# Patient Record
Sex: Male | Born: 1945 | Race: White | Hispanic: No | Marital: Married | Smoking: Never smoker
Health system: Southern US, Community
[De-identification: ages and names within clinical notes are randomized; demographics above are authoritative.]

## PROBLEM LIST (undated history)

## (undated) DIAGNOSIS — Z87442 Personal history of urinary calculi: Secondary | ICD-10-CM

## (undated) DIAGNOSIS — I2699 Other pulmonary embolism without acute cor pulmonale: Secondary | ICD-10-CM

## (undated) DIAGNOSIS — I729 Aneurysm of unspecified site: Secondary | ICD-10-CM

## (undated) DIAGNOSIS — I719 Aortic aneurysm of unspecified site, without rupture: Secondary | ICD-10-CM

## (undated) DIAGNOSIS — N289 Disorder of kidney and ureter, unspecified: Secondary | ICD-10-CM

## (undated) DIAGNOSIS — K219 Gastro-esophageal reflux disease without esophagitis: Secondary | ICD-10-CM

## (undated) DIAGNOSIS — G8929 Other chronic pain: Secondary | ICD-10-CM

## (undated) DIAGNOSIS — N400 Enlarged prostate without lower urinary tract symptoms: Secondary | ICD-10-CM

## (undated) DIAGNOSIS — F32A Depression, unspecified: Secondary | ICD-10-CM

## (undated) DIAGNOSIS — R0789 Other chest pain: Secondary | ICD-10-CM

## (undated) DIAGNOSIS — G4730 Sleep apnea, unspecified: Secondary | ICD-10-CM

## (undated) DIAGNOSIS — G2581 Restless legs syndrome: Secondary | ICD-10-CM

## (undated) DIAGNOSIS — M1990 Unspecified osteoarthritis, unspecified site: Secondary | ICD-10-CM

## (undated) DIAGNOSIS — R600 Localized edema: Secondary | ICD-10-CM

## (undated) DIAGNOSIS — F419 Anxiety disorder, unspecified: Secondary | ICD-10-CM

## (undated) DIAGNOSIS — E291 Testicular hypofunction: Secondary | ICD-10-CM

## (undated) DIAGNOSIS — I82409 Acute embolism and thrombosis of unspecified deep veins of unspecified lower extremity: Secondary | ICD-10-CM

## (undated) DIAGNOSIS — G4700 Insomnia, unspecified: Secondary | ICD-10-CM

## (undated) DIAGNOSIS — E785 Hyperlipidemia, unspecified: Secondary | ICD-10-CM

## (undated) DIAGNOSIS — R0602 Shortness of breath: Secondary | ICD-10-CM

## (undated) DIAGNOSIS — I10 Essential (primary) hypertension: Secondary | ICD-10-CM

## (undated) DIAGNOSIS — F329 Major depressive disorder, single episode, unspecified: Secondary | ICD-10-CM

## (undated) HISTORY — DX: Shortness of breath: R06.02

## (undated) HISTORY — DX: Hyperlipidemia, unspecified: E78.5

## (undated) HISTORY — DX: Disorder of kidney and ureter, unspecified: N28.9

## (undated) HISTORY — PX: INGUINAL HERNIA REPAIR: SUR1180

## (undated) HISTORY — DX: Benign prostatic hyperplasia without lower urinary tract symptoms: N40.0

## (undated) HISTORY — PX: KIDNEY STONE SURGERY: SHX686

## (undated) HISTORY — PX: ROTATOR CUFF REPAIR: SHX139

## (undated) HISTORY — DX: Gastro-esophageal reflux disease without esophagitis: K21.9

## (undated) HISTORY — DX: Essential (primary) hypertension: I10

## (undated) HISTORY — DX: Localized edema: R60.0

## (undated) HISTORY — DX: Testicular hypofunction: E29.1

## (undated) HISTORY — DX: Other chest pain: R07.89

## (undated) HISTORY — DX: Insomnia, unspecified: G47.00

---
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
## 2008-12-01 ENCOUNTER — Ambulatory Visit (HOSPITAL_COMMUNITY): Admission: RE | Admit: 2008-12-01 | Discharge: 2008-12-01 | Payer: Self-pay | Admitting: Urology

## 2011-08-23 ENCOUNTER — Other Ambulatory Visit (HOSPITAL_COMMUNITY): Payer: Self-pay | Admitting: Urology

## 2011-08-23 ENCOUNTER — Ambulatory Visit (HOSPITAL_COMMUNITY)
Admission: RE | Admit: 2011-08-23 | Discharge: 2011-08-23 | Disposition: A | Discharge status: Home / Self Care | Payer: BC Managed Care – PPO | Source: Ambulatory Visit | Attending: Urology | Admitting: Urology

## 2011-08-23 DIAGNOSIS — R1031 Right lower quadrant pain: Secondary | ICD-10-CM | POA: Insufficient documentation

## 2011-08-23 DIAGNOSIS — R109 Unspecified abdominal pain: Secondary | ICD-10-CM

## 2016-03-15 DIAGNOSIS — M25473 Effusion, unspecified ankle: Secondary | ICD-10-CM

## 2016-03-15 DIAGNOSIS — R0602 Shortness of breath: Secondary | ICD-10-CM | POA: Insufficient documentation

## 2016-03-15 DIAGNOSIS — I10 Essential (primary) hypertension: Secondary | ICD-10-CM | POA: Insufficient documentation

## 2016-03-15 DIAGNOSIS — R0789 Other chest pain: Secondary | ICD-10-CM | POA: Insufficient documentation

## 2016-03-20 ENCOUNTER — Ambulatory Visit (INDEPENDENT_AMBULATORY_CARE_PROVIDER_SITE_OTHER): Payer: Medicare Other | Admitting: Cardiovascular Disease

## 2016-03-20 ENCOUNTER — Encounter: Payer: Self-pay | Admitting: Cardiovascular Disease

## 2016-03-20 VITALS — BP 130/78 | HR 74 | Ht 68.0 in | Wt 189.0 lb

## 2016-03-20 DIAGNOSIS — M79605 Pain in left leg: Secondary | ICD-10-CM | POA: Diagnosis not present

## 2016-03-20 DIAGNOSIS — R0789 Other chest pain: Secondary | ICD-10-CM | POA: Diagnosis not present

## 2016-03-20 DIAGNOSIS — R0600 Dyspnea, unspecified: Secondary | ICD-10-CM

## 2016-03-20 DIAGNOSIS — R600 Localized edema: Secondary | ICD-10-CM | POA: Diagnosis not present

## 2016-03-20 NOTE — Progress Notes (Addendum)
Cardiology Office Note    Date:  03/20/2016   ID:  TYTAN GARVEN, DOB 1946-04-12, MRN [id number]  PCP:  Ephriam Jenkins E  Cardiologist:   Jenkins Rouge, MD   No chief complaint on file.   History of Present Illness:  Adrian Franco is a 70 y.o. male referred for chest pain.  Seen by primary 03/13/16 complained of fatigue , dyspnea and atypical chest pain.  For 3 months less energy. Getting testosterone shots levels not checked recently but shots every 2 weeks. Dyspnea going up steps ? 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 Pain in center of chest Not always exertional Lasts minutes Occurs 3-4 x/week.  Both parents have CAD with CABG.  Also sees Dr Jerry Caras for back problems.  Has never has stress test or cardiac w/u   Pain and swelling in LLE with band like cessation behind knee No antecedent claudication. Some cramps in legs     Past Medical History:  Diagnosis Date  . Atypical chest pain   . BPH (benign prostatic hypertrophy)   . GERD (gastroesophageal reflux disease)   . HTN (hypertension)   . Hyperlipemia   . Hypogonadism in male   . Insomnia   . Localized edema   . Renal insufficiency   . SOB (shortness of breath)   . Testicular hypofunction     Past Surgical History:  Procedure Laterality Date  . INGUINAL HERNIA REPAIR      Current Medications: Outpatient Medications Prior to Visit  Medication Sig Dispense Refill  . fenofibrate 160 MG tablet Take 160 mg by mouth daily.    Marland Kitchen losartan (COZAAR) 100 MG tablet Take 100 mg by mouth daily.    Marland Kitchen omeprazole (PRILOSEC) 40 MG capsule Take 40 mg by mouth daily.    . ropinirole (REQUIP) 5 MG tablet Take 5 mg by mouth at bedtime.    . simvastatin (ZOCOR) 20 MG tablet Take 20 mg by mouth daily.    . tamsulosin (FLOMAX) 0.4 MG CAPS capsule Take 0.4 mg by mouth.    . testosterone cypionate (DEPO-TESTOSTERONE) 200 MG/ML injection Inject into the muscle every 14 (fourteen) days.    . traMADol (ULTRAM) 50 MG tablet  Take 50 mg by mouth every 8 (eight) hours as needed.    . zolpidem (AMBIEN) 10 MG tablet Take 10 mg by mouth at bedtime as needed for sleep.    . meloxicam (MOBIC) 7.5 MG tablet Take 7.5 mg by mouth daily.    . sucralfate (CARAFATE) 1 g tablet Take 1 g by mouth 4 (four) times daily -  with meals and at bedtime.     No facility-administered medications prior to visit.      Allergies:   Amoxapine and related and Lotensin [benazepril hcl]   Social History   Social History  . Marital status: Married    Spouse name: N/A  . Number of children: N/A  . Years of education: N/A   Social History Main Topics  . Smoking status: Never Smoker  . Smokeless tobacco: Never Used  . Alcohol use No  . Drug use: No  . Sexual activity: Yes     Comment: married   Other Topics Concern  . None   Social History Narrative  . None     Family History:  The patient's family history includes Healthy in his brother, sister, son, and son; Hyperlipidemia in his father and mother; Other in his brother.   ROS:   Please see  the history of present illness.    ROS All other systems reviewed and are negative.   PHYSICAL EXAM:   VS:  BP 130/78   Pulse 74   Ht 5\' 8"  (1.727 m)   Wt 189 lb (85.7 kg)   SpO2 96%   BMI 28.74 kg/m    GEN: Well nourished, well developed, in no acute distress  HEENT: normal  Neck: no JVD, carotid bruits, or masses Cardiac: RRR; no murmurs, rubs, or gallops,no edema  Respiratory:  clear to auscultation bilaterally, normal work of breathing GI: soft, nontender, nondistended, + BS MS: no deformity or atrophy  Skin: warm and dry, no rash Neuro:  Alert and Oriented x 3, Strength and sensation are intact Psych: euthymic mood, full affect Plus one LLE edema with varicosities and fullness popliteal space   Wt Readings from Last 3 Encounters:  03/20/16 189 lb (85.7 kg)      Studies/Labs Reviewed:   EKG:   SR Insignificatn Q 3,F otherwise normal   Recent Labs: No results  found for requested labs within last 8760 hours.   Lipid Panel No results found for: CHOL, TRIG, HDL, CHOLHDL, VLDL, LDLCALC, LDLDIRECT  Additional studies/ records that were reviewed today include:  Notes from primary in [place] lasbs and CXR    ASSESSMENT:    1. Other chest pain   2. Acute pain of left lower extremity   3. Dyspnea   4. Edema of left lower extremity      PLAN:  In order of problems listed above:  1. Chest Pain  Atypical ECG ok f/u exercise myovue 2. Dyspnea  Normal exam f/u echo for RV/LV function estimate PA pressures 3. Low T f/u lab work primary continue supplements 4. HTN:  nlbp 5. CRF has stopped meloxicam wife will get [place] recent labs 6. Edema:  LLE check venous duplex r/o DVT venous insufficiency and bakers cyst    Medication Adjustments/Labs and Tests Ordered: Current medicines are reviewed at length with the patient today.  Concerns regarding medicines are outlined above.  Medication changes, Labs and Tests ordered today are listed in the Patient Instructions below. Patient Instructions  Medication Instructions:  Your physician recommends that you continue on your current medications as directed. Please refer to the Current Medication list given to you today.  Labwork: NONE  Testing/Procedures: Your physician has requested that you have en exercise stress myoview. For further information please visit [email or web address][email or web address]. Please follow instruction sheet, as given.  Your physician has requested that you have an echocardiogram. Echocardiography is a painless test that uses sound waves to create images of your heart. It provides your doctor with information about the size and shape of your heart and how well your heart's chambers and valves are working. This procedure takes approximately one hour. There are no restrictions for this procedure.  Your physician has requested that you have a lower extremity duplex to rule out DVT. Allow one hour for  this exam. There are no restrictions or special instructions.   Follow-Up: Your physician wants you to follow-up next available after test complete with [name].    If you need a refill on your cardiac medications before your next appointment, please call your pharmacy.     Reviewed labs from [place]:  Done 07/07/15 Cr was 1.48 with BUN 32  K 5.2 LDL 70 TC 162  Testosterone 210 (LLN 348)   CXR NAD   F/U labs 03/13/16 Cr 1.69 with BUN 24  K 4.8  LDL 102 TC 178 normal LFTls  Urine protein negative   Signed, Jenkins Rouge, MD  03/20/2016 5:05 PM    [facility] [address], [place], [place]  [address] Phone: [phone]; Fax: [phone]

## 2016-03-20 NOTE — Patient Instructions (Signed)
Medication Instructions:  Your physician recommends that you continue on your current medications as directed. Please refer to the Current Medication list given to you today.  Labwork: NONE  Testing/Procedures: Your physician has requested that you have en exercise stress myoview. For further information please visit [email or web address]. Please follow instruction sheet, as given.  Your physician has requested that you have an echocardiogram. Echocardiography is a painless test that uses sound waves to create images of your heart. It provides your doctor with information about the size and shape of your heart and how well your heart's chambers and valves are working. This procedure takes approximately one hour. There are no restrictions for this procedure.  Your physician has requested that you have a lower extremity duplex to rule out DVT. Allow one hour for this exam. There are no restrictions or special instructions.   Follow-Up: Your physician wants you to follow-up next available after test complete with [name].    If you need a refill on your cardiac medications before your next appointment, please call your pharmacy.

## 2016-03-21 ENCOUNTER — Encounter: Payer: Self-pay | Admitting: Cardiovascular Disease

## 2016-03-21 ENCOUNTER — Telehealth: Payer: Self-pay | Admitting: Cardiovascular Disease

## 2016-03-21 DIAGNOSIS — I351 Nonrheumatic aortic (valve) insufficiency: Secondary | ICD-10-CM

## 2016-03-27 ENCOUNTER — Inpatient Hospital Stay (HOSPITAL_COMMUNITY): Admission: RE | Admit: 2016-03-27 | Payer: 59 | Source: Ambulatory Visit

## 2016-03-30 ENCOUNTER — Inpatient Hospital Stay (HOSPITAL_COMMUNITY)
Admission: EM | Admit: 2016-03-30 | Discharge: 2016-03-31 | DRG: 176 | Disposition: A | Discharge status: Home / Self Care | Payer: Medicare Other | Attending: Student in an Organized Health Care Education/Training Program | Admitting: Student in an Organized Health Care Education/Training Program

## 2016-03-30 ENCOUNTER — Emergency Department (HOSPITAL_COMMUNITY): Payer: Medicare Other

## 2016-03-30 ENCOUNTER — Ambulatory Visit (HOSPITAL_COMMUNITY)
Admission: RE | Admit: 2016-03-30 | Discharge: 2016-03-30 | Disposition: A | Discharge status: Home / Self Care | Payer: Medicare Other | Source: Ambulatory Visit | Attending: Cardiovascular Disease | Admitting: Cardiovascular Disease

## 2016-03-30 ENCOUNTER — Encounter (HOSPITAL_COMMUNITY): Payer: Self-pay | Admitting: Emergency Medicine

## 2016-03-30 ENCOUNTER — Telehealth: Payer: Self-pay

## 2016-03-30 ENCOUNTER — Other Ambulatory Visit: Payer: Self-pay

## 2016-03-30 DIAGNOSIS — I10 Essential (primary) hypertension: Secondary | ICD-10-CM | POA: Diagnosis present

## 2016-03-30 DIAGNOSIS — Z7982 Long term (current) use of aspirin: Secondary | ICD-10-CM

## 2016-03-30 DIAGNOSIS — I82409 Acute embolism and thrombosis of unspecified deep veins of unspecified lower extremity: Secondary | ICD-10-CM

## 2016-03-30 DIAGNOSIS — M79605 Pain in left leg: Secondary | ICD-10-CM

## 2016-03-30 DIAGNOSIS — R0602 Shortness of breath: Secondary | ICD-10-CM | POA: Diagnosis present

## 2016-03-30 DIAGNOSIS — K219 Gastro-esophageal reflux disease without esophagitis: Secondary | ICD-10-CM | POA: Diagnosis present

## 2016-03-30 DIAGNOSIS — I82402 Acute embolism and thrombosis of unspecified deep veins of left lower extremity: Secondary | ICD-10-CM | POA: Diagnosis present

## 2016-03-30 DIAGNOSIS — Z79899 Other long term (current) drug therapy: Secondary | ICD-10-CM | POA: Diagnosis not present

## 2016-03-30 DIAGNOSIS — N400 Enlarged prostate without lower urinary tract symptoms: Secondary | ICD-10-CM | POA: Diagnosis present

## 2016-03-30 DIAGNOSIS — R600 Localized edema: Secondary | ICD-10-CM

## 2016-03-30 DIAGNOSIS — E785 Hyperlipidemia, unspecified: Secondary | ICD-10-CM | POA: Diagnosis present

## 2016-03-30 DIAGNOSIS — Z8249 Family history of ischemic heart disease and other diseases of the circulatory system: Secondary | ICD-10-CM

## 2016-03-30 DIAGNOSIS — Z888 Allergy status to other drugs, medicaments and biological substances status: Secondary | ICD-10-CM | POA: Diagnosis not present

## 2016-03-30 DIAGNOSIS — G4700 Insomnia, unspecified: Secondary | ICD-10-CM | POA: Diagnosis present

## 2016-03-30 DIAGNOSIS — Z6828 Body mass index (BMI) 28.0-28.9, adult: Secondary | ICD-10-CM | POA: Diagnosis not present

## 2016-03-30 DIAGNOSIS — I2699 Other pulmonary embolism without acute cor pulmonale: Secondary | ICD-10-CM | POA: Diagnosis present

## 2016-03-30 DIAGNOSIS — R634 Abnormal weight loss: Secondary | ICD-10-CM | POA: Diagnosis present

## 2016-03-30 LAB — BASIC METABOLIC PANEL
Anion gap: 9 (ref 5–15)
BUN: 21 mg/dL — ABNORMAL HIGH (ref 6–20)
CO2: 28 mmol/L (ref 22–32)
Calcium: 10.3 mg/dL (ref 8.9–10.3)
Chloride: 100 mmol/L — ABNORMAL LOW (ref 101–111)
Creatinine, Ser: 1.39 mg/dL — ABNORMAL HIGH (ref 0.61–1.24)
GFR calc Af Amer: 58 mL/min — ABNORMAL LOW (ref >60)
GFR calc non Af Amer: 50 mL/min — ABNORMAL LOW (ref >60)
Glucose, Bld: 92 mg/dL (ref 65–99)
Potassium: 4.5 mmol/L (ref 3.5–5.1)
Sodium: 137 mmol/L (ref 135–145)

## 2016-03-30 LAB — CBC
HCT: 46.6 % (ref 39.0–52.0)
Hemoglobin: 15 g/dL (ref 13.0–17.0)
MCH: 29.5 pg (ref 26.0–34.0)
MCHC: 32.2 g/dL (ref 30.0–36.0)
MCV: 91.6 fL (ref 78.0–100.0)
Platelets: 339 10*3/uL (ref 150–400)
RBC: 5.09 MIL/uL (ref 4.22–5.81)
RDW: 12.9 % (ref 11.5–15.5)
WBC: 8.5 10*3/uL (ref 4.0–10.5)

## 2016-03-30 LAB — I-STAT TROPONIN, ED: Troponin i, poc: 0 ng/mL (ref 0.00–0.08)

## 2016-03-30 LAB — PSA: PSA: 4.59 ng/mL — ABNORMAL HIGH (ref 0.00–4.00)

## 2016-03-30 MED ORDER — IOPAMIDOL (ISOVUE-370) INJECTION 76%
INTRAVENOUS | Status: AC
  Administered 2016-03-30: 70 mL
  Filled 2016-03-30: qty 100

## 2016-03-30 MED ORDER — TAMSULOSIN HCL 0.4 MG PO CAPS
0.4000 mg | ORAL_CAPSULE | Freq: Two times a day (BID) | ORAL | Status: DC
  Administered 2016-03-30: 0.4 mg via ORAL
  Filled 2016-03-30: qty 1

## 2016-03-30 MED ORDER — ALUM & MAG HYDROXIDE-SIMETH 200-200-20 MG/5ML PO SUSP: 15.0000 mL | Freq: Every evening | ORAL | Status: DC | PRN

## 2016-03-30 MED ORDER — SIMVASTATIN 20 MG PO TABS
20.0000 mg | ORAL_TABLET | Freq: Every day | ORAL | Status: DC
  Administered 2016-03-30: 20 mg via ORAL
  Filled 2016-03-30 (×2): qty 1

## 2016-03-30 MED ORDER — ZOLPIDEM TARTRATE 5 MG PO TABS
5.0000 mg | ORAL_TABLET | Freq: Every evening | ORAL | Status: DC | PRN
  Administered 2016-03-31: 5 mg via ORAL
  Filled 2016-03-30: qty 1

## 2016-03-30 MED ORDER — ONDANSETRON HCL 4 MG/2ML IJ SOLN: 4.0000 mg | Freq: Three times a day (TID) | INTRAMUSCULAR | Status: AC | PRN

## 2016-03-30 MED ORDER — ZOLPIDEM TARTRATE 5 MG PO TABS: 10.0000 mg | ORAL_TABLET | Freq: Every evening | ORAL | Status: DC | PRN

## 2016-03-30 MED ORDER — ENOXAPARIN SODIUM 100 MG/ML ~~LOC~~ SOLN
1.0000 mg/kg | Freq: Once | SUBCUTANEOUS | Status: AC
  Administered 2016-03-30: 85 mg via SUBCUTANEOUS
  Filled 2016-03-30: qty 1

## 2016-03-30 MED ORDER — ENOXAPARIN SODIUM 100 MG/ML ~~LOC~~ SOLN
85.0000 mg | Freq: Two times a day (BID) | SUBCUTANEOUS | Status: DC
  Administered 2016-03-31: 85 mg via SUBCUTANEOUS
  Filled 2016-03-30: qty 1

## 2016-03-30 MED ORDER — ASPIRIN EC 81 MG PO TBEC
81.0000 mg | DELAYED_RELEASE_TABLET | Freq: Every day | ORAL | Status: DC
  Filled 2016-03-30: qty 1

## 2016-03-30 NOTE — ED Notes (Signed)
Per [facility] heart care.   Patient to have CT to determine PE.  Patient positive for multiple dvt's in LLE.

## 2016-03-30 NOTE — ED Provider Notes (Signed)
[facility] DEPT Provider Note   CSN: [id number] Arrival date & time: 03/30/16  1232  First Provider Contact:  First MD Initiated Contact with Patient 03/30/16 1455        History   Chief Complaint Chief Complaint  Patient presents with  . DVT  . Shortness of Breath    HPI Adrian Franco is a 70 y.o. male.  The patient is a 70 year old male, he has no prior history of cardiac disease though he does have hypertension and hyperlipidemia. He is known to have some renal insufficiency as well. He has had approximately one month of left-sided chest pain and shortness of breath, he gets very weak on exertion and has had some worsening swelling of his left lower extremity. He was referred by his family doctor in [place] to the cardiologist here in [place] who ordered a DVT study of his lower extremity today. It came back positive and it was referred that he come to the emergency department for evaluation of potential pulmonary embolism. At this time the patient does not have any chest pain or shortness of breath while he is resting. He states that it is usually located on the left side of the chest. His swelling in his leg tends to go up and down throughout the day worse as the day goes on. He was told that his DVT study was positive      Past Medical History:  Diagnosis Date  . Atypical chest pain   . BPH (benign prostatic hypertrophy)   . GERD (gastroesophageal reflux disease)   . HTN (hypertension)   . Hyperlipemia   . Hypogonadism in male   . Insomnia   . Localized edema   . Renal insufficiency   . SOB (shortness of breath)   . Testicular hypofunction     Patient Active Problem List   Diagnosis Date Noted  . Swelling of ankle 03/15/2016  . HTN (hypertension)   . Atypical chest pain   . SOB (shortness of breath)     Past Surgical History:  Procedure Laterality Date  . INGUINAL HERNIA REPAIR         Home Medications    Prior to Admission  medications   Medication Sig Start Date End Date Taking? Authorizing Provider  fenofibrate 160 MG tablet Take 160 mg by mouth daily.    Historical Provider, MD  losartan (COZAAR) 100 MG tablet Take 100 mg by mouth daily.    Historical Provider, MD  omeprazole (PRILOSEC) 40 MG capsule Take 40 mg by mouth daily.    Historical Provider, MD  ropinirole (REQUIP) 5 MG tablet Take 5 mg by mouth at bedtime.    Historical Provider, MD  simvastatin (ZOCOR) 20 MG tablet Take 20 mg by mouth daily.    Historical Provider, MD  tamsulosin (FLOMAX) 0.4 MG CAPS capsule Take 0.4 mg by mouth.    Historical Provider, MD  testosterone cypionate (DEPO-TESTOSTERONE) 200 MG/ML injection Inject into the muscle every 14 (fourteen) days.    Historical Provider, MD  traMADol (ULTRAM) 50 MG tablet Take 50 mg by mouth every 8 (eight) hours as needed.    Historical Provider, MD  zolpidem (AMBIEN) 10 MG tablet Take 10 mg by mouth at bedtime as needed for sleep.    Historical Provider, MD    Family History Family History  Problem Relation Age of Onset  . Hyperlipidemia Mother   . Hyperlipidemia Father   . Healthy Son   . Healthy Brother   .  Other Brother   . Healthy Sister   . Healthy Son     Social History Social History  Substance Use Topics  . Smoking status: Never Smoker  . Smokeless tobacco: Never Used  . Alcohol use No     Allergies   Amoxapine and related and Lotensin [benazepril hcl]   Review of Systems Review of Systems  All other systems reviewed and are negative.    Physical Exam Updated Vital Signs BP 145/76   Pulse (!) 56   Temp 98.3 F (36.8 C) (Oral)   Resp 15   Ht 5\' 8"  (1.727 m)   Wt 189 lb 3.2 oz (85.8 kg)   SpO2 97%   BMI 28.77 kg/m   Physical Exam  Constitutional: He appears well-developed and well-nourished. No distress.  HENT:  Head: Normocephalic and atraumatic.  Mouth/Throat: Oropharynx is clear and moist. No oropharyngeal exudate.  Eyes: Conjunctivae and EOM are  normal. Pupils are equal, round, and reactive to light. Right eye exhibits no discharge. Left eye exhibits no discharge. No scleral icterus.  Neck: Normal range of motion. Neck supple. No JVD present. No thyromegaly present.  Cardiovascular: Normal rate, regular rhythm, normal heart sounds and intact distal pulses.  Exam reveals no gallop and no friction rub.   No murmur heard. Pulmonary/Chest: Effort normal and breath sounds normal. No respiratory distress. He has no wheezes. He has no rales.  Abdominal: Soft. Bowel sounds are normal. He exhibits no distension and no mass. There is no tenderness.  Musculoskeletal: Normal range of motion. He exhibits edema and tenderness.  The patient has pitting edema to the left lower extremity which is asymmetrical and slightly larger than the right lower extremity, normal range of motion of the joints, normal pulses  Lymphadenopathy:    He has no cervical adenopathy.  Neurological: He is alert. Coordination normal.  Skin: Skin is warm and dry. No rash noted. No erythema.  Psychiatric: He has a normal mood and affect. His behavior is normal.  Nursing note and vitals reviewed.    ED Treatments / Results  Labs (all labs ordered are listed, but only abnormal results are displayed) Labs Reviewed  BASIC METABOLIC PANEL - Abnormal; Notable for the following:       Result Value   Chloride 100 (*)    BUN 21 (*)    Creatinine, Ser 1.39 (*)    GFR calc non Af Amer 50 (*)    GFR calc Af Amer 58 (*)    All other components within normal limits  CBC  I-STAT TROPOININ, ED    EKG  EKG Interpretation  Date/Time:  Friday March 30 2016 12:42:11 EDT Ventricular Rate:  60 PR Interval:  196 QRS Duration: 90 QT Interval:  392 QTC Calculation: 392 R Axis:   2 Text Interpretation:  Normal sinus rhythm Minimal voltage criteria for LVH, may be normal variant Abnormal ECG No old tracing to compare Confirmed by MILLER  MD, BRIAN ([id number]) on 03/30/2016 2:56:18 PM        Radiology Dg Chest 2 View  Result Date: 03/30/2016 CLINICAL DATA:  Shortness of breath. EXAM: CHEST  2 VIEW COMPARISON:  Two-view chest x-ray 04/12/2009 FINDINGS: The heart size is normal. Mild interstitial coarsening is chronic. No focal airspace disease present. Emphysema is noted. There is no edema or effusion to suggest failure. The visualized soft tissues and bony thorax are unremarkable. IMPRESSION: 1. No acute cardiopulmonary disease. 2. Emphysema. Electronically Signed   By: Wynetta Fines.D.  On: 03/30/2016 13:08   Ct Angio Chest Pe W Or Wo Contrast  Result Date: 03/30/2016 CLINICAL DATA:  Chest pain and shortness of breath.  Known DVT. EXAM: CT ANGIOGRAPHY CHEST WITH CONTRAST TECHNIQUE: Multidetector CT imaging of the chest was performed using the standard protocol during bolus administration of intravenous contrast. Multiplanar CT image reconstructions and MIPs were obtained to evaluate the vascular anatomy. CONTRAST:  70 cc Isovue 370 COMPARISON:  Chest radiographs obtained earlier today. Abdomen pelvis CT dated 09/05/2015 and 11/03/2008. Lumbar spine MR dated 08/04/2015. FINDINGS: Mediastinum/Lymph Nodes: Several small left lower lobe pulmonary arterial filling defects. Several small linear filling defects in the right lower lobe pulmonary arteries. No enlarged lymph nodes. The right ventricular to left ventricular ratio is 1.08. There is also bowing of the interventricular septum to the left. Lungs/Pleura: No pulmonary mass, infiltrate, or effusion. Upper abdomen: Atheromatous aortic calcification. Oval, circumscribed mass arising from the superior aspect of the upper pole of the left kidney. This measures 2.7 x 2.4 x 2.2 cm in maximum dimensions and 21 Hounsfield units in density. This measured 13 Hounsfield units in density on 09/05/2015 with little change in size. The included portions appear cystic on the previous MR. Musculoskeletal: Thoracic and lower cervical spine  degenerative changes. Review of the MIP images confirms the above findings. IMPRESSION: 1. Several small acute left lower lobe pulmonary emboli. 2. Several small resolving right lower lobe pulmonary emboli. 3. CT evidence of right heart strain (RV/LV Ratio = 1.08) consistent with at least submassive (intermediate risk) PE. The presence of right heart strain has been associated with an increased risk of morbidity and mortality. Please activate Code PE by paging [phone]. 4. No significant change in a mildly complicated upper pole left renal cyst. 5. Aortic atherosclerosis. Critical Value/emergent results were called by telephone at the time of interpretation on 03/30/2016 at 4:57 pm to Dr. Noemi Chapel , who verbally acknowledged these results. Electronically Signed   By: Claudie Revering M.D.   On: 03/30/2016 17:00    Procedures Procedures (including critical care time)  Medications Ordered in ED Medications  enoxaparin (LOVENOX) injection 85 mg (85 mg Subcutaneous Given 03/30/16 1558)  iopamidol (ISOVUE-370) 76 % injection (70 mLs  Contrast Given 03/30/16 1622)     Initial Impression / Assessment and Plan / ED Course  I have reviewed the triage vital signs and the nursing notes.  Pertinent labs & imaging results that were available during my care of the patient were reviewed by me and considered in my medical decision making (see chart for details).  Clinical Course    Labs reviewed, the patient has evidence of DVT on clinical exam, chest x-rays unremarkable except for some emphysema, labs are unremarkable except for renal insufficiency, CT angiogram ordered. The patient will need anticoagulants.  Discussed the case with the radiologist, there is findings of bilateral pulmonary embolism, they also state that there is findings of right heart strain. The patient has been given a shot of subcutaneous Lovenox. We'll discuss with the admitting team. I have discussed the care with the cardiologist, they  do not feel that this case needs to go to cardiology service. I agreed this can be handled on the internal medicine service  D/w internal medicine resident - will admit  lovenox given.  Final Clinical Impressions(s) / ED Diagnoses   Final diagnoses:  None    New Prescriptions New Prescriptions   No medications on file     Noemi Chapel, MD 03/30/16 1723

## 2016-03-30 NOTE — ED Notes (Signed)
Pt transported to CT ?

## 2016-03-30 NOTE — ED Triage Notes (Signed)
Pt sent here for left leg pain and swelling x 2 weeks; pt diagnosed with DVT; pt sent here for eval for PE due to SOB

## 2016-03-30 NOTE — ED Notes (Signed)
EKG done in triage

## 2016-03-30 NOTE — Progress Notes (Signed)
Patient arrived to unit and placed on telemetry.  First and second verification called.  Patient oriented to unit and will call if needs. Aware of shift change and importance of calling RN/NT directly. Pt resting with call bell within reach.  Will continue to monitor. Payton Emerald, RN

## 2016-03-30 NOTE — H&P (Signed)
Date: 03/30/2016               Patient Name:  GABRIELL LACHNEY MRN: [id number]  DOB: 1945-10-16 Age / Sex: 70 y.o., male   PCP: [place] Service: Internal Medicine Teaching Service         Attending Physician: Dr. Axel Filler, MD    First Contact: Dr. Holley Raring Pager: [phone]  Second Contact: Dr. Charlott Rakes Pager: [phone]       After Hours (After 5p/  First Contact Pager: [phone]  weekends / holidays): Second Contact Pager: [phone]   Chief Complaint: PE  History of Present Illness: Mr. STEFFEN DOHN is a 70 y.o. male with a h/o of HTN, HLD, BPH who presents with left leg DVTs and sub-massive PEs found on LE [place] and CTA.  Pt first began to notice fatigue and SOB on around the beginning of July. Around the same time he reports experiencing some left leg cramps. The cramps continued to worsen and he noticed unilateral left leg edema. He presented to his PCP who referred him to his Cardiologist who scheduled him for stress test and LE [place]. He was found to have DVTs on LE [place] and sent to [facility] for CTA where he was diagnosed w/ submassive multifocal PEs, some resolving and some acute.  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 He denies recent trauma/immobilization. Denies smoking. Denies any h/o malignancy. Pt does endorse a recent 6lbs unintentional wt loss over the last several months. Denies fevers, but endorses cold extremities x 1 month.  Pt denies CP, changes to BM, abd pain, urinary symptoms, tremors, anxiety, palpitations.  Meds: Medications Prior to Admission  Medication Sig Dispense Refill  . acetaminophen (TYLENOL) 500 MG tablet Take 500-1,000 mg by mouth every 6 (six) hours as needed for mild pain or moderate pain.    Marland Kitchen aluminum-magnesium hydroxide-simethicone (MAALOX) [id number] MG/5ML SUSP Take 15-30 mLs by mouth at bedtime as needed (for  indigestion/heartburn).     Marland Kitchen aspirin EC 81 MG tablet Take 81 mg by mouth daily.    Marland Kitchen CALCIUM PO Take 1 tablet by mouth daily.    Marland Kitchen docusate sodium (COLACE) 100 MG capsule Take 100 mg by mouth daily as needed for mild constipation.    . fenofibrate 160 MG tablet Take 160 mg by mouth daily.    Marland Kitchen losartan (COZAAR) 100 MG tablet Take 100 mg by mouth daily.    . magnesium oxide (MAG-OX) 400 MG tablet Take 400 mg by mouth every morning.    . Multiple Vitamins-Minerals (ONE-A-DAY MENS 50+ ADVANTAGE) TABS Take 1 tablet by mouth daily with breakfast.    . nitroGLYCERIN (NITROSTAT) 0.4 MG SL tablet Place 0.4 mg under the tongue every 5 (five) minutes as needed. For chest pain    . omeprazole (PRILOSEC) 40 MG capsule Take 40 mg by mouth daily.    . ropinirole (REQUIP) 5 MG tablet Take 5 mg by mouth at bedtime.    . simvastatin (ZOCOR) 20 MG tablet Take 20 mg by mouth daily.    . tamsulosin (FLOMAX) 0.4 MG CAPS capsule Take 0.4 mg by mouth 2 (two) times daily.     Marland Kitchen testosterone cypionate (DEPO-TESTOSTERONE) 200 MG/ML injection Inject into the muscle every 14 (fourteen) days.    . traMADol (ULTRAM) 50 MG tablet Take 50  mg by mouth every 8 (eight) hours as needed.    . zolpidem (AMBIEN) 10 MG tablet Take 10 mg by mouth at bedtime as needed for sleep.     Allergies: Allergies as of 03/30/2016 - Review Complete 03/30/2016  Allergen Reaction Noted  . Amoxapine and related Hives and Rash 03/15/2016  . Lotensin [benazepril hcl] Hives and Rash 03/15/2016   Past Medical History:  Diagnosis Date  . Atypical chest pain   . BPH (benign prostatic hypertrophy)   . GERD (gastroesophageal reflux disease)   . HTN (hypertension)   . Hyperlipemia   . Hypogonadism in male   . Insomnia   . Localized edema   . Renal insufficiency   . SOB (shortness of breath)   . Testicular hypofunction     Family History: Denies h/o malignancy in his family.  Social History: Pt is retired and lives w/ his wife. He is a  non-smoker.  Review of Systems: A complete ROS was negative except as per HPI. Review of Systems  Constitutional: Positive for malaise/fatigue. Negative for chills, diaphoresis, fever and weight loss.  Eyes: Negative for blurred vision.  Respiratory: Positive for shortness of breath. Negative for cough, hemoptysis and wheezing.   Cardiovascular: Negative for chest pain and leg swelling.  Gastrointestinal: Negative for abdominal pain, blood in stool, constipation, diarrhea, heartburn, melena, nausea and vomiting.  Genitourinary: Negative for dysuria, frequency and urgency.  Musculoskeletal: Positive for myalgias (left leg).  Skin: Negative for rash.  Neurological: Positive for weakness. Negative for dizziness, tremors and headaches.  Endo/Heme/Allergies: Negative for polydipsia.  Psychiatric/Behavioral: The patient is not nervous/anxious.     Physical Exam: Vitals:   03/30/16 1715 03/30/16 1730 03/30/16 1800 03/30/16 1852  BP: 124/84 120/72 150/71 (!) 142/83  Pulse: (!) 53 (!) 53 63 64  Resp: 14 18 19    Temp:      TempSrc:      SpO2: 97% 98% 98% 96%  Weight:      Height:       Physical Exam  Constitutional: He is oriented to person, place, and time. He appears well-developed and well-nourished. He is cooperative. No distress.  HENT:  Head: Normocephalic and atraumatic.  Right Ear: Hearing normal.  Left Ear: Hearing normal.  Nose: Nose normal.  Mouth/Throat: Mucous membranes are normal.  Eyes: Pupils are equal, round, and reactive to light.  Neck: Neck supple. No thyromegaly present.  Cardiovascular: Normal rate, regular rhythm, S1 normal, S2 normal and intact distal pulses.  Exam reveals no gallop.   No murmur heard. Pulses:      Dorsalis pedis pulses are 2+ on the right side, and 1+ on the left side.       Posterior tibial pulses are 2+ on the right side, and 0 on the left side.  Pulmonary/Chest: Effort normal and breath sounds normal. No respiratory distress. He has no  decreased breath sounds. He has no wheezes. He has no rhonchi. He has no rales. He exhibits no tenderness.  Abdominal: Soft. Normal appearance and bowel sounds are normal. He exhibits no ascites. There is no hepatosplenomegaly. There is no tenderness.  Musculoskeletal:       Right upper leg: He exhibits no tenderness and no edema.       Left upper leg: He exhibits tenderness and edema.       Right lower leg: He exhibits no tenderness and no edema.       Left lower leg: He exhibits tenderness and edema.  Lymphadenopathy:  He has no cervical adenopathy.  Neurological: He is alert and oriented to person, place, and time. He has normal strength.  Skin: Skin is warm, dry and intact. He is not diaphoretic.  Psychiatric: He has a normal mood and affect. His speech is normal and behavior is normal.   Labs: CBC:  Recent Labs Lab 03/30/16 1245  WBC 8.5  HGB 15.0  HCT 46.6  MCV 91.6  PLT 99991111   Basic Metabolic Panel:  Recent Labs Lab 03/30/16 1245  NA 137  K 4.5  CL 100*  CO2 28  GLUCOSE 92  BUN 21*  CREATININE 1.39*  CALCIUM 10.3   Cardiac Enzymes:  Recent Labs Lab 03/30/16 1309  TROPIPOC 0.00   EKG: EKG: normal sinus rhythm, left axis deviation.  Imaging: Dg Chest 2 View  Result Date: 03/30/2016 CLINICAL DATA:  Shortness of breath. EXAM: CHEST  2 VIEW COMPARISON:  Two-view chest x-ray 04/12/2009 FINDINGS: The heart size is normal. Mild interstitial coarsening is chronic. No focal airspace disease present. Emphysema is noted. There is no edema or effusion to suggest failure. The visualized soft tissues and bony thorax are unremarkable. IMPRESSION: 1. No acute cardiopulmonary disease. 2. Emphysema. Electronically Signed   By: San Morelle M.D.   On: 03/30/2016 13:08   Ct Angio Chest Pe W Or Wo Contrast  Result Date: 03/30/2016 CLINICAL DATA:  Chest pain and shortness of breath.  Known DVT. EXAM: CT ANGIOGRAPHY CHEST WITH CONTRAST TECHNIQUE: Multidetector CT imaging  of the chest was performed using the standard protocol during bolus administration of intravenous contrast. Multiplanar CT image reconstructions and MIPs were obtained to evaluate the vascular anatomy. CONTRAST:  70 cc Isovue 370 COMPARISON:  Chest radiographs obtained earlier today. Abdomen pelvis CT dated 09/05/2015 and 11/03/2008. Lumbar spine MR dated 08/04/2015. FINDINGS: Mediastinum/Lymph Nodes: Several small left lower lobe pulmonary arterial filling defects. Several small linear filling defects in the right lower lobe pulmonary arteries. No enlarged lymph nodes. The right ventricular to left ventricular ratio is 1.08. There is also bowing of the interventricular septum to the left. Lungs/Pleura: No pulmonary mass, infiltrate, or effusion. Upper abdomen: Atheromatous aortic calcification. Oval, circumscribed mass arising from the superior aspect of the upper pole of the left kidney. This measures 2.7 x 2.4 x 2.2 cm in maximum dimensions and 21 Hounsfield units in density. This measured 13 Hounsfield units in density on 09/05/2015 with little change in size. The included portions appear cystic on the previous MR. Musculoskeletal: Thoracic and lower cervical spine degenerative changes. Review of the MIP images confirms the above findings. IMPRESSION: 1. Several small acute left lower lobe pulmonary emboli. 2. Several small resolving right lower lobe pulmonary emboli. 3. CT evidence of right heart strain (RV/LV Ratio = 1.08) consistent with at least submassive (intermediate risk) PE. The presence of right heart strain has been associated with an increased risk of morbidity and mortality. Please activate Code PE by paging [phone]. 4. No significant change in a mildly complicated upper pole left renal cyst. 5. Aortic atherosclerosis. Critical Value/emergent results were called by telephone at the time of interpretation on 03/30/2016 at 4:57 pm to Dr. Noemi Chapel , who verbally acknowledged these results.  Electronically Signed   By: Claudie Revering M.D.   On: 03/30/2016 17:00    Assessment & Plan by Problem: Principal Problem:   Pulmonary emboli (HCC) Active Problems:   HTN (hypertension)   SOB (shortness of breath)   DVT (deep venous thrombosis) ([place])  Mr. Nery Dastrup  Applewhite is a 70 y.o. male with DVTs and submassive multifocal PEs admitted for anticoagulation.  1) VTE HDS, BP wnl, O2 sats wnl w/o O2 requirement. Appears unprovoked, maybe related to prolonged immobility in tractor, but w/u for malignancy indicated. CXR/CTA w/o evidence of Lung CA. Unknown if c-scope. CBC w/o evidence of leukemia/lymphoma. - Admit to tele - Lovenox therapeutic dose - TTE for eval of RH strain - PSA screen for prostate CA - Trend CBC/BMP  2) BPH: Continue home tamsulosin.  3) HLD: Continue home simvastatin.  4) Insomnia: Continue home ambien.  DVT PPx - low molecular weight heparin therapeutic dose.  Code Status - Full  Consults Placed - none  Dispo: Admit patient to Inpatient with expected length of stay greater than 2 midnights.  Signed: Holley Raring, MD 03/30/2016, 7:32 PM  Pager: [phone]

## 2016-03-30 NOTE — Progress Notes (Signed)
ANTICOAGULATION CONSULT NOTE - Initial Consult  Pharmacy Consult for lovenox Indication: pulmonary embolus and DVT  Allergies  Allergen Reactions  . Amoxapine And Related Hives and Rash  . Lotensin [Benazepril Hcl] Hives and Rash    Patient Measurements: Height: 5\' 8"  (172.7 cm) Weight: 189 lb 3.2 oz (85.8 kg) IBW/kg (Calculated) : 68.4  Vital Signs: Temp: 98.3 F (36.8 C) (08/04 1239) Temp Source: Oral (08/04 1239) BP: 150/71 (08/04 1800) Pulse Rate: 63 (08/04 1800)  Labs:  Recent Labs  03/30/16 1245  HGB 15.0  HCT 46.6  PLT 339  CREATININE 1.39*    Estimated Creatinine Clearance: 53.5 mL/min (by C-G formula based on SCr of 1.39 mg/dL).   Medical History: Past Medical History:  Diagnosis Date  . Atypical chest pain   . BPH (benign prostatic hypertrophy)   . GERD (gastroesophageal reflux disease)   . HTN (hypertension)   . Hyperlipemia   . Hypogonadism in male   . Insomnia   . Localized edema   . Renal insufficiency   . SOB (shortness of breath)   . Testicular hypofunction    Assessment: 56 yom presented to the ED with DVT and SOB. Also found to have a PE. He received a 1x dose of therapeutic lovenox in the ED already. Baseline CBC is WNL. He is not on anticoagulation PTA.   Goal of Therapy:  Anti-Xa level 0.6-1 units/ml 4hrs after LMWH dose given Monitor platelets by anticoagulation protocol: Yes   Plan:  - Lovenox 85mg  SQ Q12H - F/u CBC Q72H - F/u plans for oral AC  Rumbarger, Rande Lawman 03/30/2016,6:39 PM

## 2016-03-30 NOTE — ED Notes (Signed)
This RN consulted with admitting MD and asked if heparin would be administered to patient and she stated no, only lovenox.

## 2016-03-30 NOTE — Telephone Encounter (Signed)
Patient positive for DVT. Sent to ED for CTA of chest to rule out PE. Called ED triage nurse and cardiology PA to informed them patient is on his way.

## 2016-03-31 DIAGNOSIS — I2699 Other pulmonary embolism without acute cor pulmonale: Principal | ICD-10-CM

## 2016-03-31 DIAGNOSIS — I82402 Acute embolism and thrombosis of unspecified deep veins of left lower extremity: Secondary | ICD-10-CM

## 2016-03-31 LAB — BASIC METABOLIC PANEL
Anion gap: 8 (ref 5–15)
BUN: 21 mg/dL — ABNORMAL HIGH (ref 6–20)
CO2: 29 mmol/L (ref 22–32)
Calcium: 10.2 mg/dL (ref 8.9–10.3)
Chloride: 100 mmol/L — ABNORMAL LOW (ref 101–111)
Creatinine, Ser: 1.4 mg/dL — ABNORMAL HIGH (ref 0.61–1.24)
GFR calc Af Amer: 58 mL/min — ABNORMAL LOW (ref >60)
GFR calc non Af Amer: 50 mL/min — ABNORMAL LOW (ref >60)
Glucose, Bld: 108 mg/dL — ABNORMAL HIGH (ref 65–99)
Potassium: 4.6 mmol/L (ref 3.5–5.1)
Sodium: 137 mmol/L (ref 135–145)

## 2016-03-31 LAB — CBC
HCT: 44.7 % (ref 39.0–52.0)
Hemoglobin: 14.3 g/dL (ref 13.0–17.0)
MCH: 29.4 pg (ref 26.0–34.0)
MCHC: 32 g/dL (ref 30.0–36.0)
MCV: 91.8 fL (ref 78.0–100.0)
Platelets: 323 10*3/uL (ref 150–400)
RBC: 4.87 MIL/uL (ref 4.22–5.81)
RDW: 13 % (ref 11.5–15.5)
WBC: 8.4 10*3/uL (ref 4.0–10.5)

## 2016-03-31 MED ORDER — RIVAROXABAN 20 MG PO TABS: 20.0000 mg | ORAL_TABLET | Freq: Every day | ORAL | Status: DC

## 2016-03-31 MED ORDER — ACETAMINOPHEN 325 MG PO TABS
650.0000 mg | ORAL_TABLET | Freq: Four times a day (QID) | ORAL | Status: DC | PRN
  Administered 2016-03-31: 650 mg via ORAL
  Filled 2016-03-31: qty 2

## 2016-03-31 MED ORDER — RIVAROXABAN 15 MG PO TABS: 15.0000 mg | ORAL_TABLET | Freq: Two times a day (BID) | ORAL | Status: DC

## 2016-03-31 MED ORDER — RIVAROXABAN 15 MG PO TABS: 15.0000 mg | ORAL_TABLET | Freq: Two times a day (BID) | ORAL | 0 refills | Status: DC

## 2016-03-31 MED ORDER — RIVAROXABAN 20 MG PO TABS: 20.0000 mg | ORAL_TABLET | Freq: Every day | ORAL | 2 refills | Status: DC

## 2016-03-31 NOTE — Care Management Note (Signed)
Case Management Note  Patient Details  Name: Adrian Franco MRN: [id number] Date of Birth: [birth year]  Subjective/Objective:                  SOB Action/Plan: Discharge planning Expected Discharge Date:                  Expected Discharge Plan:  Home/Self Care  In-House Referral:     Discharge planning Services  CM Consult, Medication Assistance  Post Acute Care Choice:    Choice offered to:  Patient  DME Arranged:  N/A DME Agency:  NA  HH Arranged:  NA HH Agency:  NA  Status of Service:  Completed, signed off  If discussed at [place] of Stay Meetings, dates discussed:    Additional Comments: CM met with pt and gave pt free 30 day trial card for Xarelto.  Pt verbalized understanding this card will pay for today's discharge prescription and give insurance time to authorize medication for refills.  NO other CM needs were communicated. Dellie Catholic, RN 03/31/2016, 9:12 AM

## 2016-03-31 NOTE — Progress Notes (Addendum)
ANTICOAGULATION CONSULT NOTE - Initial  Pharmacy Consult for Xarelto Indication: DVT  Allergies  Allergen Reactions  . Amoxapine And Related Hives and Rash  . Lotensin [Benazepril Hcl] Hives and Rash    Patient Measurements: Height: 5\' 8"  (172.7 cm) Weight: 189 lb 3.2 oz (85.8 kg) IBW/kg (Calculated) : 68.4  Vital Signs: Temp: 98.2 F (36.8 C) (08/05 0254) Temp Source: Oral (08/05 0254) BP: 119/68 (08/05 0254) Pulse Rate: 77 (08/05 0254)  Labs:  Recent Labs  03/30/16 1245 03/31/16 0239  HGB 15.0 14.3  HCT 46.6 44.7  PLT 339 323  CREATININE 1.39* 1.40*    Estimated Creatinine Clearance: 53.1 mL/min (by C-G formula based on SCr of 1.4 mg/dL).   Medical History: Past Medical History:  Diagnosis Date  . Atypical chest pain   . BPH (benign prostatic hypertrophy)   . GERD (gastroesophageal reflux disease)   . HTN (hypertension)   . Hyperlipemia   . Hypogonadism in male   . Insomnia   . Localized edema   . Renal insufficiency   . SOB (shortness of breath)   . Testicular hypofunction     Assessment: 70 yo male admitted with SOB and DVT, PMH include BPH, GERD, HTN, HLD, CKD. Currently anticoagulated with enoxaparin, pharmacy consulted for dosing of Xarelto. Last dose enoxaparin 8/5 at 0600. CrCl using TBW ~60 CBC WNL, stable No s/sx bleeding noted   Goal of Therapy:  Monitor platelets by anticoagulation protocol: Yes   Plan:  D/c lovenox  Start xarelto 15 mg BID tonight (8/5 at 1700) x 21 days, then transition to 20 mg po daily Monitor CBC, s/sx bleeding, clinical picture daily Awaiting case manager consult to determine copay for [place], Pharm.D. PGY1 Pharmacy Resident 8/5/20178:20 AM Pager [phone]

## 2016-03-31 NOTE — Discharge Summary (Signed)
Name: Adrian Franco MRN: [id number] DOB: [birth year] 70 y.o. PCP: Thea Alken  Date of Admission: 03/30/2016  2:27 PM Date of Discharge: 03/31/2016 Attending Physician: No att. providers found  Discharge Diagnosis: Principal Problem:   Pulmonary emboli ([place]) Active Problems:   HTN (hypertension)   SOB (shortness of breath)   DVT (deep venous thrombosis) ([place])   Discharge Medications:   Medication List    STOP taking these medications   aspirin EC 81 MG tablet   DEPO-TESTOSTERONE 200 MG/ML injection Generic drug:  testosterone cypionate     TAKE these medications   acetaminophen 500 MG tablet Commonly known as:  TYLENOL Take 500-1,000 mg by mouth every 6 (six) hours as needed for mild pain or moderate pain.   aluminum-magnesium hydroxide-simethicone [id number] MG/5ML Susp Commonly known as:  MAALOX Take 15-30 mLs by mouth at bedtime as needed (for indigestion/heartburn).   AMBIEN 10 MG tablet Generic drug:  zolpidem Take 10 mg by mouth at bedtime as needed for sleep.   CALCIUM PO Take 1 tablet by mouth daily.   COZAAR 100 MG tablet Generic drug:  losartan Take 100 mg by mouth daily.   docusate sodium 100 MG capsule Commonly known as:  COLACE Take 100 mg by mouth daily as needed for mild constipation.   fenofibrate 160 MG tablet Take 160 mg by mouth daily.   magnesium oxide 400 MG tablet Commonly known as:  MAG-OX Take 400 mg by mouth every morning.   nitroGLYCERIN 0.4 MG SL tablet Commonly known as:  NITROSTAT Place 0.4 mg under the tongue every 5 (five) minutes as needed. For chest pain   omeprazole 40 MG capsule Commonly known as:  PRILOSEC Take 40 mg by mouth daily.   ONE-A-DAY MENS 50+ ADVANTAGE Tabs Take 1 tablet by mouth daily with breakfast.   Rivaroxaban 15 MG Tabs tablet Commonly known as:  XARELTO Take 1 tablet (15 mg total) by mouth 2 (two) times daily with a meal.   rivaroxaban 20 MG Tabs tablet Commonly known as:   XARELTO Take 1 tablet (20 mg total) by mouth daily with supper. Start taking on:  04/21/2016   ropinirole 5 MG tablet Commonly known as:  REQUIP Take 5 mg by mouth at bedtime.   simvastatin 20 MG tablet Commonly known as:  ZOCOR Take 20 mg by mouth daily.   tamsulosin 0.4 MG Caps capsule Commonly known as:  FLOMAX Take 0.4 mg by mouth 2 (two) times daily.   traMADol 50 MG tablet Commonly known as:  ULTRAM Take 50 mg by mouth every 8 (eight) hours as needed.       Disposition and follow-up:   Adrian Franco was discharged from [facility] in Good condition.  At the hospital follow up visit please address:  1.  PE/DVT: f/u SOB and leg cramping symptoms. Access Xarelto compliance and signs of bleeding, discuss ultimate length of therapy for probable "provoked" PE 3 months vs. 6 months  2.  Labs / imaging needed at time of follow-up: none  3.  Pending labs/ test needing follow-up: non3  Follow-up Appointments: PCP  Hospital Course by problem list: Principal Problem:   Pulmonary emboli ([place]) Active Problems:   HTN (hypertension)   SOB (shortness of breath)   DVT (deep venous thrombosis) ([place])   1. Pt had symptoms of left leg cramping and SOB/fatigue for 1 month. He was referred to LE [place] by his cardiologist and found to have several DVTs. He was  sent for CTA and found to have submassive burden of PE. He received therapeutic lovenox and then started on Xarelto for discharge. His DVT is likely provoked given his current testosterone therapy. We have recommended that he discontinue this and have held his ASA during anticoagulation therapy. He should f/u w/ his PCP for determination of the end point of anticoagulation therapy. Patient was HDS and showed no clinical signs of heart strain so an echocardiogram was deferred at this time.  Discharge Vitals:   BP 119/68 (BP Location: Left Arm)   Pulse 77   Temp 98.2 F (36.8 C) (Oral)   Resp 18   Ht 5\' 8"   (1.727 m)   Wt 189 lb 3.2 oz (85.8 kg)   SpO2 97%   BMI 28.77 kg/m   Procedures Performed:  Dg Chest 2 View  Result Date: 03/30/2016 CLINICAL DATA:  Shortness of breath. EXAM: CHEST  2 VIEW COMPARISON:  Two-view chest x-ray 04/12/2009 FINDINGS: The heart size is normal. Mild interstitial coarsening is chronic. No focal airspace disease present. Emphysema is noted. There is no edema or effusion to suggest failure. The visualized soft tissues and bony thorax are unremarkable. IMPRESSION: 1. No acute cardiopulmonary disease. 2. Emphysema. Electronically Signed   By: San Morelle M.D.   On: 03/30/2016 13:08   Ct Angio Chest Pe W Or Wo Contrast  Result Date: 03/30/2016 CLINICAL DATA:  Chest pain and shortness of breath.  Known DVT. EXAM: CT ANGIOGRAPHY CHEST WITH CONTRAST TECHNIQUE: Multidetector CT imaging of the chest was performed using the standard protocol during bolus administration of intravenous contrast. Multiplanar CT image reconstructions and MIPs were obtained to evaluate the vascular anatomy. CONTRAST:  70 cc Isovue 370 COMPARISON:  Chest radiographs obtained earlier today. Abdomen pelvis CT dated 09/05/2015 and 11/03/2008. Lumbar spine MR dated 08/04/2015. FINDINGS: Mediastinum/Lymph Nodes: Several small left lower lobe pulmonary arterial filling defects. Several small linear filling defects in the right lower lobe pulmonary arteries. No enlarged lymph nodes. The right ventricular to left ventricular ratio is 1.08. There is also bowing of the interventricular septum to the left. Lungs/Pleura: No pulmonary mass, infiltrate, or effusion. Upper abdomen: Atheromatous aortic calcification. Oval, circumscribed mass arising from the superior aspect of the upper pole of the left kidney. This measures 2.7 x 2.4 x 2.2 cm in maximum dimensions and 21 Hounsfield units in density. This measured 13 Hounsfield units in density on 09/05/2015 with little change in size. The included portions appear cystic  on the previous MR. Musculoskeletal: Thoracic and lower cervical spine degenerative changes. Review of the MIP images confirms the above findings. IMPRESSION: 1. Several small acute left lower lobe pulmonary emboli. 2. Several small resolving right lower lobe pulmonary emboli. 3. CT evidence of right heart strain (RV/LV Ratio = 1.08) consistent with at least submassive (intermediate risk) PE. The presence of right heart strain has been associated with an increased risk of morbidity and mortality. Please activate Code PE by paging [phone]. 4. No significant change in a mildly complicated upper pole left renal cyst. 5. Aortic atherosclerosis. Critical Value/emergent results were called by telephone at the time of interpretation on 03/30/2016 at 4:57 pm to Dr. Noemi Chapel , who verbally acknowledged these results. Electronically Signed   By: Claudie Revering M.D.   On: 03/30/2016 17:00   Discharge Instructions: Discharge Instructions    Call MD for:  extreme fatigue    Complete by:  As directed   Call MD for:  persistant dizziness or light-headedness  Complete by:  As directed   Call MD for:  temperature >100.4    Complete by:  As directed   Diet - low sodium heart healthy    Complete by:  As directed   Discharge instructions    Complete by:  As directed   Please take your blood thinner medication, Xarelto, as prescribed. You will follow up with your primary doctor in order to discuss the total length of your treatment. Typically, this will be at least 3 months and may be as long as 6 months.  Stop taking your aspirin as this will increase your risk of bleeding while taking Xarelto. We would also like you to stop taking your testosterone as this can increase the risk of blood clots.  You can return to your normal activity as tolerated. You will likely still feel tired and fatigue, but this should improve with time.   Increase activity slowly    Complete by:  As directed      Signed: Holley Raring,  MD 03/31/2016, 4:04 PM   Pager: [phone]

## 2016-03-31 NOTE — Progress Notes (Signed)
   Subjective: Currently, the patient is comfortable w/o complaint. Notes some occasional pain in left leg ON. Eating and drinking well.  Objective: Vital signs in last 24 hours: Vitals:   03/30/16 1800 03/30/16 1852 03/30/16 2139 03/31/16 0254  BP: 150/71 (!) 142/83 (!) 101/54 119/68  Pulse: 63 64 67 77  Resp: 19  18 18   Temp:   98.7 F (37.1 C) 98.2 F (36.8 C)  TempSrc:   Oral Oral  SpO2: 98% 96% 97% 97%  Weight:      Height:       Physical Exam: Physical Exam  Constitutional: No distress.  Cardiovascular: Normal rate, regular rhythm and normal heart sounds.   Pulmonary/Chest: Effort normal and breath sounds normal.  Abdominal: Soft. Bowel sounds are normal. There is no tenderness.  Musculoskeletal: He exhibits edema (LLE) and tenderness (LLE).  Skin: Skin is warm and dry. He is not diaphoretic.   Labs: CBC:  Recent Labs Lab 03/30/16 1245 03/31/16 0239  WBC 8.5 8.4  HGB 15.0 14.3  HCT 46.6 44.7  MCV 91.6 91.8  PLT [id number]   Metabolic Panel:  Recent Labs Lab 03/30/16 1245 03/31/16 0239  NA 137 137  K 4.5 4.6  CL 100* 100*  CO2 28 29  GLUCOSE 92 108*  BUN 21* 21*  CREATININE 1.39* 1.40*  CALCIUM 10.3 10.2   Cardiac Labs:  Recent Labs Lab 03/30/16 1309  TROPIPOC 0.00    Assessment/Plan: Pt is a 70 y.o. yo male with a PMHx of HTN, HLD, BPH who was admitted on 03/30/2016 with symptoms of DVT and PE.  LLE DVTs and submassive PEs: HDS. No hypoxia. Minor SOB w/ exertion. Possibly provoked w/ h/o testosterone therapy and immobilization on his tractor - transition from therapeutic dose lovenox to Xarelto per pharmacy - plan 3-6 months therapy per outpt PCP  Length of Stay: 1 day(s) Dispo: Anticipated discharge today.  Holley Raring, MD Pager: [phone] (7AM-5PM) 03/31/2016, 3:16 PM

## 2016-03-31 NOTE — Progress Notes (Signed)
Ambulated Pt in hallway 200 feet on RA.  Pt beginning sat standing 97% on RA.  Pt lowest sat while ambulating 93%.  Pt tol well.  No dyspnea noted.

## 2016-03-31 NOTE — Progress Notes (Signed)
Order received to discharge.  Telemetry removed and CCMD notified.  IV removed with catheter intact.  Discharge education given with handout on Xarelto.  Pt indicates understanding.  Pt denies chest pain or sob at this time.  Pt stable to discharge.

## 2016-04-03 ENCOUNTER — Telehealth (HOSPITAL_COMMUNITY): Payer: Self-pay | Admitting: *Deleted

## 2016-04-03 NOTE — Telephone Encounter (Signed)
Patient given detailed instructions per Myocardial Perfusion Study Information Sheet for the test on 04/05/16 at 1000. Patient notified to arrive 15 minutes early and that it is imperative to arrive on time for appointment to keep from having the test rescheduled.  If you need to cancel or reschedule your appointment, please call the office within 24 hours of your appointment. Failure to do so may result in a cancellation of your appointment, and a $50 no show fee. Patient verbalized understanding.Hasspacher, Ranae Palms

## 2016-04-05 ENCOUNTER — Other Ambulatory Visit: Payer: Self-pay

## 2016-04-05 ENCOUNTER — Encounter (HOSPITAL_COMMUNITY): Payer: 59

## 2016-04-05 ENCOUNTER — Ambulatory Visit (HOSPITAL_BASED_OUTPATIENT_CLINIC_OR_DEPARTMENT_OTHER): Payer: Medicare Other

## 2016-04-05 ENCOUNTER — Ambulatory Visit (HOSPITAL_COMMUNITY): Payer: Medicare Other | Attending: Cardiovascular Disease

## 2016-04-05 DIAGNOSIS — R0602 Shortness of breath: Secondary | ICD-10-CM | POA: Diagnosis not present

## 2016-04-05 DIAGNOSIS — R0600 Dyspnea, unspecified: Secondary | ICD-10-CM

## 2016-04-05 DIAGNOSIS — I119 Hypertensive heart disease without heart failure: Secondary | ICD-10-CM | POA: Insufficient documentation

## 2016-04-05 DIAGNOSIS — I351 Nonrheumatic aortic (valve) insufficiency: Secondary | ICD-10-CM | POA: Diagnosis not present

## 2016-04-05 DIAGNOSIS — R0789 Other chest pain: Secondary | ICD-10-CM

## 2016-04-05 DIAGNOSIS — R079 Chest pain, unspecified: Secondary | ICD-10-CM | POA: Diagnosis present

## 2016-04-05 DIAGNOSIS — E785 Hyperlipidemia, unspecified: Secondary | ICD-10-CM | POA: Diagnosis not present

## 2016-04-05 DIAGNOSIS — Z8249 Family history of ischemic heart disease and other diseases of the circulatory system: Secondary | ICD-10-CM | POA: Insufficient documentation

## 2016-04-05 LAB — MYOCARDIAL PERFUSION IMAGING
Estimated workload: 12.5 METS
Exercise duration (min): 10 min
Exercise duration (sec): 30 s
LV dias vol: 142 mL (ref 62–150)
LV sys vol: 63 mL
MPHR: 157 {beats}/min
Peak HR: 137 {beats}/min
Percent HR: 91 %
RATE: 0.3
RPE: 91
Rest HR: 56 {beats}/min
SDS: 2
SRS: 3
SSS: 5
TID: 0.95

## 2016-04-05 MED ORDER — TECHNETIUM TC 99M TETROFOSMIN IV KIT
10.6000 | PACK | Freq: Once | INTRAVENOUS | Status: AC | PRN
  Administered 2016-04-05: 11 via INTRAVENOUS
  Filled 2016-04-05: qty 11

## 2016-04-05 MED ORDER — TECHNETIUM TC 99M TETROFOSMIN IV KIT
31.6000 | PACK | Freq: Once | INTRAVENOUS | Status: AC | PRN
  Administered 2016-04-05: 32 via INTRAVENOUS
  Filled 2016-04-05: qty 32

## 2016-04-11 NOTE — Telephone Encounter (Signed)
New Message  Pt is returning Pam's call.   Please follow up with pt. Thanks!

## 2016-04-11 NOTE — Telephone Encounter (Signed)
Called patient back with stress test and echo results.  Per [name], EF normal mild to moderate AR f/u echo in a year.  Put in order for echo and recall letter for a year.

## 2016-04-12 ENCOUNTER — Encounter (HOSPITAL_COMMUNITY): Payer: Self-pay

## 2016-04-12 ENCOUNTER — Emergency Department (HOSPITAL_COMMUNITY): Payer: Medicare Other

## 2016-04-12 ENCOUNTER — Telehealth: Payer: Self-pay | Admitting: Cardiovascular Disease

## 2016-04-12 ENCOUNTER — Emergency Department (HOSPITAL_COMMUNITY)
Admission: EM | Admit: 2016-04-12 | Discharge: 2016-04-12 | Disposition: A | Discharge status: Home / Self Care | Payer: Medicare Other | Attending: Emergency Medicine | Admitting: Emergency Medicine

## 2016-04-12 DIAGNOSIS — Z79899 Other long term (current) drug therapy: Secondary | ICD-10-CM | POA: Diagnosis not present

## 2016-04-12 DIAGNOSIS — R52 Pain, unspecified: Secondary | ICD-10-CM

## 2016-04-12 DIAGNOSIS — I10 Essential (primary) hypertension: Secondary | ICD-10-CM | POA: Diagnosis not present

## 2016-04-12 DIAGNOSIS — I82402 Acute embolism and thrombosis of unspecified deep veins of left lower extremity: Secondary | ICD-10-CM | POA: Diagnosis not present

## 2016-04-12 DIAGNOSIS — M79605 Pain in left leg: Secondary | ICD-10-CM | POA: Diagnosis present

## 2016-04-12 DIAGNOSIS — I824Y2 Acute embolism and thrombosis of unspecified deep veins of left proximal lower extremity: Secondary | ICD-10-CM

## 2016-04-12 HISTORY — DX: Acute embolism and thrombosis of unspecified deep veins of unspecified lower extremity: I82.409

## 2016-04-12 HISTORY — DX: Other pulmonary embolism without acute cor pulmonale: I26.99

## 2016-04-12 LAB — CBC WITH DIFFERENTIAL/PLATELET
Basophils Absolute: 0.1 10*3/uL (ref 0.0–0.1)
Basophils Relative: 1 %
Eosinophils Absolute: 0.1 10*3/uL (ref 0.0–0.7)
Eosinophils Relative: 2 %
HCT: 47.4 % (ref 39.0–52.0)
Hemoglobin: 15.3 g/dL (ref 13.0–17.0)
Lymphocytes Relative: 16 %
Lymphs Abs: 1.4 10*3/uL (ref 0.7–4.0)
MCH: 29.8 pg (ref 26.0–34.0)
MCHC: 32.3 g/dL (ref 30.0–36.0)
MCV: 92.2 fL (ref 78.0–100.0)
Monocytes Absolute: 0.6 10*3/uL (ref 0.1–1.0)
Monocytes Relative: 7 %
Neutro Abs: 6.2 10*3/uL (ref 1.7–7.7)
Neutrophils Relative %: 74 %
Platelets: 269 10*3/uL (ref 150–400)
RBC: 5.14 MIL/uL (ref 4.22–5.81)
RDW: 13.2 % (ref 11.5–15.5)
WBC: 8.4 10*3/uL (ref 4.0–10.5)

## 2016-04-12 LAB — BASIC METABOLIC PANEL
Anion gap: 4 — ABNORMAL LOW (ref 5–15)
BUN: 20 mg/dL (ref 6–20)
CO2: 32 mmol/L (ref 22–32)
Calcium: 10 mg/dL (ref 8.9–10.3)
Chloride: 101 mmol/L (ref 101–111)
Creatinine, Ser: 1.47 mg/dL — ABNORMAL HIGH (ref 0.61–1.24)
GFR calc Af Amer: 54 mL/min — ABNORMAL LOW (ref >60)
GFR calc non Af Amer: 47 mL/min — ABNORMAL LOW (ref >60)
Glucose, Bld: 93 mg/dL (ref 65–99)
Potassium: 4.5 mmol/L (ref 3.5–5.1)
Sodium: 137 mmol/L (ref 135–145)

## 2016-04-12 NOTE — Telephone Encounter (Signed)
Called patient's wife [name] [facility]) back. Patient having similar symptoms to when he had his recent DVT's,  with cramping, pain, and swelling in his LLE. Advisement patient to go to ED.

## 2016-04-12 NOTE — ED Provider Notes (Signed)
[facility] DEPT Provider Note   CSN: [id number] Arrival date & time: 04/12/16  1235     History   Chief Complaint Chief Complaint  Patient presents with  . Leg Pain    HPI Adrian Franco is a 70 y.o. male.  He is here for cramping pain in his left leg, which started today. The pain is felt in his calf, it is intermittent and severe, then resolves, spontaneously. At the time of evaluation in the emergency department. He is not having any pain at all. He denies shortness of breath, fever, chills, nausea or vomiting. He is taking his usual medications including Xarelto, twice a day at this time. He was discharged from the hospital 03/31/2016, after diagnosis of left leg DVT and bilateral pulmonary emboli. He continues to have intermittent left leg swelling, and showed me a picture of the left leg, which was swollen yesterday. The swelling of the left leg does improve at rest. There are no other known modifying factors.        Marland KitchenHPI  Past Medical History:  Diagnosis Date  . Atypical chest pain   . BPH (benign prostatic hypertrophy)   . DVT (deep venous thrombosis) ([place])   . GERD (gastroesophageal reflux disease)   . HTN (hypertension)   . Hyperlipemia   . Hypogonadism in male   . Insomnia   . Localized edema   . PE (pulmonary embolism)   . Renal insufficiency   . SOB (shortness of breath)   . Testicular hypofunction     Patient Active Problem List   Diagnosis Date Noted  . Pulmonary emboli ([place]) 03/30/2016  . DVT (deep venous thrombosis) ([place]) 03/30/2016  . Swelling of ankle 03/15/2016  . HTN (hypertension)   . Atypical chest pain   . SOB (shortness of breath)     Past Surgical History:  Procedure Laterality Date  . INGUINAL HERNIA REPAIR         Home Medications    Prior to Admission medications   Medication Sig Start Date End Date Taking? Authorizing Provider  acetaminophen (TYLENOL) 500 MG tablet Take 500-1,000 mg by mouth every 6 (six) hours as  needed for mild pain or moderate pain.   Yes Historical Provider, MD  aluminum-magnesium hydroxide-simethicone (MAALOX) [id number] MG/5ML SUSP Take 15-30 mLs by mouth at bedtime as needed (for indigestion/heartburn).    Yes Historical Provider, MD  CALCIUM PO Take 1 tablet by mouth daily.   Yes Historical Provider, MD  docusate sodium (COLACE) 100 MG capsule Take 100 mg by mouth daily as needed for mild constipation.   Yes Historical Provider, MD  fenofibrate 160 MG tablet Take 160 mg by mouth daily.   Yes Historical Provider, MD  losartan (COZAAR) 100 MG tablet Take 100 mg by mouth daily.   Yes Historical Provider, MD  magnesium oxide (MAG-OX) 400 MG tablet Take 400 mg by mouth every morning.   Yes Historical Provider, MD  Multiple Vitamins-Minerals (ONE-A-DAY MENS 50+ ADVANTAGE) TABS Take 1 tablet by mouth daily with breakfast.   Yes Historical Provider, MD  nitroGLYCERIN (NITROSTAT) 0.4 MG SL tablet Place 0.4 mg under the tongue every 5 (five) minutes as needed. For chest pain 03/13/16  Yes Historical Provider, MD  omeprazole (PRILOSEC) 40 MG capsule Take 40 mg by mouth daily.   Yes Historical Provider, MD  Rivaroxaban (XARELTO) 15 MG TABS tablet Take 1 tablet (15 mg total) by mouth 2 (two) times daily with a meal. 03/31/16  Yes Holley Raring, MD  ropinirole (REQUIP) 5 MG tablet Take 5 mg by mouth at bedtime.   Yes Historical Provider, MD  simvastatin (ZOCOR) 20 MG tablet Take 20 mg by mouth daily.   Yes Historical Provider, MD  tamsulosin (FLOMAX) 0.4 MG CAPS capsule Take 0.4 mg by mouth 2 (two) times daily.    Yes Historical Provider, MD  traMADol (ULTRAM) 50 MG tablet Take 50 mg by mouth every 8 (eight) hours as needed.   Yes Historical Provider, MD  zolpidem (AMBIEN) 10 MG tablet Take 10 mg by mouth at bedtime as needed for sleep.   Yes Historical Provider, MD  rivaroxaban (XARELTO) 20 MG TABS tablet Take 1 tablet (20 mg total) by mouth daily with supper. 04/21/16   Holley Raring, MD    Family  History Family History  Problem Relation Age of Onset  . Hyperlipidemia Mother   . Hyperlipidemia Father   . Healthy Son   . Healthy Brother   . Other Brother   . Healthy Sister   . Healthy Son     Social History Social History  Substance Use Topics  . Smoking status: Never Smoker  . Smokeless tobacco: Never Used  . Alcohol use No     Allergies   Sulfa antibiotics; Amoxapine and related; and Lotensin [benazepril hcl]   Review of Systems Review of Systems  All other systems reviewed and are negative.    Physical Exam Updated Vital Signs BP 116/84   Pulse 67   Temp 98.1 F (36.7 C) (Oral)   Resp 19   Ht 5\' 8"  (1.727 m)   Wt 185 lb (83.9 kg)   SpO2 99%   BMI 28.13 kg/m   Physical Exam  Constitutional: He is oriented to person, place, and time. He appears well-developed and well-nourished.  HENT:  Head: Normocephalic and atraumatic.  Right Ear: External ear normal.  Left Ear: External ear normal.  Eyes: Conjunctivae and EOM are normal. Pupils are equal, round, and reactive to light.  Neck: Normal range of motion and phonation normal. Neck supple.  Cardiovascular: Normal rate, regular rhythm and normal heart sounds.   Pulmonary/Chest: Effort normal and breath sounds normal. He exhibits no bony tenderness.  Abdominal: Soft. There is no tenderness.  Musculoskeletal: Normal range of motion.  Left leg is mildly swollen calf and anterior shin. Negative left Homans. No popliteal mass. No popliteal tenderness. No erythema, or skin defect of the left leg.  Neurological: He is alert and oriented to person, place, and time. No cranial nerve deficit or sensory deficit. He exhibits normal muscle tone. Coordination normal.  Skin: Skin is warm, dry and intact.  Psychiatric: He has a normal mood and affect. His behavior is normal. Judgment and thought content normal.  Nursing note and vitals reviewed.    ED Treatments / Results  Labs (all labs ordered are listed, but  only abnormal results are displayed) Labs Reviewed  BASIC METABOLIC PANEL - Abnormal; Notable for the following:       Result Value   Creatinine, Ser 1.47 (*)    GFR calc non Af Amer 47 (*)    GFR calc Af Amer 54 (*)    Anion gap 4 (*)    All other components within normal limits  CBC WITH DIFFERENTIAL/PLATELET    EKG  EKG Interpretation None       Radiology US Venous Img Lower Unilateral Left  Result Date: 04/12/2016 CLINICAL DATA:  Left lower extremity pain and edema EXAM: LEFT LOWER EXTREMITY VENOUS DUPLEX  ULTRASOUND TECHNIQUE: Gray-scale sonography with graded compression, as well as color Doppler and duplex ultrasound were performed to evaluate the left lower extremity deep venous system from the level of the common femoral vein and including the common femoral, femoral, profunda femoral, popliteal and calf veins including the posterior tibial, peroneal and gastrocnemius veins when visible. The superficial great saphenous vein was also interrogated. Spectral Doppler was utilized to evaluate flow at rest and with distal augmentation maneuvers in the common femoral, femoral and popliteal veins. COMPARISON:  None. FINDINGS: Contralateral Common Femoral Vein: Respiratory phasicity is normal and symmetric with the symptomatic side. No evidence of thrombus. Normal compressibility. Common Femoral Vein: There is acute appearing thrombus in the left common femoral vein with somewhat diminished Doppler signal in this area. There is diminished compression and augmentation in this vessel. Saphenofemoral Junction: No evidence of thrombus. Normal compressibility and flow on color Doppler imaging. Profunda Femoral Vein: There is acute appearing thrombus with absence of Doppler signal in this vessel. No compression or augmentation. Femoral Vein: There is acute appearing thrombus throughout this vessel with loss of Doppler signal in all but the most proximal aspect of this vessel. There is loss of  compression augmentation in this vessel. Popliteal Vein: There is acute appearing thrombus with loss of compression and augmentation. There is loss of venous Doppler signal. Calf Veins: There is acute appearing thrombus with loss of compression and augmentation. There is loss of the this Doppler signal. Superficial Great Saphenous Vein: No evidence of thrombus. Normal compressibility and flow on color Doppler imaging. Venous Reflux:  None. Other Findings:  None. IMPRESSION: Extensive left lower extremity deep venous thrombosis. Right common femoral vein patent. These results were called by telephone at the time of interpretation on 04/12/2016 at 2:28 pm to Dr. Daleen Bo , who verbally acknowledged these results. Electronically Signed   By: Lowella Grip III M.D.   On: 04/12/2016 14:29    Procedures Procedures (including critical care time)  Medications Ordered in ED Medications - No data to display   Initial Impression / Assessment and Plan / ED Course  I have reviewed the triage vital signs and the nursing notes.  Pertinent labs & imaging results that were available during my care of the patient were reviewed by me and considered in my medical decision making (see chart for details).  Clinical Course  Comment By Time  I discussed the case at length with Dr. Donzetta Matters, vascular surgeon. He states that the patient can be considered to be a treatment failure, if his symptoms are significantly worsened. There is no way to objectively compare the prior imaging, by vascular surgery, and imaging, today in radiology, that were diagnostic for DVT. Daleen Bo, MD 08/17 1541    Medications - No data to display  Patient Vitals for the past 24 hrs:  BP Temp Temp src Pulse Resp SpO2 Height Weight  04/12/16 1530 116/84 - - 67 19 99 % - -  04/12/16 1500 116/85 - - 72 19 97 % - -  04/12/16 1430 131/81 - - 69 19 95 % - -  04/12/16 1400 125/85 - - 61 18 97 % - -  04/12/16 1330 121/79 - - (!) 59 16 99  % - -  04/12/16 1300 131/69 - - 65 16 98 % - -  04/12/16 1241 133/68 98.1 F (36.7 C) Oral 66 18 97 % 5\' 8"  (1.727 m) 185 lb (83.9 kg)   14:28- case discussed with radiologist, Dr. Jasmine December, who  is unable see prior images done by vascular, when he was diagnosed with a DVT, earlier this month. He states he is unable to comment on whether or not the DVT, is worse today.  3:23 PM Reevaluation with update and discussion. After initial assessment and treatment, an updated evaluation reveals Patient remains comfortable and states he is not currently having pain in his left leg. He recalls having pain in the left thigh, prior to onset of his pulmonary symptoms 2 weeks ago. I had an extensive discussion with the patient and his wife regarding the consideration for treatment failure with xarelto. We discussed possibly starting Lovenox, and, after informed discussion, the patient declined changing from xarelto, to Lovenox. We are able to schedule a follow-up appointment with the vascular surgeon, tomorrow for initiation of care and management. There will be no changes in his treatment course at this time.Daleen Bo L    Final Clinical Impressions(s) / ED Diagnoses   Final diagnoses:  Pain  Deep vein thrombosis (DVT) of proximal vein of left lower extremity, unspecified chronicity (HCC)    Left leg DVT, with pulmonary emboli. Patient's vital signs are entirely normal. Left leg swelling is improved with rest and elevation. In emergency department, he is symptom-free. He has chosen to not initiate treatment for possible xarelto failure, and plans of following up with vascular surgery tomorrow as scheduled.  Nursing Notes Reviewed/ Care Coordinated Applicable Imaging Reviewed Interpretation of Laboratory Data incorporated into ED treatment  The patient appears reasonably screened and/or stabilized for discharge and I doubt any other medical condition or other [facility] requiring further screening,  evaluation, or treatment in the ED at this time prior to discharge.  Plan: Home Medications- continue; Home Treatments- rest, elevate legs; return here if the recommended treatment, does not improve the symptoms; Recommended follow up- Dr. Donzetta Matters (Vascular) at 10 AM tomorrow.   New Prescriptions Discharge Medication List as of 04/12/2016  3:25 PM       Daleen Bo, MD 04/12/16 1556

## 2016-04-12 NOTE — ED Notes (Signed)
Pt reports having blood clots in bilateral lower lobes of lungs and LLE. States he started having cramping in LLE this morning that woke him from his sleep. Pt denies SOB/CP.

## 2016-04-12 NOTE — Telephone Encounter (Signed)
Mrs. Hochman is calling because Adrian Franco was in the hospital for blood clots in leg that went to his lungs , and has not had any cramping since he was discharged from the hospital and has been on Xarelto . This morning he had 5 really bad cramps from 3:45am until 6:15pm  And the (L) is swollen really bad . Please call

## 2016-04-12 NOTE — Telephone Encounter (Signed)
F/u     Pts wife would like the nurse to call her back.

## 2016-04-12 NOTE — ED Triage Notes (Signed)
Patient reports of was admitted at [facility] for blood clot in left leg and bilateral lungs. Was d/c 03/31/16. Started having cramps in left leg last night. Denies shortness of breath.

## 2016-04-12 NOTE — Telephone Encounter (Signed)
Patient's wife was informing our office that patient would be going to [employer] [facility].

## 2016-04-13 ENCOUNTER — Ambulatory Visit (INDEPENDENT_AMBULATORY_CARE_PROVIDER_SITE_OTHER): Payer: Medicare Other | Admitting: Vascular Surgery

## 2016-04-13 ENCOUNTER — Ambulatory Visit (HOSPITAL_COMMUNITY)
Admission: RE | Admit: 2016-04-13 | Discharge: 2016-04-13 | Disposition: A | Discharge status: Home / Self Care | Payer: Medicare Other | Source: Ambulatory Visit | Attending: Vascular Surgery | Admitting: Vascular Surgery

## 2016-04-13 VITALS — BP 132/76 | HR 69 | Temp 97.9°F | Resp 16 | Ht 68.0 in | Wt 191.0 lb

## 2016-04-13 DIAGNOSIS — I824Z2 Acute embolism and thrombosis of unspecified deep veins of left distal lower extremity: Secondary | ICD-10-CM | POA: Insufficient documentation

## 2016-04-13 DIAGNOSIS — G4700 Insomnia, unspecified: Secondary | ICD-10-CM | POA: Diagnosis not present

## 2016-04-13 DIAGNOSIS — I10 Essential (primary) hypertension: Secondary | ICD-10-CM | POA: Diagnosis not present

## 2016-04-13 DIAGNOSIS — E785 Hyperlipidemia, unspecified: Secondary | ICD-10-CM | POA: Diagnosis not present

## 2016-04-13 DIAGNOSIS — K219 Gastro-esophageal reflux disease without esophagitis: Secondary | ICD-10-CM | POA: Insufficient documentation

## 2016-04-13 NOTE — Progress Notes (Signed)
Patient ID: Adrian Franco, male   DOB: [birth year], 70 y.o.   MRN: [id number]  Reason for Consult: DVT (seen at [facility] yesterday with DVT, Was hospitalized 03-30-16 w/ DVT and bilateral PE. He is on Xarelton)   Referred by Adrian Alken, FNP  Subjective:     HPI:  Adrian Franco is a 70 y.o. male otherwise healthy until episode of shortness of breath earlier this month and was found to submassive pe and dvt in most of LLE. Was started on xarelto at discharge and had been doing well until yesterday when he presented to [facility] ed with worsening L calf pain that has been present since initial presentation. Pain is cramping in nature and wakes him up at night. Denies history of blood clot, no family history, no recent sedentary events and is up to date on colonoscopy without symptoms to suggest occult malignancy.  Past Medical History:  Diagnosis Date  . Atypical chest pain   . BPH (benign prostatic hypertrophy)   . DVT (deep venous thrombosis) ([place])   . GERD (gastroesophageal reflux disease)   . HTN (hypertension)   . Hyperlipemia   . Hypogonadism in male   . Insomnia   . Localized edema   . PE (pulmonary embolism)   . Renal insufficiency   . SOB (shortness of breath)   . Testicular hypofunction    Family History  Problem Relation Age of Onset  . Hyperlipidemia Mother   . Hyperlipidemia Father   . Healthy Son   . Healthy Brother   . Other Brother   . Healthy Sister   . Healthy Son    Past Surgical History:  Procedure Laterality Date  . INGUINAL HERNIA REPAIR      Short Social History:  Social History  Substance Use Topics  . Smoking status: Never Smoker  . Smokeless tobacco: Never Used  . Alcohol use No    Allergies  Allergen Reactions  . Sulfa Antibiotics Itching  . Amoxapine And Related Hives and Rash  . Lotensin [Benazepril Hcl] Hives and Rash    Current Outpatient Prescriptions  Medication Sig Dispense Refill  . acetaminophen (TYLENOL) 500  MG tablet Take 500-1,000 mg by mouth every 6 (six) hours as needed for mild pain or moderate pain.    Marland Kitchen aluminum-magnesium hydroxide-simethicone (MAALOX) [id number] MG/5ML SUSP Take 15-30 mLs by mouth at bedtime as needed (for indigestion/heartburn).     . CALCIUM PO Take 1 tablet by mouth daily.    Marland Kitchen docusate sodium (COLACE) 100 MG capsule Take 100 mg by mouth daily as needed for mild constipation.    . fenofibrate 160 MG tablet Take 160 mg by mouth daily.    Marland Kitchen losartan (COZAAR) 100 MG tablet Take 100 mg by mouth daily.    . magnesium oxide (MAG-OX) 400 MG tablet Take 400 mg by mouth every morning.    . Multiple Vitamins-Minerals (ONE-A-DAY MENS 50+ ADVANTAGE) TABS Take 1 tablet by mouth daily with breakfast.    . nitroGLYCERIN (NITROSTAT) 0.4 MG SL tablet Place 0.4 mg under the tongue every 5 (five) minutes as needed. For chest pain    . omeprazole (PRILOSEC) 40 MG capsule Take 40 mg by mouth daily.    . Rivaroxaban (XARELTO) 15 MG TABS tablet Take 1 tablet (15 mg total) by mouth 2 (two) times daily with a meal. 42 tablet 0  . [START ON 04/21/2016] rivaroxaban (XARELTO) 20 MG TABS tablet Take 1 tablet (20 mg total) by mouth  daily with supper. 30 tablet 2  . ropinirole (REQUIP) 5 MG tablet Take 5 mg by mouth at bedtime.    . simvastatin (ZOCOR) 20 MG tablet Take 20 mg by mouth daily.    . tamsulosin (FLOMAX) 0.4 MG CAPS capsule Take 0.4 mg by mouth 2 (two) times daily.     . traMADol (ULTRAM) 50 MG tablet Take 50 mg by mouth every 8 (eight) hours as needed.    . zolpidem (AMBIEN) 10 MG tablet Take 10 mg by mouth at bedtime as needed for sleep.     No current facility-administered medications for this visit.     Review of Systems  Constitutional:  Constitutional negative. Eyes: Eyes negative.  Respiratory: Respiratory negative.  Cardiovascular: Positive for chest pain. Negative for dyspnea with exertion, irregular heartbeat, palpitations and syncope.  GI: Negative for abdominal pain, blood  in stool, diarrhea and vomiting.  Musculoskeletal: Positive for leg pain.  Skin: Negative for rash.  Neurological: Neurological negative. Hematologic: Hematologic/lymphatic negative.        Objective:  Objective   Vitals:   04/13/16 0955  BP: 132/76  Pulse: 69  Resp: 16  Temp: 97.9 F (36.6 C)  TempSrc: Oral  SpO2: 97%  Weight: 191 lb (86.6 kg)  Height: 5\' 8"  (1.727 m)   Body mass index is 29.04 kg/m.  Physical Exam  Constitutional: He is oriented to person, place, and time. He appears well-developed.  HENT:  Head: Normocephalic.  Eyes: Pupils are equal, round, and reactive to light.  Neck: Normal range of motion.  Cardiovascular: Normal rate and regular rhythm.   Pulmonary/Chest: Effort normal.  Abdominal: Soft. He exhibits no mass.  Musculoskeletal:  L lower leg non pitting edema, not tight Palpable femoral to pedal pulses bilaterally Purplish discoloration of left toes  Neurological: He is alert and oriented to person, place, and time. He has normal reflexes.    Data: IMPRESSION: Extensive left lower extremity deep venous thrombosis. Right common femoral vein patent.  Duplex ivc and left iliac system without thrombus or obstruction in office today     Assessment/Plan:  70yo WM with recent history of seemingly unprovoked dvt LLE and submassive pe. Now has persistent L leg pain but without proximal obstruction or clot. I spent significant time couselling he and his wife that the best treatment will be anticoagulation and ambulation and have recommended light over the counter compression. Will need at 3 months anticoagulation but should be considered for longer given unprovoked nature of dvt. F/u in 3 months with repeat LLE venous duplex.     Waynetta Sandy MD [facility]

## 2016-04-18 ENCOUNTER — Other Ambulatory Visit: Payer: Self-pay | Admitting: Internal Medicine

## 2016-04-19 ENCOUNTER — Encounter: Payer: Self-pay | Admitting: Physician Assistant

## 2016-04-19 NOTE — Telephone Encounter (Addendum)
Received xarelto refill request from pt's pharmacy.  Patient no longer under [facility] care and per d/c summary he was to f/u with his pcp in [place] about his xarelto.  I did speak with patient and he informed me that he has not run out of xarelto.  I will reach out to pt's pcp office tomorrow to make them aware of refill request and need for pt to schedule HFU as office has already closed for the day.Despina Hidden Cassady8/24/20174:58 PM      PCP : Ephriam Jenkins Ph# [phone]

## 2016-04-20 ENCOUNTER — Ambulatory Visit (INDEPENDENT_AMBULATORY_CARE_PROVIDER_SITE_OTHER): Payer: Medicare Other | Admitting: Physician Assistant

## 2016-04-20 ENCOUNTER — Encounter: Payer: Self-pay | Admitting: Physician Assistant

## 2016-04-20 VITALS — BP 130/70 | HR 94 | Ht 68.0 in | Wt 193.0 lb

## 2016-04-20 DIAGNOSIS — I2699 Other pulmonary embolism without acute cor pulmonale: Secondary | ICD-10-CM | POA: Diagnosis not present

## 2016-04-20 DIAGNOSIS — I824Z2 Acute embolism and thrombosis of unspecified deep veins of left distal lower extremity: Secondary | ICD-10-CM

## 2016-04-20 DIAGNOSIS — N189 Chronic kidney disease, unspecified: Secondary | ICD-10-CM

## 2016-04-20 DIAGNOSIS — R7989 Other specified abnormal findings of blood chemistry: Secondary | ICD-10-CM

## 2016-04-20 DIAGNOSIS — R252 Cramp and spasm: Secondary | ICD-10-CM

## 2016-04-20 DIAGNOSIS — E291 Testicular hypofunction: Secondary | ICD-10-CM

## 2016-04-20 DIAGNOSIS — I351 Nonrheumatic aortic (valve) insufficiency: Secondary | ICD-10-CM

## 2016-04-20 LAB — MAGNESIUM: Magnesium: 2.1 mg/dL (ref 1.5–2.5)

## 2016-04-20 NOTE — Progress Notes (Signed)
Cardiology Office Note    Date:  04/20/2016   ID:  AXYL Franco, DOB 01/23/1946, MRN [id number]  PCP:  Adrian Franco  Cardiologist: [name]   CC:  Follow up  History of Present Illness:  Adrian Franco is a 70 y.o. 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 as a new patient on 03/20/16 for atypical chest pain, fatigue and dyspnea. He also complained of pain and swelling in the LLE. [name] ordered an exercise myoview which was negative for ischemia. He also ordered a 2D ECHO which showed normal LV function, no WMAs, normal diastolic function, mod AR and mild LAE. LE venous dopplers were ordered which showed an acute LLE DVT. He was told to go to the ER immediately for CT.  He was admitted from 8/4-03/31/16 for a LLE DVT and submassive PE. CTA showed small subsegmental pulmonary embolisms with no signs of heart strain on hemodynamics, ECG, or cardiac biomarkers.  His Depo-Testosterone was discontinued was this was felt to possibly be the provoking cause and he was started on Xarelto. Aspirin was also stopped for primary prevention.   He was seen in the [facility] ER on 04/12/16 for severe leg pain and swelling, which resolved by the time of evaluation in ER. LE duplex showed persistent DVT. He was sent to Dr. Donzetta Matters with [facility] who saw him in the office on 04/13/16. He recommended continuing Xarelto, ambulation and over the counter compression. He felt the DVT was unprovoked and said that he should be on at least 3 months of [place] if not longer. He will see him in follow up in 3 months for another LLE venous duplex.   He has some LE cramping. He has some mild chest pain similar to previous. He still has some SOB with exertion. He has continued LLE edema and wearing a compression stocking. No orthopnea or PND. No dizziness or  syncope. No palpitations. No exertional leg pain. Overall doing well but has some fatigue.    Past Medical History:  Diagnosis Date  . Atypical chest pain   . BPH (benign prostatic hypertrophy)   . DVT (deep venous thrombosis) ([place])   . GERD (gastroesophageal reflux disease)   . HTN (hypertension)   . Hyperlipemia   . Hypogonadism in male   . Insomnia   . Localized edema   . PE (pulmonary embolism)   . Renal insufficiency   . SOB (shortness of breath)   . Testicular hypofunction     Past Surgical History:  Procedure Laterality Date  . INGUINAL HERNIA REPAIR      Current Medications: Outpatient Medications Prior to Visit  Medication Sig Dispense Refill  . acetaminophen (TYLENOL) 500 MG tablet Take 500-1,000 mg by mouth every 6 (six) hours as needed for mild pain or moderate pain.    Marland Kitchen aluminum-magnesium hydroxide-simethicone (MAALOX) [id number] MG/5ML SUSP Take 15-30 mLs by mouth at bedtime as needed (for indigestion/heartburn).     . CALCIUM PO Take 1 tablet by mouth daily.    Marland Kitchen docusate sodium (COLACE) 100 MG capsule Take 100 mg by mouth daily as needed for mild constipation.    . fenofibrate 160 MG tablet Take 160 mg by mouth daily.    Marland Kitchen losartan (COZAAR) 100 MG tablet Take 100 mg by mouth daily.    Marland Kitchen  magnesium oxide (MAG-OX) 400 MG tablet Take 400 mg by mouth every morning.    . Multiple Vitamins-Minerals (ONE-A-DAY MENS 50+ ADVANTAGE) TABS Take 1 tablet by mouth daily with breakfast.    . nitroGLYCERIN (NITROSTAT) 0.4 MG SL tablet Place 0.4 mg under the tongue every 5 (five) minutes as needed. For chest pain    . omeprazole (PRILOSEC) 40 MG capsule Take 40 mg by mouth daily.    Derrill Memo ON 04/21/2016] rivaroxaban (XARELTO) 20 MG TABS tablet Take 1 tablet (20 mg total) by mouth daily with supper. 30 tablet 2  . ropinirole (REQUIP) 5 MG tablet Take 5 mg by mouth at bedtime.    . simvastatin (ZOCOR) 20 MG tablet Take 20 mg by mouth daily.    . tamsulosin (FLOMAX) 0.4 MG CAPS  capsule Take 0.4 mg by mouth 2 (two) times daily.     . traMADol (ULTRAM) 50 MG tablet Take 50 mg by mouth every 8 (eight) hours as needed for moderate pain.     Marland Kitchen zolpidem (AMBIEN) 10 MG tablet Take 10 mg by mouth at bedtime as needed for sleep.     No facility-administered medications prior to visit.      Allergies:   Sulfa antibiotics; Amoxapine and related; and Lotensin [benazepril hcl]   Social History   Social History  . Marital status: Married    Spouse name: N/A  . Number of children: N/A  . Years of education: N/A   Social History Main Topics  . Smoking status: Never Smoker  . Smokeless tobacco: Never Used  . Alcohol use No  . Drug use: No  . Sexual activity: Yes     Comment: married   Other Topics Concern  . None   Social History Narrative  . None     Family History:  The patient'sfamily history includes Healthy in his brother, sister, son, and son; Heart attack in his father; Hyperlipidemia in his father and mother; Other in his brother.     ROS:   Please see the history of present illness.    ROS All other systems reviewed and are negative.   PHYSICAL EXAM:   VS:  BP 130/70   Pulse 94   Ht 5\' 8"  (1.727 m)   Wt 193 lb (87.5 kg)   BMI 29.35 kg/m    GEN: Well nourished, well developed, in no acute distress  HEENT: normal  Neck: no JVD, carotid bruits, or masses Cardiac: RRR; no murmurs, rubs, or gallops, LLE edema  Respiratory:  clear to auscultation bilaterally, normal work of breathing GI: soft, nontender, nondistended, + BS MS: no deformity or atrophy  Skin: warm and dry, no rash Neuro:  Alert and Oriented x 3, Strength and sensation are intact Psych: euthymic mood, full affect  Wt Readings from Last 3 Encounters:  04/20/16 193 lb (87.5 kg)  04/13/16 191 lb (86.6 kg)  04/12/16 185 lb (83.9 kg)      Studies/Labs Reviewed:   EKG:  EKG is NOT ordered today.    Recent Labs: 04/12/2016: BUN 20; Creatinine, Ser 1.47; Hemoglobin 15.3; Platelets  269; Potassium 4.5; Sodium 137   Lipid Panel No results found for: CHOL, TRIG, HDL, CHOLHDL, VLDL, LDLCALC, LDLDIRECT  Additional studies/ records that were reviewed today include:  04/05/16 Study Highlights    Nuclear stress EF: 55%.  Blood pressure demonstrated a hypertensive response to exercise.  There was no ST segment deviation noted during stress.  No T wave inversion was noted during stress.  The study is normal.  This is a low risk study.  The left ventricular ejection fraction is normal (55-65%).     2D ECHO: 04/05/2016 LV EF: 55% -   60% Study Conclusions - Left ventricle: The cavity size was normal. Wall thickness was   normal. Systolic function was normal. The estimated ejection   fraction was in the range of 55% to 60%. Wall motion was normal;   there were no regional wall motion abnormalities. Left   ventricular diastolic function parameters were normal. - Aortic valve: There was mild to moderate regurgitation directed   eccentrically in the LVOT and towards the mitral anterior   leaflet. - Left atrium: The atrium was mildly dilated.   ASSESSMENT & PLAN:  NICHOALS COLASUONNO is a 70 y.o. 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  DVT/PE: wearing compression stocking on left leg but it is too long. I have provided him a Rx for stockings. He will see Dr. Donzetta Matters with [facility] back in 3 months for repeat LLE venous duplex. He will need to be on Xarelto for at least 3-6 months. 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 Donzetta Matters called it unprovoked.   HTN: BP well controlled on current therapy  HLD: continue Zocor  Mild-mod AR: he is worried about this because his father had his aortic valve replaced. I reviewed his echo, he has a trileaflet valve. I reassured him  that many people have leaky valves and we would just follow it over time. Follow up echo in 1 year.   Chest pain: still has some atypical sharp stabbing pain. Recent reassuring exercise myoview. No further work up at this time.   CKD: creat 1.47. He had stopped taking Meloxicam  LE cramping: wife said she has low mag and wants to know if we can check this. K has been good on most recent labs  Low T: he and wife are worried that he will feel badly off his supplementation. I am not sure if this was the inciting cause of his DVT/PE, but if he does end up feeling badly off the testosterone therapy and his PCP thinks he needs it long term, he may need to continue his oral anticoagulation.    Medication Adjustments/Labs and Tests Ordered: Current medicines are reviewed at length with the patient today.  Concerns regarding medicines are outlined above.  Medication changes, Labs and Tests ordered today are listed in the Patient Instructions below. Patient Instructions  Your physician recommends that you continue on your current medications as directed. Please refer to the Current Medication list given to you today.   Your physician recommends that you return for lab work in:  [place] wants you to follow-up in:   [place] will receive a reminder letter in the mail two months in advance. If you don't receive a letter, please call our office to schedule the follow-up appointment.     Signed, Angelena Form, PA-C  04/20/2016 4:13 PM    [facility] [place], [place],   [address] Phone: [phone]; Fax: [phone]

## 2016-04-20 NOTE — Telephone Encounter (Signed)
He actually should not be out of xarelto. There were two prescriptions for xarelto during the hospitalization. The first was for 15mg  bid for three weeks. That one should be finished. The second was for xarelto 20mg  once daily to start now for at least three months. That was sent to the [facility] in [place] on 03/31/16 and has 2 refills. Please ask him to go to [facility] and pick this medicine up. If they do not have it, I will reorder. It is important that he does not miss a dose if we can help it.   Thanks,  Lalla Brothers

## 2016-04-20 NOTE — Patient Instructions (Signed)
Your physician recommends that you continue on your current medications as directed. Please refer to the Current Medication list given to you today.   Your physician recommends that you return for lab work in:  [place] wants you to follow-up in:   [place] will receive a reminder letter in the mail two months in advance. If you don't receive a letter, please call our office to schedule the follow-up appointment.

## 2016-04-20 NOTE — Telephone Encounter (Signed)
I just called him now to clarify. He is actually doing fine. The xarelto 15mg  bid dose ran out and that triggered an automatic refill request. He does not need a refill on this. He has the 20mg  tab and is planning on starting that tomorrow according to the schedule. So no further action is needed here. Thanks.

## 2016-04-23 ENCOUNTER — Other Ambulatory Visit: Payer: Self-pay | Admitting: Vascular Surgery

## 2016-04-23 ENCOUNTER — Telehealth: Payer: Self-pay | Admitting: *Deleted

## 2016-04-23 DIAGNOSIS — I824Y9 Acute embolism and thrombosis of unspecified deep veins of unspecified proximal lower extremity: Secondary | ICD-10-CM

## 2016-04-23 NOTE — Telephone Encounter (Signed)
Spoke with pt and made him aware that his mg was normal.  Pt verbalized understanding.

## 2016-04-23 NOTE — Telephone Encounter (Signed)
-----   Message from Eileen Stanford, PA-C sent at 04/22/2016 11:00 AM EDT ----- Not sure why results notes didn't go to me but his magnesium is normal

## 2016-05-30 ENCOUNTER — Ambulatory Visit: Payer: 59 | Admitting: Cardiovascular Disease

## 2016-06-20 ENCOUNTER — Telehealth: Payer: Self-pay

## 2016-06-20 ENCOUNTER — Ambulatory Visit: Payer: 59 | Admitting: Cardiology

## 2016-06-20 NOTE — Telephone Encounter (Signed)
Phone call from pt's wife.  Reported the pt. Had an episode of pain above the left knee, approx. 5 days ago.  Reported he hasn't had a recurrence of that pain, but continues to have intermittent pain behind the left knee.  Stated the pt. Has also been coughing. Denied chest pain or shortness of breath. Reported that the left leg continues to swell, and pt. Continues to wear the compression stockings.  Reported that his PCP decreased the Xarelto dose, and voiced concern that the DVT is worse.  Asking to move the appt. to earlier date, to recheck the DVT.  Advised to call the PCP re: new symptoms of cough, associated with the episode of left upper leg pain.  Advised that a scheduler will call pt. With an earlier appt. Wife requested an appt. as late in day as possible.

## 2016-06-21 NOTE — Telephone Encounter (Signed)
Sched appt 06/22/16; lab at 2:00 and NP at 2:45. Spoke to pt's wife to inform them of appt.

## 2016-06-22 ENCOUNTER — Encounter: Payer: Self-pay | Admitting: Family

## 2016-06-22 ENCOUNTER — Other Ambulatory Visit (HOSPITAL_COMMUNITY): Payer: Self-pay | Admitting: Internal Medicine

## 2016-06-22 ENCOUNTER — Ambulatory Visit (HOSPITAL_COMMUNITY)
Admission: RE | Admit: 2016-06-22 | Discharge: 2016-06-22 | Disposition: A | Discharge status: Home / Self Care | Payer: Medicare Other | Source: Ambulatory Visit | Attending: Vascular Surgery | Admitting: Vascular Surgery

## 2016-06-22 ENCOUNTER — Ambulatory Visit (INDEPENDENT_AMBULATORY_CARE_PROVIDER_SITE_OTHER): Payer: Medicare Other | Admitting: Family

## 2016-06-22 VITALS — BP 140/82 | HR 58 | Temp 97.8°F | Resp 18 | Ht 68.0 in | Wt 192.0 lb

## 2016-06-22 DIAGNOSIS — R52 Pain, unspecified: Secondary | ICD-10-CM

## 2016-06-22 DIAGNOSIS — I824Y9 Acute embolism and thrombosis of unspecified deep veins of unspecified proximal lower extremity: Secondary | ICD-10-CM | POA: Diagnosis present

## 2016-06-22 DIAGNOSIS — I824Z2 Acute embolism and thrombosis of unspecified deep veins of left distal lower extremity: Secondary | ICD-10-CM | POA: Diagnosis not present

## 2016-06-22 DIAGNOSIS — I82432 Acute embolism and thrombosis of left popliteal vein: Secondary | ICD-10-CM | POA: Insufficient documentation

## 2016-06-22 NOTE — Patient Instructions (Signed)
Deep Vein Thrombosis °A deep vein thrombosis (DVT) is a blood clot (thrombus) that usually occurs in a deep, larger vein of the lower leg or the pelvis, or in an upper extremity such as the arm. These are dangerous and can lead to serious and even life-threatening complications if the clot travels to the lungs. °A DVT can damage the valves in your leg veins so that instead of flowing upward, the blood pools in the lower leg. This is called post-thrombotic syndrome, and it can result in pain, swelling, discoloration, and sores on the leg. °CAUSES °A DVT is caused by the formation of a blood clot in your leg, pelvis, or arm. Usually, several things contribute to the formation of blood clots. A clot may develop when: °· Your blood flow slows down. °· Your vein becomes damaged in some way. °· You have a condition that makes your blood clot more easily. °RISK FACTORS °A DVT is more likely to develop in: °· People who are older, especially over 60 years of age. °· People who are overweight (obese). °· People who sit or lie still for a long time, such as during long-distance travel (over 4 hours), bed rest, hospitalization, or during recovery from certain medical conditions like a stroke. °· People who do not engage in much physical activity (sedentary lifestyle). °· People who have chronic breathing disorders. °· People who have a personal or family history of blood clots or blood clotting disease. °· People who have peripheral vascular disease (PVD), diabetes, or some types of cancer. °· People who have heart disease, especially if the person had a recent heart attack or has congestive heart failure. °· People who have neurological diseases that affect the legs (leg paresis). °· People who have had a traumatic injury, such as breaking a hip or leg. °· People who have recently had major or lengthy surgery, especially on the hip, knee, or abdomen. °· People who have had a central line placed inside a large vein. °· People  who take medicines that contain the hormone estrogen. These include birth control pills and hormone replacement therapy. °· Pregnancy or during childbirth or the postpartum period. °· Long plane flights (over 8 hours). °SIGNS AND SYMPTOMS °Symptoms of a DVT can include:  °· Swelling of your leg or arm, especially if one side is much worse. °· Warmth and redness of your leg or arm, especially if one side is much worse. °· Pain in your arm or leg. If the clot is in your leg, symptoms may be more noticeable or worse when you stand or walk. °· A feeling of pins and needles, if the clot is in the arm. °The symptoms of a DVT that has traveled to the lungs (pulmonary embolism, PE) usually start suddenly and include: °· Shortness of breath while active or at rest. °· Coughing or coughing up blood or blood-tinged mucus. °· Chest pain that is often worse with deep breaths. °· Rapid or irregular heartbeat. °· Feeling light-headed or dizzy. °· Fainting. °· Feeling anxious. °· Sweating. °There may also be pain and swelling in a leg if that is where the blood clot started. °These symptoms may represent a serious problem that is an emergency. Do not wait to see if the symptoms will go away. Get medical help right away. Call your local emergency services (911 in the [place] Do not drive yourself to the hospital. °DIAGNOSIS °Your health care provider will take a medical history and perform a physical exam. You may also   have other tests, including: °· Blood tests to assess the clotting properties of your blood. °· Imaging tests, such as CT, ultrasound, MRI, X-ray, and other tests to see if you have clots anywhere in your body. °TREATMENT °After a DVT is identified, it can be treated. The type of treatment that you receive depends on many factors, such as the cause of your DVT, your risk for bleeding or developing more clots, and other medical conditions that you have. Sometimes, a combination of treatments is necessary. Treatment  options may be combined and include: °· Monitoring the blood clot with ultrasound. °· Taking medicines by mouth, such as newer blood thinners (anticoagulants), thrombolytics, or warfarin. °· Taking anticoagulant medicine by injection or through an IV tube. °· Wearing compression stockings or using different types of devices. °· Surgery (rare) to remove the blood clot or to place a filter in your abdomen to stop the blood clot from traveling to your lungs. °Treatments for a DVT are often divided into immediate treatment and long-term treatment (up to 3 months after DVT). You can work with your health care provider to choose the treatment program that is best for you. °HOME CARE INSTRUCTIONS °If you are taking a newer oral anticoagulant: °· Take the medicine every single day at the same time each day. °· Understand what foods and drugs interact with this medicine. °· Understand that there are no regular blood tests required when using this medicine. °· Understand the side effects of this medicine, including excessive bruising or bleeding. Ask your health care provider or pharmacist about other possible side effects. °If you are taking warfarin: °· Understand how to take warfarin and know which foods can affect how warfarin works in your body. °· Understand that it is dangerous to take too much or too little warfarin. Too much warfarin increases the risk of bleeding. Too little warfarin continues to allow the risk for blood clots. °· Follow your PT and INR blood testing schedule. The PT and INR results allow your health care provider to adjust your dose of warfarin. It is very important that you have your PT and INR tested as often as told by your health care provider. °· Avoid major changes in your diet, or tell your health care provider before you change your diet. Arrange a visit with a registered dietitian to answer your questions. Many foods, especially foods that are high in vitamin K, can interfere with warfarin  and affect the PT and INR results. Eat a consistent amount of foods that are high in vitamin K, such as: °¨ Spinach, kale, broccoli, cabbage, collard greens, turnip greens, Brussels sprouts, peas, cauliflower, seaweed, and parsley. °¨ Beef liver and pork liver. °¨ Green tea. °¨ Soybean oil. °· Tell your health care provider about any and all medicines, vitamins, and supplements that you take, including aspirin and other over-the-counter anti-inflammatory medicines. Be especially cautious with aspirin and anti-inflammatory medicines. Do not take those before you ask your health care provider if it is safe to do so. This is important because many medicines can interfere with warfarin and affect the PT and INR results. °· Do not start or stop taking any over-the-counter or prescription medicine unless your health care provider or pharmacist tells you to do so. °If you take warfarin, you will also need to do these things: °· Hold pressure over cuts for longer than usual. °· Tell your dentist and other health care providers that you are taking warfarin before you have any procedures in which   bleeding may occur. °· Avoid alcohol or drink very small amounts. Tell your health care provider if you change your alcohol intake. °· Do not use tobacco products, including cigarettes, chewing tobacco, and e-cigarettes. If you need help quitting, ask your health care provider. °· Avoid contact sports. °General Instructions °· Take over-the-counter and prescription medicines only as told by your health care provider. Anticoagulant medicines can have side effects, including easy bruising and difficulty stopping bleeding. If you are prescribed an anticoagulant, you will also need to do these things: °¨ Hold pressure over cuts for longer than usual. °¨ Tell your dentist and other health care providers that you are taking anticoagulants before you have any procedures in which bleeding may occur. °¨ Avoid contact sports. °· Wear a medical  alert bracelet or carry a medical alert card that says you have had a PE. °· Ask your health care provider how soon you can go back to your normal activities. Stay active to prevent new blood clots from forming. °· Make sure to exercise while traveling or when you have been sitting or standing for a long period of time. It is very important to exercise. Exercise your legs by walking or by tightening and relaxing your leg muscles often. Take frequent walks. °· Wear compression stockings as told by your health care provider to help prevent more blood clots from forming. °· Do not use tobacco products, including cigarettes, chewing tobacco, and e-cigarettes. If you need help quitting, ask your health care provider. °· Keep all follow-up appointments with your health care provider. This is important. °PREVENTION °Take these actions to decrease your risk of developing another DVT: °· Exercise regularly. For at least 30 minutes every day, engage in: °¨ Activity that involves moving your arms and legs. °¨ Activity that encourages good blood flow through your body by increasing your heart rate. °· Exercise your arms and legs every hour during long-distance travel (over 4 hours). Drink plenty of water and avoid drinking alcohol while traveling. °· Avoid sitting or lying in bed for long periods of time without moving your legs. °· Maintain a weight that is appropriate for your height. Ask your health care provider what weight is healthy for you. °· If you are a woman who is over 35 years of age, avoid unnecessary use of medicines that contain estrogen. These include birth control pills. °· Do not smoke, especially if you take estrogen medicines. If you need help quitting, ask your health care provider. °If you are hospitalized, prevention measures may include: °· Early walking after surgery, as soon as your health care provider says that it is safe. °· Receiving anticoagulants to prevent blood clots. If you cannot take  anticoagulants, other options may be available, such as wearing compression stockings or using different types of devices. °SEEK IMMEDIATE MEDICAL CARE IF: °· You have new or increased pain, swelling, or redness in an arm or leg. °· You have numbness or tingling in an arm or leg. °· You have shortness of breath while active or at rest. °· You have chest pain. °· You have a rapid or irregular heartbeat. °· You feel light-headed or dizzy. °· You cough up blood. °· You notice blood in your vomit, bowel movement, or urine. °These symptoms may represent a serious problem that is an emergency. Do not wait to see if the symptoms will go away. Get medical help right away. Call your local emergency services (911 in the [place] Do not drive yourself to the hospital. °  °  This information is not intended to replace advice given to you by your health care provider. Make sure you discuss any questions you have with your health care provider. °  °Document Released: 08/13/2005 Document Revised: 05/04/2015 Document Reviewed: 12/08/2014 °[employer] Interactive Patient Education ©2016 [employer] ° °

## 2016-06-22 NOTE — Progress Notes (Signed)
CC: hx of DVT, recurrent left leg pain and dyspnea   History of Present Illness  Adrian Franco is a 70 y.o. (1946/07/30) male otherwise healthy until episode of shortness of breath in early August 2017 and was found to have submassive pe and dvt in most of LLE. Was started on xarelto at discharge and had been doing well until 04/12/16 when he presented to  ed with worsening L calf pain that has been present since initial presentation. Pain was cramping in nature and wakes him up at night. Denies previous history of blood clot, no family history, no recent sedentary events and is up to date on colonoscopy without symptoms to suggest occult malignancy.   Pt last saw Dr. Donzetta Matters on 04/13/16. At that time pt had extensive left lower extremity deep venous thrombosis. Right common femoral vein patent. Duplex ivc and left iliac system without thrombus or obstruction in office on 04/13/16.  He returns today with c/o 1 week history of intermittent severe cough and left anterior thigh pain.  He denies worse dyspnea with walking or when supine; he is a little more dyspneic when bending over.  Pt states left posterior popliteal pain remains, feels like a tight band. Also a week ago he noted pruritic rash at his left lower leg that seems to be worsening.  He also feels "washed out". His testosterone injections were stopped by one of his medical providers, possibly trying to eliminate a potential facilitator of DVT.  Wife states his testosterone levels are very low recently.   He received flu and pneumonia vaccinations a week ago.    He is still taking the Xarelto 20 mg, states he has not missed a dose,  and simvastatin.   Past Medical History:  Diagnosis Date  . Atypical chest pain   . BPH (benign prostatic hypertrophy)   . DVT (deep venous thrombosis) ([place])   . GERD (gastroesophageal reflux disease)   . HTN (hypertension)   . Hyperlipemia   . Hypogonadism in male   . Insomnia   .  Localized edema   . PE (pulmonary embolism)   . Renal insufficiency   . SOB (shortness of breath)   . Testicular hypofunction     Social History Social History  Substance Use Topics  . Smoking status: Never Smoker  . Smokeless tobacco: Never Used  . Alcohol use No    Family History Family History  Problem Relation Age of Onset  . Hyperlipidemia Mother   . Hyperlipidemia Father   . Heart attack Father   . Healthy Son   . Healthy Brother   . Other Brother   . Healthy Sister   . Healthy Son     Surgical History Past Surgical History:  Procedure Laterality Date  . INGUINAL HERNIA REPAIR      Allergies  Allergen Reactions  . Sulfa Antibiotics Itching  . Amoxapine And Related Hives and Rash  . Lotensin [Benazepril Hcl] Hives and Rash    Current Outpatient Prescriptions  Medication Sig Dispense Refill  . acetaminophen (TYLENOL) 500 MG tablet Take 500-1,000 mg by mouth every 6 (six) hours as needed for mild pain or moderate pain.    Marland Kitchen aluminum-magnesium hydroxide-simethicone (MAALOX) 277-824-23 MG/5ML SUSP Take 15-30 mLs by mouth at bedtime as needed (for indigestion/heartburn).     . CALCIUM PO Take 1 tablet by mouth daily.    Marland Kitchen docusate sodium (COLACE) 100 MG capsule Take 100 mg by mouth daily as needed for mild  constipation.    . fenofibrate 160 MG tablet Take 160 mg by mouth daily.    Marland Kitchen losartan (COZAAR) 100 MG tablet Take 100 mg by mouth daily.    . magnesium oxide (MAG-OX) 400 MG tablet Take 400 mg by mouth every morning.    . Multiple Vitamins-Minerals (ONE-A-DAY MENS 50+ ADVANTAGE) TABS Take 1 tablet by mouth daily with breakfast.    . nitroGLYCERIN (NITROSTAT) 0.4 MG SL tablet Place 0.4 mg under the tongue every 5 (five) minutes as needed. For chest pain    . omeprazole (PRILOSEC) 40 MG capsule Take 40 mg by mouth daily.    . rivaroxaban (XARELTO) 20 MG TABS tablet Take 1 tablet (20 mg total) by mouth daily with supper. 30 tablet 2  . ropinirole (REQUIP) 5 MG  tablet Take 5 mg by mouth at bedtime.    . simvastatin (ZOCOR) 20 MG tablet Take 20 mg by mouth daily.    . tamsulosin (FLOMAX) 0.4 MG CAPS capsule Take 0.4 mg by mouth 2 (two) times daily.     . traMADol (ULTRAM) 50 MG tablet Take 50 mg by mouth every 8 (eight) hours as needed for moderate pain.     Marland Kitchen zolpidem (AMBIEN) 10 MG tablet Take 10 mg by mouth at bedtime as needed for sleep.     No current facility-administered medications for this visit.     REVIEW OF SYSTEMS: see HPI for pertinent positives and negatives   Physical Examination  Vitals:   06/22/16 1513  BP: 140/82  Pulse: (!) 58  Resp: 18  Temp: 97.8 F (36.6 C)  TempSrc: Oral  SpO2: 100%  Weight: 192 lb (87.1 kg)  Height: 5\' 8"  (1.727 m)   Body mass index is 29.19 kg/m.  General: The patient appears their stated age.   HEENT:  No gross abnormalities Pulmonary: Respirations are non-labored. BBS are CTAB with good air movement, no rales, rhonchi, or wheezing. Abdomen: Soft and non-tender with nornmal pitched bowel sounds. Musculoskeletal: There are no major deformities.   Neurologic: No focal weakness or paresthesias are detected. Skin: There are no ulcers. Plantar surfaces of feet and toes are cyanotic when dependent, pink when feet are at heart level. Mild pruritic rash at left lower leg. Psychiatric: The patient has normal affect. Cardiovascular: There is a regular rate and rhythm without significant murmur appreciated. Bilateral DP and PT pulses are 2+ palpable. Feet are cool to touch.     Medical Decision Making  Adrian Franco is a 70 y.o. male who presents with: partially resolving left lower extremity extensive DVT.  Left leg venous duplex today demonstrates some remaining extensive DVT, but much of it has resolved.   Dr. Donzetta Matters spoke with pt and wife and examined pt. Continue 20 mg Xarelto for 6 months, to April 2018.  Pt should remain on a daily ASA for his lifetime.   Pt and wife are concerned  that pt's very low energy is due to him not receiving any more testosterone injections. Apparently this was eliminated as a potential aggravating factor for the development of his left leg DVT. Dr. Donzetta Matters states that from his perspective, the testosterone may be resumed; Dr. Donzetta Matters does not think this is a potential aggravating factor for development of DVT; but he defers to pt's medical providers .   Based on the patient's vascular studies and examination, I have offered the patient: follow up prn.  I discussed with the patient the use of her 20-30 mm thigh high  compression stockings during the day.   Thank you for allowing [place] to participate in this patient's care.  NICKEL, Sharmon Leyden, RN, MSN, FNP-C [facility] Office: [phone]  06/22/2016, 3:41 PM  Clinic MD: Donzetta Matters

## 2016-07-01 ENCOUNTER — Other Ambulatory Visit: Payer: Self-pay | Admitting: Internal Medicine

## 2016-07-12 ENCOUNTER — Encounter: Payer: Self-pay | Admitting: Vascular Surgery

## 2016-07-13 ENCOUNTER — Other Ambulatory Visit: Payer: Self-pay | Admitting: Vascular Surgery

## 2016-07-13 ENCOUNTER — Inpatient Hospital Stay (INDEPENDENT_AMBULATORY_CARE_PROVIDER_SITE_OTHER)
Admission: RE | Admit: 2016-07-13 | Discharge: 2016-07-13 | Disposition: A | Discharge status: Home / Self Care | Payer: Medicare Other | Source: Ambulatory Visit | Attending: Vascular Surgery | Admitting: Vascular Surgery

## 2016-07-13 ENCOUNTER — Ambulatory Visit: Payer: Medicare Other | Admitting: Vascular Surgery

## 2016-07-13 DIAGNOSIS — Z86718 Personal history of other venous thrombosis and embolism: Secondary | ICD-10-CM

## 2016-07-13 DIAGNOSIS — I824Y9 Acute embolism and thrombosis of unspecified deep veins of unspecified proximal lower extremity: Secondary | ICD-10-CM

## 2016-10-12 NOTE — Progress Notes (Deleted)
Cardiology Office Note    Date:  10/12/2016   ID:  Adrian Franco, DOB 05/09/1946, MRN [id number]  PCP:  Ephriam Jenkins E  Cardiologist: [name]   CC:  Follow up  History of Present Illness:  Adrian Franco is a 71 y.o. . male with a history of low testosterone on supplementation, HLD, HTN, CKD, LLE DVT and submassive PE (diagnosed early 03/2016), and mod AR who presents to clinic for follow up  First seen by me on 03/20/16 for atypical chest pain, fatigue and dyspnea. He also complained of pain and swelling in the LLE. [name] ordered an exercise myoview which was negative for ischemia. He also ordered a 2D ECHO which showed normal LV function, no WMAs, normal diastolic function, mod AR and mild LAE. LE venous dopplers were ordered which showed an acute LLE DVT. He was told to go to the ER immediately for CT.  He was admitted from 8/4-03/31/16 for a LLE DVT and submassive PE. CTA showed small subsegmental pulmonary embolisms with no signs of heart strain on hemodynamics, ECG, or cardiac biomarkers.  His Depo-Testosterone was discontinued was this was felt to possibly be the provoking cause and he was started on Xarelto. Aspirin was also stopped for primary prevention.   He was seen in the [facility] ER on 04/12/16 for severe leg pain and swelling, which resolved by the time of evaluation in ER. LE duplex showed persistent DVT. He was sent to Dr. Donzetta Matters with [facility] who saw him in the office on 04/13/16. He recommended continuing Xarelto, ambulation and over the counter compression. He felt the DVT was unprovoked and said that he should be on at least 3 months of [place] if not longer. He will see him in follow up in 3 months for another LLE venous duplex.     Past Medical History:  Diagnosis Date  . Atypical chest pain   . BPH (benign prostatic hypertrophy)   . DVT (deep venous thrombosis) ([place])   . GERD (gastroesophageal reflux disease)   . HTN (hypertension)   . Hyperlipemia   . Hypogonadism in  male   . Insomnia   . Localized edema   . PE (pulmonary embolism)   . Renal insufficiency   . SOB (shortness of breath)   . Testicular hypofunction     Past Surgical History:  Procedure Laterality Date  . INGUINAL HERNIA REPAIR      Current Medications: Outpatient Medications Prior to Visit  Medication Sig Dispense Refill  . acetaminophen (TYLENOL) 500 MG tablet Take 500-1,000 mg by mouth every 6 (six) hours as needed for mild pain or moderate pain.    Marland Kitchen aluminum-magnesium hydroxide-simethicone (MAALOX) [phone] MG/5ML SUSP Take 15-30 mLs by mouth at bedtime as needed (for indigestion/heartburn).     . CALCIUM PO Take 1 tablet by mouth daily.    Marland Kitchen docusate sodium (COLACE) 100 MG capsule Take 100 mg by mouth daily as needed for mild constipation.    . fenofibrate 160 MG tablet Take 160 mg by mouth daily.    Marland Kitchen losartan (COZAAR) 100 MG tablet Take 100 mg by mouth daily.    . magnesium oxide (MAG-OX) 400 MG tablet Take 400 mg by mouth every morning.    . Multiple Vitamins-Minerals (ONE-A-DAY MENS 50+ ADVANTAGE) TABS Take 1 tablet by mouth daily with breakfast.    . nitroGLYCERIN (NITROSTAT) 0.4 MG SL tablet Place 0.4 mg under the tongue every 5 (five) minutes as needed. For chest pain    .  omeprazole (PRILOSEC) 40 MG capsule Take 40 mg by mouth daily.    . rivaroxaban (XARELTO) 20 MG TABS tablet Take 1 tablet (20 mg total) by mouth daily with supper. 30 tablet 2  . ropinirole (REQUIP) 5 MG tablet Take 5 mg by mouth at bedtime.    . simvastatin (ZOCOR) 20 MG tablet Take 20 mg by mouth daily.    . tamsulosin (FLOMAX) 0.4 MG CAPS capsule Take 0.4 mg by mouth 2 (two) times daily.     . traMADol (ULTRAM) 50 MG tablet Take 50 mg by mouth every 8 (eight) hours as needed for moderate pain.     Marland Kitchen zolpidem (AMBIEN) 10 MG tablet Take 10 mg by mouth at bedtime as needed for sleep.     No facility-administered medications prior to visit.      Allergies:   Sulfa antibiotics; Amoxapine and  related; and Lotensin [benazepril hcl]   Social History   Social History  . Marital status: Married    Spouse name: N/A  . Number of children: N/A  . Years of education: N/A   Social History Main Topics  . Smoking status: Never Smoker  . Smokeless tobacco: Never Used  . Alcohol use No  . Drug use: No  . Sexual activity: Yes     Comment: married   Other Topics Concern  . Not on file   Social History Narrative  . No narrative on file     Family History:  The patient'sfamily history includes Healthy in his brother, sister, son, and son; Heart attack in his father; Hyperlipidemia in his father and mother; Other in his brother.     ROS:   Please see the history of present illness.    ROS All other systems reviewed and are negative.   PHYSICAL EXAM:   VS:  There were no vitals taken for this visit.   GEN: Well nourished, well developed, in no acute distress  HEENT: normal  Neck: no JVD, carotid bruits, or masses Cardiac: RRR; no murmurs, rubs, or gallops, LLE edema  Respiratory:  clear to auscultation bilaterally, normal work of breathing GI: soft, nontender, nondistended, + BS MS: no deformity or atrophy  Skin: warm and dry, no rash Neuro:  Alert and Oriented x 3, Strength and sensation are intact Psych: euthymic mood, full affect  Wt Readings from Last 3 Encounters:  06/22/16 192 lb (87.1 kg)  04/20/16 193 lb (87.5 kg)  04/13/16 191 lb (86.6 kg)      Studies/Labs Reviewed:   EKG:  EKG is NOT ordered today.    Recent Labs: 04/12/2016: BUN 20; Creatinine, Ser 1.47; Hemoglobin 15.3; Platelets 269; Potassium 4.5; Sodium 137 04/20/2016: Magnesium 2.1   Lipid Panel No results found for: CHOL, TRIG, HDL, CHOLHDL, VLDL, LDLCALC, LDLDIRECT  Additional studies/ records that were reviewed today include:  04/05/16 Study Highlights    Nuclear stress EF: 55%.  Blood pressure demonstrated a hypertensive response to exercise.  There was no ST segment deviation noted  during stress.  No T wave inversion was noted during stress.  The study is normal.  This is a low risk study.  The left ventricular ejection fraction is normal (55-65%).     2D ECHO: 04/05/2016 LV EF: 55% -   60% Study Conclusions - Left ventricle: The cavity size was normal. Wall thickness was   normal. Systolic function was normal. The estimated ejection   fraction was in the range of 55% to 60%. Wall motion was normal;  there were no regional wall motion abnormalities. Left   ventricular diastolic function parameters were normal. - Aortic valve: There was mild to moderate regurgitation directed   eccentrically in the LVOT and towards the mitral anterior   leaflet. - Left atrium: The atrium was mildly dilated.   ASSESSMENT & PLAN:  Adrian Franco is a 71 y.o. 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  DVT/PE:  Seen by [facility] 06/22/16 told to continue Xarelto until April 2018 and daily aspirin  Duplex 10/27 showed continued  Popliteal thrombosis with minimal flow and new superficial phlebitis Also thrombosis of posterior calf vein and profunda  HTN: BP well controlled on current therapy  HLD: continue Zocor  Mild-mod AR: he is worried about this because his father had his aortic valve replaced. I reviewed his echo, he has a trileaflet valve. I reassured him that many people have leaky valves and we would just follow it over time. Follow up echo in 1 year.   Chest pain: still has some atypical sharp stabbing pain. Recent reassuring exercise myoview. No further work up at this time.   CKD: creat 1.47. He had stopped taking Meloxicam  LE cramping: wife said she has low mag and wants to know if we can check this. K has been good on most recent labs  Low T: Dr Donzetta Matters did not think that this was ppt factor in DVT will defer to  primary    Medication Adjustments/Labs and Tests Ordered: Current medicines are reviewed at length with the patient today.  Concerns regarding medicines are outlined above.  Medication changes, Labs and Tests ordered today are listed in the Patient Instructions below. There are no Patient Instructions on file for this visit.   Signed, Jenkins Rouge, MD  10/12/2016 3:57 PM    [facility] [address], [place], [place]  [address] Phone: [phone]; Fax: [phone]

## 2016-10-15 ENCOUNTER — Telehealth: Payer: Self-pay

## 2016-10-15 DIAGNOSIS — Z86718 Personal history of other venous thrombosis and embolism: Secondary | ICD-10-CM

## 2016-10-15 NOTE — Telephone Encounter (Signed)
Ordered referral per [name].

## 2016-10-15 NOTE — Telephone Encounter (Signed)
-----   Message from Josue Hector, MD sent at 10/14/2016 11:48 AM EST ----- Thanks for the thoughtful replay Adrian Franco will ask my nurse to make referral back to you at [facility]    ----- Message ----- From: Waynetta Sandy, MD Sent: 10/13/2016   1:54 PM To: Josue Hector, MD  Adrian Franco is a difficult case. I think the argument could be made for long term anticoagulation and certainly for stopping his testosterone but if I recall correctly he was miserable without it. I think I told him there is some data to suggest testosterone can cause dvt and other thrombotic complications but I am not aware of any large studies to date. Possibly he would benefit from thrombophilia testing prior to discontinuation of anticoagulation. I was also suspect that he has a mechanical component but did not identify any compression syndrome on duplex though he might also benefit from venography with intravascular [place] especially if he is has post thrombotic syndrome at this point. Compression stockings and possible superficial vein ablation could help his symptoms. If you see fit and he is willing then he can f/u in our office and i'll be sure to be the provider seeing him this time. Thank you for the update on this patient.  bc  ----- Message ----- From: Josue Hector, MD Sent: 10/12/2016   3:57 PM To: Waynetta Sandy, MD  Adrian Franco- your NP's note indicates stopping his xarelto in April. Duplex showed ? Extension of thrombosis to profunda with superficial phlebitis and thrombotic occlusion of popliteal. Also indicates that you did not think Testosterone was ppt for DVT so this would make DVT unprovoked. ? Thoughts on life long anticoagulation And is there anything else we can do to mitigate post phlebitic syndrome ?  Thanks hope you are doing well

## 2016-10-19 ENCOUNTER — Encounter: Payer: Self-pay | Admitting: Cardiovascular Disease

## 2016-10-19 ENCOUNTER — Encounter (INDEPENDENT_AMBULATORY_CARE_PROVIDER_SITE_OTHER): Payer: Self-pay

## 2016-10-19 ENCOUNTER — Ambulatory Visit (INDEPENDENT_AMBULATORY_CARE_PROVIDER_SITE_OTHER): Payer: Medicare Other | Admitting: Cardiovascular Disease

## 2016-10-19 VITALS — BP 102/60 | HR 77 | Ht 69.0 in | Wt 195.8 lb

## 2016-10-19 DIAGNOSIS — I351 Nonrheumatic aortic (valve) insufficiency: Secondary | ICD-10-CM | POA: Diagnosis not present

## 2016-10-19 MED ORDER — RIVAROXABAN 20 MG PO TABS
20.0000 mg | ORAL_TABLET | Freq: Every day | ORAL | 3 refills | Status: DC
Start: 2016-10-19 — End: 2021-09-01

## 2016-10-19 NOTE — Patient Instructions (Addendum)

## 2016-10-19 NOTE — Progress Notes (Signed)
Cardiology Office Note    Date:  10/19/2016   ID:  Adrian Franco, DOB 12/24/1945, MRN [id number]  PCP:  Ephriam Jenkins E  Cardiologist: [name]   CC:  Follow up  History of Present Illness:  Adrian Franco is a 71 y.o. 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 He also complained of pain and swelling in the LLE. [name] ordered an exercise myoview which was negative for ischemia. He also ordered a 2D ECHO which showed normal LV function, no WMAs, normal diastolic function, mod AR and mild LAE. LE venous dopplers were ordered which showed an acute LLE DVT. He was told to go to the ER immediately for CT.  He was admitted from 8/4-03/31/16 for a LLE DVT and submassive PE. CTA showed small subsegmental pulmonary embolisms with no signs of heart strain on hemodynamics, ECG, or cardiac biomarkers.  His Depo-Testosterone was discontinued was this was felt to possibly be the provoking cause and he was started on Xarelto. Aspirin was also stopped for primary prevention.   He was seen in the [facility] ER on 04/12/16 for severe leg pain and swelling, which resolved by the time of evaluation in ER. LE duplex showed persistent DVT. He was sent to Dr. Donzetta Matters with [facility] who saw him in the office on 04/13/16. He recommended continuing Xarelto, ambulation and over the counter compression. He felt the DVT was unprovoked and said that he should be on at least 3 months of [place] if not longer. He will see him in follow up in 3 months for another LLE venous duplex.   He has some LE cramping. He has some mild chest pain similar to previous. He still has some SOB with exertion. He has continued LLE edema and wearing a compression stocking. No orthopnea or PND. No dizziness or syncope. No  palpitations. No exertional leg pain. Overall doing well but has some fatigue.    Past Medical History:  Diagnosis Date  . Atypical chest pain   . BPH (benign prostatic hypertrophy)   . DVT (deep venous thrombosis) ([place])   . GERD (gastroesophageal reflux disease)   . HTN (hypertension)   . Hyperlipemia   . Hypogonadism in male   . Insomnia   . Localized edema   . PE (pulmonary embolism)   . Renal insufficiency   . SOB (shortness of breath)   . Testicular hypofunction     Past Surgical History:  Procedure Laterality Date  . INGUINAL HERNIA REPAIR      Current Medications: Outpatient Medications Prior to Visit  Medication Sig Dispense Refill  . acetaminophen (TYLENOL) 500 MG tablet Take 500-1,000 mg by mouth every 6 (six) hours as needed for mild pain or moderate pain.    Marland Kitchen aluminum-magnesium hydroxide-simethicone (MAALOX) 035-009-38 MG/5ML SUSP Take 15-30 mLs by mouth at bedtime as needed (for indigestion/heartburn).     . CALCIUM PO Take 1 tablet by mouth daily.    Marland Kitchen docusate sodium (COLACE) 100 MG capsule Take 100 mg by mouth daily as needed for mild constipation.    . fenofibrate 160 MG tablet Take 160 mg by mouth daily.    Marland Kitchen losartan (COZAAR) 100 MG tablet Take 100 mg by mouth daily.    Marland Kitchen  magnesium oxide (MAG-OX) 400 MG tablet Take 400 mg by mouth every morning.    . Multiple Vitamins-Minerals (ONE-A-DAY MENS 50+ ADVANTAGE) TABS Take 1 tablet by mouth daily with breakfast.    . nitroGLYCERIN (NITROSTAT) 0.4 MG SL tablet Place 0.4 mg under the tongue every 5 (five) minutes as needed. For chest pain    . omeprazole (PRILOSEC) 40 MG capsule Take 40 mg by mouth daily.    . ropinirole (REQUIP) 5 MG tablet Take 5 mg by mouth at bedtime.    . simvastatin (ZOCOR) 20 MG tablet Take 20 mg by mouth daily.    . tamsulosin (FLOMAX) 0.4 MG CAPS capsule Take 0.4 mg by mouth 2 (two) times daily.     . traMADol (ULTRAM) 50 MG tablet Take 50 mg by mouth every 8 (eight) hours as needed for  moderate pain.     Marland Kitchen zolpidem (AMBIEN) 10 MG tablet Take 10 mg by mouth at bedtime as needed for sleep.    . rivaroxaban (XARELTO) 20 MG TABS tablet Take 1 tablet (20 mg total) by mouth daily with supper. 30 tablet 2   No facility-administered medications prior to visit.      Allergies:   Sulfa antibiotics; Amoxapine and related; and Lotensin [benazepril hcl]   Social History   Social History  . Marital status: Married    Spouse name: N/A  . Number of children: N/A  . Years of education: N/A   Social History Main Topics  . Smoking status: Never Smoker  . Smokeless tobacco: Never Used  . Alcohol use No  . Drug use: No  . Sexual activity: Yes     Comment: married   Other Topics Concern  . None   Social History Narrative  . None     Family History:  The patient'sfamily history includes Healthy in his brother, sister, son, and son; Heart attack in his father; Hyperlipidemia in his father and mother; Other in his brother.     ROS:   Please see the history of present illness.    ROS All other systems reviewed and are negative.   PHYSICAL EXAM:   VS:  BP 102/60   Pulse 77   Ht 5\' 9"  (1.753 m)   Wt 195 lb 12.8 oz (88.8 kg)   SpO2 97%   BMI 28.91 kg/m    GEN: Well nourished, well developed, in no acute distress  HEENT: normal  Neck: no JVD, carotid bruits, or masses Cardiac: RRR; no murmurs, rubs, or gallops, LLE edema  Respiratory:  clear to auscultation bilaterally, normal work of breathing GI: soft, nontender, nondistended, + BS MS: no deformity or atrophy  Skin: warm and dry, no rash Neuro:  Alert and Oriented x 3, Strength and sensation are intact Psych: euthymic mood, full affect  Wt Readings from Last 3 Encounters:  10/19/16 195 lb 12.8 oz (88.8 kg)  06/22/16 192 lb (87.1 kg)  04/20/16 193 lb (87.5 kg)      Studies/Labs Reviewed:   EKG:  EKG is NOT ordered today.    Recent Labs: 04/12/2016: BUN 20; Creatinine, Ser 1.47; Hemoglobin 15.3; Platelets  269; Potassium 4.5; Sodium 137 04/20/2016: Magnesium 2.1   Lipid Panel No results found for: CHOL, TRIG, HDL, CHOLHDL, VLDL, LDLCALC, LDLDIRECT  Additional studies/ records that were reviewed today include:  04/05/16 Study Highlights    Nuclear stress EF: 55%.  Blood pressure demonstrated a hypertensive response to exercise.  There was no ST segment deviation noted during stress.  No T wave inversion was noted during stress.  The study is normal.  This is a low risk study.  The left ventricular ejection fraction is normal (55-65%).     2D ECHO: 04/05/2016 LV EF: 55% -   60% Study Conclusions - Left ventricle: The cavity size was normal. Wall thickness was   normal. Systolic function was normal. The estimated ejection   fraction was in the range of 55% to 60%. Wall motion was normal;   there were no regional wall motion abnormalities. Left   ventricular diastolic function parameters were normal. - Aortic valve: There was mild to moderate regurgitation directed   eccentrically in the LVOT and towards the mitral anterior   leaflet. - Left atrium: The atrium was mildly dilated.   ASSESSMENT & PLAN:  Adrian Franco is a 71 y.o. 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  DVT/PE: wearing compression stocking on left leg but it is too long. I have provided him a Rx for stockings. He will see Dr. Donzetta Matters with [facility] back in 3 months for repeat LLE venous duplex. He will need to be on Xarelto for at least 3-6 months. 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 Donzetta Matters called it unprovoked. I discussed with him staying on xarelto for life to prevent post phlebitic syndrome and be able to take a possibly pro thrombotic testosterone supplement   HTN: BP well controlled on current  therapy  HLD: continue Zocor  Mild-mod AR: he is worried about this because his father had his aortic valve replaced. I reviewed his echo, he has a trileaflet valve. I reassured him that many people have leaky valves and we would just follow it over time. Follow up echo in  August    Chest pain: still has some atypical sharp stabbing pain. Recent reassuring exercise myoview. No further work up at this time.   CKD: creat 1.47. He had stopped taking Meloxicam  LE cramping: wife said she has low mag and wants to know if we can check this. K has been good on most recent labs  Low T: he and wife are worried that he will feel badly off his supplementation. I am not sure if this was the inciting cause of his DVT/PE, but if he does end up feeling badly off the testosterone therapy and his PCP thinks he needs it long term, he may need to continue his oral anticoagulation.    Medication Adjustments/Labs and Tests Ordered: Current medicines are reviewed at length with the patient today.  Concerns regarding medicines are outlined above.  Medication changes, Labs and Tests ordered today are listed in the Patient Instructions below. Patient Instructions  Medication Instructions:  Your physician recommends that you continue on your current medications as directed. Please refer to the Current Medication list given to you today.  Labwork: NONE  Testing/Procedures: Your physician has requested that you have an echocardiogram in 6 months. Echocardiography is a painless test that uses sound waves to create images of your heart. It provides your doctor with information about the size and shape of your heart and how well your heart's chambers and valves are working. This procedure takes approximately one hour. There are no restrictions for this procedure.  Follow-Up: Your physician wants you to follow-up in: 6 months with [name].  You will receive a reminder letter in the mail two months in advance. If you  don't receive a letter, please call our office to schedule the follow-up appointment.   If you need a refill on your cardiac medications before your next appointment, please call your pharmacy.       Signed, Jenkins Rouge, MD  10/19/2016 4:15 PM    [facility] [address], [place], [place]  [address] Phone: [phone]; Fax: [phone]

## 2016-10-23 ENCOUNTER — Ambulatory Visit: Payer: Medicare Other | Admitting: Cardiovascular Disease

## 2016-12-03 ENCOUNTER — Encounter: Payer: Self-pay | Admitting: Vascular Surgery

## 2016-12-14 ENCOUNTER — Ambulatory Visit (HOSPITAL_COMMUNITY)
Admission: RE | Admit: 2016-12-14 | Discharge: 2016-12-14 | Disposition: A | Discharge status: Home / Self Care | Payer: Medicare Other | Source: Ambulatory Visit | Attending: Vascular Surgery | Admitting: Vascular Surgery

## 2016-12-14 ENCOUNTER — Ambulatory Visit (INDEPENDENT_AMBULATORY_CARE_PROVIDER_SITE_OTHER): Payer: Medicare Other | Admitting: Vascular Surgery

## 2016-12-14 ENCOUNTER — Encounter: Payer: Self-pay | Admitting: Vascular Surgery

## 2016-12-14 VITALS — BP 142/89 | HR 68 | Temp 97.6°F | Resp 16 | Ht 68.0 in | Wt 200.0 lb

## 2016-12-14 DIAGNOSIS — I824Y9 Acute embolism and thrombosis of unspecified deep veins of unspecified proximal lower extremity: Secondary | ICD-10-CM | POA: Diagnosis present

## 2016-12-14 DIAGNOSIS — I82412 Acute embolism and thrombosis of left femoral vein: Secondary | ICD-10-CM | POA: Diagnosis not present

## 2016-12-14 DIAGNOSIS — I824Z2 Acute embolism and thrombosis of unspecified deep veins of left distal lower extremity: Secondary | ICD-10-CM

## 2016-12-14 NOTE — Progress Notes (Signed)
Patient ID: Adrian Franco, male   DOB: June 06, 1946, 71 y.o.   MRN: [id number]  Reason for Consult: DVT   Referred by Thea Alken, FNP  Subjective:     HPI:  Adrian Franco is a 71 y.o. male with history of extensive left lower extremity deep venous thrombosis who has been evaluated in this office on 2 occasions and now is continued on Xarelto. His only complaint is minimal left lower extremity swelling although he does also have some right lower extremity swelling. He is religious with compression stockings and has been on Xarelto now since diagnosis in August. His only risk factor the time was possibly testosterone which he is now restarting. His Xarelto is managed by his primary care doctor who he is risks the switched. At this time he is having no symptoms other than swelling of his bilateral lower extremities. He does have some spider veins on his leg. Reflux studies performed today prior to office visit.  Past Medical History:  Diagnosis Date  . Atypical chest pain   . BPH (benign prostatic hypertrophy)   . DVT (deep venous thrombosis) ([place])   . GERD (gastroesophageal reflux disease)   . HTN (hypertension)   . Hyperlipemia   . Hypogonadism in male   . Insomnia   . Localized edema   . PE (pulmonary embolism)   . Renal insufficiency   . SOB (shortness of breath)   . Testicular hypofunction    Family History  Problem Relation Age of Onset  . Hyperlipidemia Mother   . Hyperlipidemia Father   . Heart attack Father   . Healthy Son   . Healthy Brother   . Other Brother   . Healthy Sister   . Healthy Son    Past Surgical History:  Procedure Laterality Date  . INGUINAL HERNIA REPAIR      Short Social History:  Social History  Substance Use Topics  . Smoking status: Never Smoker  . Smokeless tobacco: Never Used  . Alcohol use No    Allergies  Allergen Reactions  . Sulfa Antibiotics Itching  . Amoxapine And Related Hives and Rash  . Lotensin [Benazepril  Hcl] Hives and Rash    Current Outpatient Prescriptions  Medication Sig Dispense Refill  . acetaminophen (TYLENOL) 500 MG tablet Take 500-1,000 mg by mouth every 6 (six) hours as needed for mild pain or moderate pain.    Marland Kitchen aluminum-magnesium hydroxide-simethicone (MAALOX) [phone] MG/5ML SUSP Take 15-30 mLs by mouth at bedtime as needed (for indigestion/heartburn).     . CALCIUM PO Take 1 tablet by mouth daily.    Marland Kitchen docusate sodium (COLACE) 100 MG capsule Take 100 mg by mouth daily as needed for mild constipation.    . fenofibrate 160 MG tablet Take 160 mg by mouth daily.    Marland Kitchen losartan (COZAAR) 100 MG tablet Take 100 mg by mouth daily.    . magnesium oxide (MAG-OX) 400 MG tablet Take 400 mg by mouth every morning.    . Multiple Vitamins-Minerals (ONE-A-DAY MENS 50+ ADVANTAGE) TABS Take 1 tablet by mouth daily with breakfast.    . nitroGLYCERIN (NITROSTAT) 0.4 MG SL tablet Place 0.4 mg under the tongue every 5 (five) minutes as needed. For chest pain    . omeprazole (PRILOSEC) 40 MG capsule Take 40 mg by mouth daily.    . rivaroxaban (XARELTO) 20 MG TABS tablet Take 1 tablet (20 mg total) by mouth daily with supper. 90 tablet 3  . ropinirole (REQUIP)  5 MG tablet Take 5 mg by mouth at bedtime.    . simvastatin (ZOCOR) 20 MG tablet Take 20 mg by mouth daily.    . tamsulosin (FLOMAX) 0.4 MG CAPS capsule Take 0.4 mg by mouth 2 (two) times daily.     . traMADol (ULTRAM) 50 MG tablet Take 50 mg by mouth every 8 (eight) hours as needed for moderate pain.     Marland Kitchen zolpidem (AMBIEN) 10 MG tablet Take 10 mg by mouth at bedtime as needed for sleep.     No current facility-administered medications for this visit.     Review of Systems  Constitutional:  Constitutional negative. Cardiovascular: Positive for leg swelling.  Musculoskeletal: Musculoskeletal negative.  Skin: Positive for rash.  Neurological: Neurological negative.       Objective:  Objective   Vitals:   12/14/16 1149 12/14/16 1150    BP: (!) 151/85 (!) 142/89  Pulse: 68   Resp: 16   Temp: 97.6 F (36.4 C)   SpO2: 97%   Weight: 200 lb (90.7 kg)   Height: 5\' 8"  (1.727 m)    Body mass index is 30.41 kg/m.  Physical Exam  Constitutional: He is oriented to person, place, and time. He appears well-developed.  Eyes: Pupils are equal, round, and reactive to light.  Neck: Normal range of motion.  Cardiovascular: Normal rate.   Pulses:      Popliteal pulses are 2+ on the right side, and 2+ on the left side.       Posterior tibial pulses are 2+ on the right side, and 2+ on the left side.  Pulmonary/Chest: Effort normal.  Abdominal: Soft. He exhibits mass.  Musculoskeletal: Normal range of motion. He exhibits edema.  Neurological: He is alert and oriented to person, place, and time.  Skin: Skin is warm and dry.  Psychiatric: He has a normal mood and affect. His behavior is normal. Judgment and thought content normal.    Data: I interpreted lower extremity reflux tests today as having deep reflux but no superficial reflux. Chronic appearing dvt in distal femoral and popliteal veins.      Assessment/Plan:     71yo WM returns after having significant lle dvt that other than testosterone therapy was unprovoked. He has minimal symptoms at this time with villalta <5 suggesting no post-thrombotic syndrome. We discussed wearing compression stockings as well as leg elevation and he demonstrates good understanding. We also discussed that he merited at least 6 months of anticoagulation as long as the risk factors had been modified but with testosterone therapy he is going to continue which is certainly reasonable in his case. He should take antiplatelet for life even if he stops xarelto in the future. He can f/u here on a prn basis.     Waynetta Sandy MD [facility]

## 2017-04-11 ENCOUNTER — Other Ambulatory Visit (HOSPITAL_COMMUNITY): Payer: Medicare Other

## 2017-04-15 NOTE — Progress Notes (Signed)
Cardiology Office Note    Date:  04/17/2017   ID:  Adrian Franco, DOB Jul 10, 1946, MRN [id number]  PCP:  System, Pcp Not In  Cardiologist: [name]   CC:  Follow up  History of Present Illness:  Adrian Franco is a 71 y.o. 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 He also complained of pain and swelling in the LLE. [name] ordered an exercise myoview which was negative for ischemia. He also ordered a 2D ECHO which showed normal LV function, no WMAs, normal diastolic function, mod AR and mild LAE. LE venous dopplers were ordered which showed an acute LLE DVT. He was told to go to the ER immediately for CT.  He was admitted from 8/4-03/31/16 for a LLE DVT and submassive PE. CTA showed small subsegmental pulmonary embolisms with no signs of heart strain on hemodynamics, ECG, or cardiac biomarkers.  His Depo-Testosterone was discontinued was this was felt to possibly be the provoking cause and he was started on Xarelto. Aspirin was also stopped for primary prevention.   He was seen in the [facility] ER on 04/12/16 for severe leg pain and swelling, which resolved by the time of evaluation in ER. LE duplex showed persistent DVT. He was sent to Dr. Donzetta Matters with [facility] who saw him in the office on 04/13/16. He recommended continuing Xarelto, ambulation and over the counter compression. He felt the DVT was unprovoked and said that he should be on at least 3 months of [place] if not longer. He will see him in follow up in 3 months for another LLE venous duplex.   He has some LE cramping. He has some mild chest pain similar to previous. He still has some SOB with exertion. He has continued LLE edema and wearing a compression stocking. No orthopnea or PND. No dizziness or syncope. No  palpitations. No exertional leg pain. Overall doing well but has some fatigue.   Echo reviewed from today and LV normal with mild to moderate AR   Past Medical History:  Diagnosis Date  . Atypical chest pain   . BPH (benign prostatic hypertrophy)   . DVT (deep venous thrombosis) ([place])   . GERD (gastroesophageal reflux disease)   . HTN (hypertension)   . Hyperlipemia   . Hypogonadism in male   . Insomnia   . Localized edema   . PE (pulmonary embolism)   . Renal insufficiency   . SOB (shortness of breath)   . Testicular hypofunction     Past Surgical History:  Procedure Laterality Date  . INGUINAL HERNIA REPAIR      Current Medications: Outpatient Medications Prior to Visit  Medication Sig Dispense Refill  . acetaminophen (TYLENOL) 500 MG tablet Take 500-1,000 mg by mouth every 6 (six) hours as needed for mild pain or moderate pain.    Marland Kitchen aluminum-magnesium hydroxide-simethicone (MAALOX) 650-354-65 MG/5ML SUSP Take 15-30 mLs by mouth at bedtime as needed (for indigestion/heartburn).     . CALCIUM PO Take 1 tablet by mouth daily.    Marland Kitchen docusate sodium (COLACE) 100 MG capsule Take 100 mg by mouth daily as needed for mild constipation.    . fenofibrate 160 MG tablet Take 160 mg by mouth daily.    Marland Kitchen losartan (  COZAAR) 100 MG tablet Take 100 mg by mouth daily.    . magnesium oxide (MAG-OX) 400 MG tablet Take 400 mg by mouth every morning.    . Multiple Vitamins-Minerals (ONE-A-DAY MENS 50+ ADVANTAGE) TABS Take 1 tablet by mouth daily with breakfast.    . nitroGLYCERIN (NITROSTAT) 0.4 MG SL tablet Place 0.4 mg under the tongue every 5 (five) minutes as needed. For chest pain    . omeprazole (PRILOSEC) 40 MG capsule Take 40 mg by mouth daily.    . rivaroxaban (XARELTO) 20 MG TABS tablet Take 1 tablet (20 mg total) by mouth daily with supper. 90 tablet 3  . ropinirole (REQUIP) 5 MG tablet Take 5 mg by mouth at bedtime.    . simvastatin (ZOCOR) 20 MG tablet Take 20 mg by mouth daily.    .  tamsulosin (FLOMAX) 0.4 MG CAPS capsule Take 0.4 mg by mouth 2 (two) times daily.     . traMADol (ULTRAM) 50 MG tablet Take 50 mg by mouth every 8 (eight) hours as needed for moderate pain.     Marland Kitchen zolpidem (AMBIEN) 10 MG tablet Take 10 mg by mouth at bedtime as needed for sleep.     No facility-administered medications prior to visit.      Allergies:   Sulfa antibiotics; Amoxapine and related; and Lotensin [benazepril hcl]   Social History   Social History  . Marital status: Married    Spouse name: N/A  . Number of children: N/A  . Years of education: N/A   Social History Main Topics  . Smoking status: Never Smoker  . Smokeless tobacco: Never Used  . Alcohol use No  . Drug use: No  . Sexual activity: Yes     Comment: married   Other Topics Concern  . None   Social History Narrative  . None     Family History:  The patient'sfamily history includes Healthy in his brother, sister, son, and son; Heart attack in his father; Hyperlipidemia in his father and mother; Other in his brother.     ROS:   Please see the history of present illness.    ROS All other systems reviewed and are negative.   PHYSICAL EXAM:   VS:  BP 136/82   Pulse (!) 51   Ht 5\' 8"  (1.727 m)   Wt 200 lb (90.7 kg)   BMI 30.41 kg/m    GEN: Well nourished, well developed, in no acute distress  HEENT: normal  Neck: no JVD, carotid bruits, or masses Cardiac: RRR; no murmurs, rubs, or gallops, LLE edema  Respiratory:  clear to auscultation bilaterally, normal work of breathing GI: soft, nontender, nondistended, + BS MS: no deformity or atrophy  Skin: warm and dry, no rash Neuro:  Alert and Oriented x 3, Strength and sensation are intact Psych: euthymic mood, full affect  Wt Readings from Last 3 Encounters:  04/17/17 200 lb (90.7 kg)  12/14/16 200 lb (90.7 kg)  10/19/16 195 lb 12.8 oz (88.8 kg)      Studies/Labs Reviewed:   EKG:  SR rate 51 PR 238 normal otherwise   Recent Labs: 04/20/2016:  Magnesium 2.1   Lipid Panel No results found for: CHOL, TRIG, HDL, CHOLHDL, VLDL, LDLCALC, LDLDIRECT  Additional studies/ records that were reviewed today include:  04/05/16 Study Highlights    Nuclear stress EF: 55%.  Blood pressure demonstrated a hypertensive response to exercise.  There was no ST segment deviation noted during stress.  No T wave inversion  was noted during stress.  The study is normal.  This is a low risk study.  The left ventricular ejection fraction is normal (55-65%).     2D ECHO: 04/05/2016 LV EF: 55% -   60% Study Conclusions - Left ventricle: The cavity size was normal. Wall thickness was   normal. Systolic function was normal. The estimated ejection   fraction was in the range of 55% to 60%. Wall motion was normal;   there were no regional wall motion abnormalities. Left   ventricular diastolic function parameters were normal. - Aortic valve: There was mild to moderate regurgitation directed   eccentrically in the LVOT and towards the mitral anterior   leaflet. - Left atrium: The atrium was mildly dilated.   ASSESSMENT & PLAN:  Adrian Franco is a 71 y.o. 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  He will need to be on Xarelto for at least 3-6 months. 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 Donzetta Matters called it unprovoked. I discussed with him staying on xarelto for life to prevent post phlebitic syndrome and be able to take a possibly pro thrombotic testosterone supplement   HTN: BP well controlled on current therapy  HLD: continue Zocor  Mild-mod AR: he is worried about this because his father had his aortic valve replaced. 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 Recent reassuring exercise myoview. No further work up at this time.   CKD: creat 1.47. He had stopped taking Meloxicam  Low T: he and wife are worried that he will feel badly off his supplementation. I am not sure if this was the inciting cause of his DVT/PE, but if he does end up feeling badly off the testosterone therapy and his PCP thinks he needs it long term, he may need to continue his oral anticoagulation.    Medication Adjustments/Labs and Tests Ordered: Current medicines are reviewed at length with the patient today.  Concerns regarding medicines are outlined above.  Medication changes, Labs and Tests ordered today are listed in the Patient Instructions below. Patient Instructions  Medication Instructions:  Your physician recommends that you continue on your current medications as directed. Please refer to the Current Medication list given to you today.  Labwork: NONE  Testing/Procedures: Your physician has requested that you have an echocardiogram in one year. Echocardiography is a painless test that uses sound waves to create images of your heart. It provides your doctor with information about the size and shape of your heart and how well your heart's chambers and valves are working. This procedure takes approximately one hour. There are no restrictions for this procedure.  Follow-Up: Your physician wants you to follow-up in: 12 months with [name]. You will receive a reminder letter in the mail two months in advance. If you don't receive a letter, please call our office to schedule the follow-up appointment.   If you need a refill on your cardiac medications before your next appointment, please call your pharmacy.       Signed, Jenkins Rouge, MD  04/17/2017 10:55 AM  [facility] [place], [place], [place]   [address] Phone: [phone]; Fax: [phone]

## 2017-04-17 ENCOUNTER — Ambulatory Visit (HOSPITAL_COMMUNITY): Payer: Medicare Other | Attending: Cardiology

## 2017-04-17 ENCOUNTER — Encounter (INDEPENDENT_AMBULATORY_CARE_PROVIDER_SITE_OTHER): Payer: Self-pay

## 2017-04-17 ENCOUNTER — Other Ambulatory Visit: Payer: Self-pay

## 2017-04-17 ENCOUNTER — Encounter: Payer: Self-pay | Admitting: Cardiovascular Disease

## 2017-04-17 ENCOUNTER — Ambulatory Visit (INDEPENDENT_AMBULATORY_CARE_PROVIDER_SITE_OTHER): Payer: Medicare Other | Admitting: Cardiovascular Disease

## 2017-04-17 VITALS — BP 136/82 | HR 51 | Ht 68.0 in | Wt 200.0 lb

## 2017-04-17 DIAGNOSIS — I119 Hypertensive heart disease without heart failure: Secondary | ICD-10-CM | POA: Diagnosis not present

## 2017-04-17 DIAGNOSIS — E785 Hyperlipidemia, unspecified: Secondary | ICD-10-CM | POA: Insufficient documentation

## 2017-04-17 DIAGNOSIS — I351 Nonrheumatic aortic (valve) insufficiency: Secondary | ICD-10-CM | POA: Diagnosis present

## 2017-04-17 DIAGNOSIS — I10 Essential (primary) hypertension: Secondary | ICD-10-CM

## 2017-04-17 NOTE — Patient Instructions (Signed)

## 2017-04-23 ENCOUNTER — Other Ambulatory Visit (HOSPITAL_COMMUNITY): Payer: Medicare Other

## 2018-04-21 NOTE — Progress Notes (Signed)
Cardiology Office Note    Date:  04/30/2018   ID:  Adrian Franco, DOB 1946/02/11, MRN [id number]  PCP:  [name], MD  Cardiologist: [name]   CC:  Follow up  History of Present Illness:   72 y.o. with history of HLD, HTN, CKD baseline Cr around 1.5. Diagnosed with submassive PE in August of 2017. Seen by Dr Donzetta Matters [facility] and ? If event ppt by testosterone supplements. I follow him for AR which has been  Mild-moderate by last echo done  04/17/17 with normal LV size and function EF 55-60% History of atypical chest pain with normal myovue in August 2017  Had large LLE DVT and moe issues with leg swelling an post phlebitic syndrome Discussion was had about life long anticoagulation since He had residual DVT and not clear if large PE was unprovoked. 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 Atypical chest pain   . BPH (benign prostatic hypertrophy)   . DVT (deep venous thrombosis) ([place])   . GERD (gastroesophageal reflux disease)   . HTN (hypertension)   . Hyperlipemia   . Hypogonadism in male   . Insomnia   . Localized edema   . PE (pulmonary embolism)   . Renal insufficiency   . SOB (shortness of breath)   . Testicular hypofunction     Past Surgical History:  Procedure Laterality Date  . INGUINAL HERNIA REPAIR      Current Medications: Outpatient Medications Prior to Visit  Medication Sig Dispense Refill  . acetaminophen (TYLENOL) 500 MG tablet Take 500-1,000 mg by mouth every 6 (six) hours as needed for mild pain or moderate pain.    Marland Kitchen CALCIUM PO Take 1 tablet by mouth daily.    Marland Kitchen docusate sodium (COLACE) 100 MG capsule Take 100 mg by mouth daily as needed for mild constipation.    . fenofibrate 160 MG  tablet Take 160 mg by mouth daily.    . magnesium oxide (MAG-OX) 400 MG tablet Take 400 mg by mouth every morning.    . Multiple Vitamins-Minerals (ONE-A-DAY MENS 50+ ADVANTAGE) TABS Take 1 tablet by mouth daily with breakfast.    . nitroGLYCERIN (NITROSTAT) 0.4 MG SL tablet Place 0.4 mg under the tongue every 5 (five) minutes as needed. For chest pain    . omeprazole (PRILOSEC) 40 MG capsule Take 40 mg by mouth daily.    . rivaroxaban (XARELTO) 20 MG TABS tablet Take 1 tablet (20 mg total) by mouth daily with supper. 90 tablet 3  . ropinirole (REQUIP) 5 MG tablet Take 5 mg by mouth at bedtime.    . simvastatin (ZOCOR) 20 MG tablet Take 20 mg by mouth daily.    . tamsulosin (FLOMAX) 0.4 MG CAPS capsule Take 0.4 mg by mouth 2 (two) times daily.     Marland Kitchen zolpidem (AMBIEN) 10 MG tablet Take 10 mg by mouth at bedtime as needed for sleep.    Marland Kitchen aluminum-magnesium hydroxide-simethicone (MAALOX) 295-284-13 MG/5ML SUSP Take 15-30 mLs by mouth at bedtime as needed (for indigestion/heartburn).     . losartan (COZAAR) 100 MG tablet Take 100 mg by mouth daily.    . traMADol (ULTRAM) 50 MG tablet Take  50 mg by mouth every 8 (eight) hours as needed for moderate pain.      No facility-administered medications prior to visit.      Allergies:   Sulfa antibiotics; Amoxapine and related; and Lotensin [benazepril hcl]   Social History   Socioeconomic History  . Marital status: Married    Spouse name: Not on file  . Number of children: Not on file  . Years of education: Not on file  . Highest education level: Not on file  Occupational History  . Not on file  Social Needs  . Financial resource strain: Not on file  . Food insecurity:    Worry: Not on file    Inability: Not on file  . Transportation needs:    Medical: Not on file    Non-medical: Not on file  Tobacco Use  . Smoking status: Never Smoker  . Smokeless tobacco: Never Used  Substance and Sexual Activity  . Alcohol use: No    Alcohol/week:  0.0 standard drinks  . Drug use: No  . Sexual activity: Yes    Comment: married  Lifestyle  . Physical activity:    Days per week: Not on file    Minutes per session: Not on file  . Stress: Not on file  Relationships  . Social connections:    Talks on phone: Not on file    Gets together: Not on file    Attends religious service: Not on file    Active member of club or organization: Not on file    Attends meetings of clubs or organizations: Not on file    Relationship status: Not on file  Other Topics Concern  . Not on file  Social History Narrative  . Not on file     Family History:  The patient'sfamily history includes Healthy in his brother, sister, son, and son; Heart attack in his father; Hyperlipidemia in his father and mother; Other in his brother.     ROS:   Please see the history of present illness.    ROS All other systems reviewed and are negative.   PHYSICAL EXAM:   VS:  BP (!) 156/66   Pulse (!) 56   Ht 5\' 8"  (1.727 m)   Wt 201 lb (91.2 kg)   BMI 30.56 kg/m    Affect appropriate Healthy:  appears stated age 14: normal Neck supple with no adenopathy JVP normal no bruits no thyromegaly Lungs clear with no wheezing and good diaphragmatic motion Heart:  S1/S2 no murmur, no rub, gallop or click PMI normal Abdomen: benighn, BS positve, no tenderness, no AAA no bruit.  No HSM or HJR Distal pulses intact with no bruits Plus one bilateral  Edema with varicosities  Neuro non-focal Skin warm and dry No muscular weakness   Wt Readings from Last 3 Encounters:  04/29/18 201 lb (91.2 kg)  04/17/17 200 lb (90.7 kg)  12/14/16 200 lb (90.7 kg)      Studies/Labs Reviewed:   EKG:  SR rate 51 PR 238 normal otherwise   Recent Labs: No results found for requested labs within last 8760 hours.   Lipid Panel No results found for: CHOL, TRIG, HDL, CHOLHDL, VLDL, LDLCALC, LDLDIRECT  Additional studies/ records that were reviewed today include:   04/05/16 Study Highlights    Nuclear stress EF: 55%.  Blood pressure demonstrated a hypertensive response to exercise.  There was no ST segment deviation noted during stress.  No T wave inversion was noted during stress.  The study is normal.  This is a low risk study.  The left ventricular ejection fraction is normal (55-65%).     2D ECHO: 04/05/2016 LV EF: 55% -   60% Study Conclusions - Left ventricle: The cavity size was normal. Wall thickness was   normal. Systolic function was normal. The estimated ejection   fraction was in the range of 55% to 60%. Wall motion was normal;   there were no regional wall motion abnormalities. Left   ventricular diastolic function parameters were normal. - Aortic valve: There was mild to moderate regurgitation directed   eccentrically in the LVOT and towards the mitral anterior   leaflet. - Left atrium: The atrium was mildly dilated.   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  He will need to be on Xarelto for at least 3-6 months. 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 Donzetta Matters called it unprovoked. I discussed with him staying on xarelto for life to prevent post phlebitic syndrome and be able to take a possibly pro thrombotic testosterone supplement   HTN: BP well controlled on current therapy  HLD: continue Zocor  Mild-mod AR: he is worried about this because his father had his aortic valve replaced. 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 Recent reassuring exercise myoview. No further work up at this time.   CKD: check BMET starting diuretic   Low T: he and wife are worried that he will feel badly off his supplementation. I am not sure if this was the inciting cause of his  DVT/PE, but if he does end up feeling badly off the testosterone therapy and his PCP thinks he needs it long term, he may need to continue his oral anticoagulation.  Aortic Aneurysm :   F/u CTA to size root will get labs from primary done 04/17/18 to check Cr and K   Medication Adjustments/Labs and Tests Ordered: Current medicines are reviewed at length with the patient today.  Concerns regarding medicines are outlined above.  Medication changes, Labs and Tests ordered today are listed in the Patient Instructions below. Patient Instructions  Medication Instructions:  Your physician has recommended you make the following change in your medication:  1-STOP Losartan after your CT scan 2-START Losartan/HCTZ 100/25 mg by mouth daily after your CT scan   Labwork: Your physician recommends that you have lab work done in 4 weeks after starting your new medication. Please have your primary care doctor fax results to [phone]. Please have most recently lab work faxed as well.   Testing/Procedures: Chest CTA scanning, (CAT scanning), is a noninvasive, special x-ray that produces cross-sectional images of the body using x-rays and a computer. CT scans help physicians diagnose and treat medical conditions. For some CT exams, a contrast material is used to enhance visibility in the area of the body being studied. CT scans provide greater clarity and reveal more details than regular x-ray exams.  Follow-Up: Your physician wants you to follow-up in: 3 months with [name].   If you need a refill on your cardiac medications before your next appointment, please call your pharmacy.      Signed, Jenkins Rouge, MD  04/30/2018 8:54 AM    [facility] [id number]  [address] [place], [place]  [address] Phone: [phone]; Fax: [phone]

## 2018-04-23 ENCOUNTER — Other Ambulatory Visit (HOSPITAL_COMMUNITY): Payer: Medicare Other

## 2018-04-25 ENCOUNTER — Ambulatory Visit (HOSPITAL_COMMUNITY): Payer: Medicare Other | Attending: Cardiovascular Disease

## 2018-04-25 ENCOUNTER — Other Ambulatory Visit: Payer: Self-pay

## 2018-04-25 DIAGNOSIS — Z86711 Personal history of pulmonary embolism: Secondary | ICD-10-CM | POA: Insufficient documentation

## 2018-04-25 DIAGNOSIS — R0600 Dyspnea, unspecified: Secondary | ICD-10-CM | POA: Insufficient documentation

## 2018-04-25 DIAGNOSIS — I119 Hypertensive heart disease without heart failure: Secondary | ICD-10-CM | POA: Diagnosis not present

## 2018-04-25 DIAGNOSIS — Z86718 Personal history of other venous thrombosis and embolism: Secondary | ICD-10-CM | POA: Diagnosis not present

## 2018-04-25 DIAGNOSIS — I351 Nonrheumatic aortic (valve) insufficiency: Secondary | ICD-10-CM | POA: Diagnosis not present

## 2018-04-29 ENCOUNTER — Ambulatory Visit (INDEPENDENT_AMBULATORY_CARE_PROVIDER_SITE_OTHER): Payer: Medicare Other | Admitting: Cardiovascular Disease

## 2018-04-29 VITALS — BP 156/66 | HR 56 | Ht 68.0 in | Wt 201.0 lb

## 2018-04-29 DIAGNOSIS — Z86718 Personal history of other venous thrombosis and embolism: Secondary | ICD-10-CM

## 2018-04-29 DIAGNOSIS — I10 Essential (primary) hypertension: Secondary | ICD-10-CM | POA: Diagnosis not present

## 2018-04-29 DIAGNOSIS — R0789 Other chest pain: Secondary | ICD-10-CM

## 2018-04-29 DIAGNOSIS — I351 Nonrheumatic aortic (valve) insufficiency: Secondary | ICD-10-CM

## 2018-04-29 MED ORDER — LOSARTAN POTASSIUM-HCTZ 100-25 MG PO TABS: 1.0000 | ORAL_TABLET | Freq: Every day | ORAL | 3 refills | Status: DC

## 2018-04-29 NOTE — Patient Instructions (Addendum)
Medication Instructions:  Your physician has recommended you make the following change in your medication:  1-STOP Losartan after your CT scan 2-START Losartan/HCTZ 100/25 mg by mouth daily after your CT scan   Labwork: Your physician recommends that you have lab work done in 4 weeks after starting your new medication. Please have your primary care doctor fax results to [phone]. Please have most recently lab work faxed as well.   Testing/Procedures: Chest CTA scanning, (CAT scanning), is a noninvasive, special x-ray that produces cross-sectional images of the body using x-rays and a computer. CT scans help physicians diagnose and treat medical conditions. For some CT exams, a contrast material is used to enhance visibility in the area of the body being studied. CT scans provide greater clarity and reveal more details than regular x-ray exams.  Follow-Up: Your physician wants you to follow-up in: 3 months with [name].   If you need a refill on your cardiac medications before your next appointment, please call your pharmacy.

## 2018-05-01 NOTE — Addendum Note (Signed)
Addended by: Claude Manges on: 05/01/2018 10:20 AM   Modules accepted: Orders

## 2018-05-06 ENCOUNTER — Telehealth: Payer: Self-pay

## 2018-05-06 DIAGNOSIS — Z79899 Other long term (current) drug therapy: Secondary | ICD-10-CM

## 2018-05-06 DIAGNOSIS — I10 Essential (primary) hypertension: Secondary | ICD-10-CM

## 2018-05-06 NOTE — Telephone Encounter (Signed)
-----   Message from Josue Hector, MD sent at 04/30/2018  5:26 PM EDT ----- CR 1.5 should be ok for CTA f/u BMET 4 weeks after being on diuretic

## 2018-05-06 NOTE — Telephone Encounter (Signed)
Order placed for BMET in October.

## 2018-05-15 ENCOUNTER — Ambulatory Visit (INDEPENDENT_AMBULATORY_CARE_PROVIDER_SITE_OTHER)
Admission: RE | Admit: 2018-05-15 | Discharge: 2018-05-15 | Disposition: A | Discharge status: Home / Self Care | Payer: Medicare Other | Source: Ambulatory Visit | Attending: Cardiovascular Disease | Admitting: Cardiovascular Disease

## 2018-05-15 DIAGNOSIS — I351 Nonrheumatic aortic (valve) insufficiency: Secondary | ICD-10-CM

## 2018-05-15 MED ORDER — IOPAMIDOL (ISOVUE-370) INJECTION 76%
100.0000 mL | Freq: Once | INTRAVENOUS | Status: AC | PRN
  Administered 2018-05-15: 100 mL via INTRAVENOUS

## 2018-05-16 ENCOUNTER — Telehealth: Payer: Self-pay

## 2018-05-16 DIAGNOSIS — Z79899 Other long term (current) drug therapy: Secondary | ICD-10-CM

## 2018-05-16 DIAGNOSIS — I712 Thoracic aortic aneurysm, without rupture, unspecified: Secondary | ICD-10-CM

## 2018-05-16 DIAGNOSIS — I10 Essential (primary) hypertension: Secondary | ICD-10-CM

## 2018-05-16 NOTE — Telephone Encounter (Signed)
-----   Message from Josue Hector, MD sent at 05/15/2018  3:45 PM EDT ----- Aorta is only 4.1 cm f/u non contrast MRI in a year stable

## 2018-05-16 NOTE — Telephone Encounter (Signed)
Patient aware of results. Per [name], Aorta is only 4.1 cm f/u non contrast MRI in a year stable. Patient will start Losartan/ HCTZ now and get a BMET in 4 weeks at [facility].

## 2018-05-26 ENCOUNTER — Telehealth: Payer: Self-pay | Admitting: Cardiovascular Disease

## 2018-05-26 NOTE — Telephone Encounter (Signed)
Pt c/o medication issue: 1. Name of Medication: losartan-hydrochlorothiazide (HYZAAR) 100-25 MG tablet  2. How are you currently taking this medication (dosage and times per day)?  Take 1 tablet by mouth daily.  3. Are you having a reaction (difficulty breathing--STAT)?  No  4. What is your medication issue?  Patient's wife states they received a phone call from the pharmacist stating that this medication has been recalled. Patient wants to know what to do now.

## 2018-05-27 ENCOUNTER — Other Ambulatory Visit: Payer: Self-pay | Admitting: Pharmacist

## 2018-05-27 MED ORDER — IRBESARTAN 300 MG PO TABS: 300.0000 mg | ORAL_TABLET | Freq: Every day | ORAL | 11 refills | Status: DC

## 2018-05-27 MED ORDER — HYDROCHLOROTHIAZIDE 25 MG PO TABS: 25.0000 mg | ORAL_TABLET | Freq: Every day | ORAL | 11 refills | Status: DC

## 2018-05-27 NOTE — Telephone Encounter (Signed)
Patient's wife called back. Informed her of our pharmacist recommendation. Patient's wife stated patient can try this medication and if it works well for the patient, she will call back to get a 90 day supply sent to mail order pharmacy. Patient's wife stated patient will monitor his BP and they will call if it's too low.

## 2018-05-27 NOTE — Telephone Encounter (Signed)
If pharmacy called pt to let him know his supply of losartan was affected, will likely need to switch to different ARB and split med into 2 separate components since there are no equivalent combinations of full dose ARB and HCTZ. Recommend irbesartan 300mg  daily and HCTZ 25mg  daily. Advise pt to monitor BP as irbesartan is slightly more effective than losartan at lowering BP.

## 2018-05-27 NOTE — Telephone Encounter (Signed)
Left message for patient to call back  

## 2018-06-23 ENCOUNTER — Telehealth: Payer: Self-pay | Admitting: Cardiovascular Disease

## 2018-06-23 MED ORDER — IRBESARTAN 300 MG PO TABS: ORAL_TABLET | ORAL | 3 refills | Status: DC

## 2018-06-23 MED ORDER — HYDROCHLOROTHIAZIDE 25 MG PO TABS
ORAL_TABLET | ORAL | 3 refills | Status: DC
Start: 2018-06-23 — End: 2019-09-16

## 2018-06-23 NOTE — Telephone Encounter (Signed)
New message:       *STAT* If patient is at the pharmacy, call can be transferred to refill team.   1. Which medications need to be refilled? (please list name of each medication and dose if known) hydrochlorothiazide (HYDRODIURIL) 25 MG tablet  irbesartan (AVAPRO) 300 MG tablet  2. Which pharmacy/location (including street and city if local pharmacy) is medication to be sent to?[place], [place]  3. Do they need a 30 day or 90 day supply? [place]

## 2018-06-23 NOTE — Telephone Encounter (Signed)
Pt's medications were sent to pt's pharmacy as requested. Confirmation received.  

## 2018-08-01 NOTE — Progress Notes (Signed)
Cardiology Office Note    Date:  08/04/2018   ID:  Adrian Franco, DOB 1946/04/10, MRN [id number]  PCP:  Adrian Mallet, MD  Cardiologist: [name]   CC:  Follow up  History of Present Illness:   72 y.o. with history of HLD, HTN, CKD baseline Cr around 1.5. Diagnosed with submassive PE in August of 2017. Seen by Dr Donzetta Matters [facility] and ? If event ppt by testosterone supplements. 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 Atypical chest pain   . BPH (benign prostatic hypertrophy)   . DVT (deep venous thrombosis) ([place])   . GERD (gastroesophageal reflux disease)   . HTN (hypertension)   . Hyperlipemia   . Hypogonadism in male   . Insomnia   . Localized edema   . PE (pulmonary embolism)   . Renal insufficiency   . SOB (shortness of breath)   . Testicular hypofunction     Past Surgical History:  Procedure Laterality Date  . INGUINAL HERNIA REPAIR      Current Medications: Outpatient Medications Prior to Visit  Medication Sig Dispense Refill  . acetaminophen (TYLENOL) 500 MG tablet Take 500-1,000 mg by mouth every 6 (six) hours as needed for mild pain or moderate pain.    Marland Kitchen CALCIUM PO Take 1 tablet by mouth daily.    Marland Kitchen docusate sodium (COLACE) 100 MG capsule Take 100 mg by mouth daily as needed for mild constipation.    . fenofibrate (TRICOR) 145 MG tablet Take 145 mg by mouth daily.     . hydrochlorothiazide (HYDRODIURIL) 25 MG tablet TAKE 1 TABLET(25 MG) BY MOUTH DAILY 90 tablet 3  . irbesartan (AVAPRO) 300 MG tablet TAKE 1 TABLET(300 MG) BY MOUTH DAILY 90 tablet 3  . magnesium oxide (MAG-OX) 400 MG tablet Take 400 mg by mouth every morning.    . Multiple Vitamins-Minerals (ONE-A-DAY MENS 50+ ADVANTAGE) TABS Take 1 tablet by mouth daily with breakfast.    . nitroGLYCERIN (NITROSTAT) 0.4 MG SL tablet Place 0.4 mg under the tongue every 5 (five) minutes as needed. For chest pain    . omeprazole (PRILOSEC) 40 MG capsule Take 40 mg by mouth daily.    . rivaroxaban (XARELTO) 20 MG TABS tablet Take 1 tablet (20 mg total) by mouth daily with supper. 90 tablet 3  . ropinirole (REQUIP) 5 MG tablet Take 5 mg by mouth at bedtime.    . simvastatin (ZOCOR) 20 MG tablet Take 20 mg by mouth daily.    . tamsulosin (FLOMAX) 0.4 MG CAPS capsule Take 0.4 mg by mouth 2 (two) times daily.     . Testosterone Cypionate 200 MG/ML SOLN Inject as directed. Injection every two weeks    . zolpidem (AMBIEN) 10 MG tablet Take 10 mg by mouth at bedtime as needed for sleep.    Marland Kitchen  fenofibrate 160 MG tablet Take 160 mg by mouth daily.     No facility-administered medications prior to visit.      Allergies:   Sulfa antibiotics; Amoxapine and related; and Lotensin [benazepril hcl]   Social History   Socioeconomic History  . Marital status: Married    Spouse name: Not on file  . Number of children: Not on file  . Years of education: Not on file  . Highest education level: Not on file  Occupational History  . Not on file  Social Needs  . Financial resource strain: Not on file  . Food insecurity:    Worry: Not on file    Inability: Not on file  . Transportation needs:    Medical: Not on file    Non-medical: Not on file  Tobacco Use  . Smoking status: Never Smoker  . Smokeless tobacco: Never Used  Substance and Sexual Activity  . Alcohol use: No    Alcohol/week: 0.0 standard drinks  . Drug  use: No  . Sexual activity: Yes    Comment: married  Lifestyle  . Physical activity:    Days per week: Not on file    Minutes per session: Not on file  . Stress: Not on file  Relationships  . Social connections:    Talks on phone: Not on file    Gets together: Not on file    Attends religious service: Not on file    Active member of club or organization: Not on file    Attends meetings of clubs or organizations: Not on file    Relationship status: Not on file  Other Topics Concern  . Not on file  Social History Narrative  . Not on file     Family History:  The patient'sfamily history includes Healthy in his brother, sister, son, and son; Heart attack in his father; Hyperlipidemia in his father and mother; Other in his brother.     ROS:   Please see the history of present illness.    ROS All other systems reviewed and are negative.   PHYSICAL EXAM:   VS:  BP 112/74   Pulse 73   Ht 5\' 8"  (1.727 m)   Wt 201 lb 4 oz (91.3 kg)   SpO2 97%   BMI 30.60 kg/m     Affect appropriate Healthy:  appears stated age 30: normal Neck supple with no adenopathy JVP normal no bruits no thyromegaly Lungs clear with no wheezing and good diaphragmatic motion Heart:  S1/S2 no murmur, no rub, gallop or click PMI normal Abdomen: benighn, BS positve, no tenderness, no AAA no bruit.  No HSM or HJR Distal pulses intact with no bruits Neuro non-focal Skin warm and dry No muscular weakness Plus one bilateral  Edema with varicosities    Wt Readings from Last 3 Encounters:  08/04/18 201 lb 4 oz (91.3 kg)  04/29/18 201 lb (91.2 kg)  04/17/17 200 lb (90.7 kg)      Studies/Labs Reviewed:   EKG:  SR rate 51 PR 238 normal otherwise   Recent Labs: No results found for requested labs within last 8760 hours.   Lipid Panel No results found for: CHOL, TRIG, HDL, CHOLHDL, VLDL, LDLCALC, LDLDIRECT  Additional studies/ records that were reviewed today include:  04/05/16 Study Highlights      Nuclear stress EF: 55%.  Blood pressure demonstrated a hypertensive response to exercise.  There was no ST segment deviation noted during stress.  No T wave inversion was  noted during stress.  The study is normal.  This is a low risk study.  The left ventricular ejection fraction is normal (55-65%).     2D ECHO: 04/05/2016 LV EF: 55% -   60% Study Conclusions - Left ventricle: The cavity size was normal. Wall thickness was   normal. Systolic function was normal. The estimated ejection   fraction was in the range of 55% to 60%. Wall motion was normal;   there were no regional wall motion abnormalities. Left   ventricular diastolic function parameters were normal. - Aortic valve: There was mild to moderate regurgitation directed   eccentrically in the LVOT and towards the mitral anterior   leaflet. - Left atrium: The atrium was mildly dilated.   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  He will need to be on Xarelto for at least 3-6 months. 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 Donzetta Matters called it unprovoked. I discussed with him staying on xarelto for life to prevent post phlebitic syndrome and be able to take a possibly pro thrombotic testosterone supplement   HTN: BP well controlled on current therapy shortage of losartan prompted change to avapro   HLD: continue Zocor  Mild-mod AR: he is worried about this because his father had his aortic valve replaced. 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 Recent reassuring exercise myoview 2017 . No further work up at this time.   CKD: Cr stable on diuretic about 1.5    Low T: he and wife are worried that he will feel badly off his supplementation. I am not sure if this  was the inciting cause of his DVT/PE, but if he does end up feeling badly off the testosterone therapy and his PCP thinks he needs it long term, he may need to continue his oral anticoagulation.  Aortic Aneurysm :  4.1 cm ascending by CT 05/15/18 consider f/u September 2020    Medication Adjustments/Labs and Tests Ordered: Current medicines are reviewed at length with the patient today.  Concerns regarding medicines are outlined above.  Medication changes, Labs and Tests ordered today are listed in the Patient Instructions below. Patient Instructions  Medication Instructions:   If you need a refill on your cardiac medications before your next appointment, please call your pharmacy.     Lab work:  If you have labs (blood work) drawn today and your tests are completely normal, you will receive your results only by: Marland Kitchen MyChart Message (if you have MyChart) OR . A paper copy in the mail If you have any lab test that is abnormal or we need to change your treatment, we will call you to review the results.    Testing/Procedures:     Follow-Up: At [facility], you and your health needs are our priority.  As part of our continuing mission to provide you with exceptional heart care, we have created designated Provider Care Teams.  These Care Teams include your primary Cardiologist (physician) and Advanced Practice Providers (APPs -  Physician Assistants and Nurse Practitioners) who all work together to provide you with the care you need, when you need it. You will need a follow up appointment in 12 months.  Please call our office 2 months in  advance to schedule this appointment.  You may see Jenkins Rouge, MD or one of the following Advanced Practice Providers on your designated Care Team:   Truitt Merle, NP Cecilie Kicks, NP . Kathyrn Drown, NP    Any Other Special Instructions Will Be Listed Below (If Applicable).       Signed, Jenkins Rouge, MD  08/04/2018 10:45 AM    [address] , [place], [place]  [address] Phone: [phone]; Fax: [phone]

## 2018-08-04 ENCOUNTER — Ambulatory Visit (INDEPENDENT_AMBULATORY_CARE_PROVIDER_SITE_OTHER): Payer: Medicare Other | Admitting: Cardiovascular Disease

## 2018-08-04 ENCOUNTER — Encounter: Payer: Self-pay | Admitting: Cardiovascular Disease

## 2018-08-04 VITALS — BP 112/74 | HR 73 | Ht 68.0 in | Wt 201.2 lb

## 2018-08-04 DIAGNOSIS — I10 Essential (primary) hypertension: Secondary | ICD-10-CM

## 2018-08-04 NOTE — Patient Instructions (Addendum)
Medication Instructions:   If you need a refill on your cardiac medications before your next appointment, please call your pharmacy.     Lab work:  If you have labs (blood work) drawn today and your tests are completely normal, you will receive your results only by: Marland Kitchen MyChart Message (if you have MyChart) OR . A paper copy in the mail If you have any lab test that is abnormal or we need to change your treatment, we will call you to review the results.    Testing/Procedures:     Follow-Up: At [facility], you and your health needs are our priority.  As part of our continuing mission to provide you with exceptional heart care, we have created designated Provider Care Teams.  These Care Teams include your primary Cardiologist (physician) and Advanced Practice Providers (APPs -  Physician Assistants and Nurse Practitioners) who all work together to provide you with the care you need, when you need it. You will need a follow up appointment in 12 months.  Please call our office 2 months in advance to schedule this appointment.  You may see Jenkins Rouge, MD or one of the following Advanced Practice Providers on your designated Care Team:   Truitt Merle, NP Cecilie Kicks, NP . Kathyrn Drown, NP    Any Other Special Instructions Will Be Listed Below (If Applicable).

## 2019-05-25 ENCOUNTER — Other Ambulatory Visit: Payer: Self-pay

## 2019-05-25 DIAGNOSIS — Z01812 Encounter for preprocedural laboratory examination: Secondary | ICD-10-CM

## 2019-05-25 DIAGNOSIS — I351 Nonrheumatic aortic (valve) insufficiency: Secondary | ICD-10-CM

## 2019-06-10 ENCOUNTER — Ambulatory Visit (HOSPITAL_COMMUNITY)
Admission: RE | Admit: 2019-06-10 | Discharge: 2019-06-10 | Disposition: A | Discharge status: Home / Self Care | Payer: Medicare Other | Source: Ambulatory Visit | Attending: Cardiovascular Disease | Admitting: Cardiovascular Disease

## 2019-06-10 ENCOUNTER — Other Ambulatory Visit: Payer: Self-pay

## 2019-06-10 DIAGNOSIS — I712 Thoracic aortic aneurysm, without rupture, unspecified: Secondary | ICD-10-CM

## 2019-06-10 LAB — CREATININE, SERUM
Creatinine, Ser: 1.29 mg/dL — ABNORMAL HIGH (ref 0.61–1.24)
GFR calc Af Amer: >60 mL/min (ref >60)
GFR calc non Af Amer: 55 mL/min — ABNORMAL LOW (ref >60)

## 2019-06-10 MED ORDER — GADOBUTROL 1 MMOL/ML IV SOLN
10.0000 mL | Freq: Once | INTRAVENOUS | Status: AC | PRN
  Administered 2019-06-10: 10 mL via INTRAVENOUS

## 2019-06-11 ENCOUNTER — Telehealth: Payer: Self-pay

## 2019-06-11 DIAGNOSIS — I712 Thoracic aortic aneurysm, without rupture, unspecified: Secondary | ICD-10-CM

## 2019-06-11 NOTE — Telephone Encounter (Signed)
Placed order for MRI/MRA for one year.

## 2019-06-11 NOTE — Telephone Encounter (Signed)
-----   Message from Josue Hector, MD sent at 06/11/2019  9:20 AM EDT ----- Stable aorta 4.1 cm f/u MRI/MRA in a year

## 2019-06-18 ENCOUNTER — Ambulatory Visit: Payer: Medicare Other | Admitting: Cardiovascular Disease

## 2019-06-28 DEATH — deceased

## 2019-07-31 ENCOUNTER — Ambulatory Visit: Payer: Medicare Other | Admitting: Cardiovascular Disease

## 2019-08-13 ENCOUNTER — Institutional Professional Consult (permissible substitution): Payer: Self-pay | Admitting: Neurology

## 2019-09-16 ENCOUNTER — Other Ambulatory Visit: Payer: Self-pay

## 2019-09-16 ENCOUNTER — Encounter: Payer: Self-pay | Admitting: Neurology

## 2019-09-16 ENCOUNTER — Ambulatory Visit (INDEPENDENT_AMBULATORY_CARE_PROVIDER_SITE_OTHER): Payer: Medicare Other | Admitting: Neurology

## 2019-09-16 VITALS — BP 122/78 | HR 71 | Temp 97.9°F | Ht 69.0 in | Wt 194.0 lb

## 2019-09-16 DIAGNOSIS — G4733 Obstructive sleep apnea (adult) (pediatric): Secondary | ICD-10-CM | POA: Diagnosis not present

## 2019-09-16 DIAGNOSIS — Z789 Other specified health status: Secondary | ICD-10-CM

## 2019-09-16 DIAGNOSIS — Z9189 Other specified personal risk factors, not elsewhere classified: Secondary | ICD-10-CM

## 2019-09-16 DIAGNOSIS — G250 Essential tremor: Secondary | ICD-10-CM

## 2019-09-16 DIAGNOSIS — I10 Essential (primary) hypertension: Secondary | ICD-10-CM

## 2019-09-16 DIAGNOSIS — Z9989 Dependence on other enabling machines and devices: Secondary | ICD-10-CM

## 2019-09-16 NOTE — Progress Notes (Signed)
SLEEP MEDICINE CLINIC    Provider:  Larey Seat, MD  Primary Care Physician:  [name], MD [place] [id number]     Referring Provider: Dr. Redmond Baseman, MD ENT- [phone]           Chief Complaint according to patient   Patient presents with:    . New Patient (Initial Visit)           HISTORY OF PRESENT ILLNESS:  Adrian Franco is a 74  year old Caucasian male patient seen here  In the company of his wife. He is a Forensic psychologist. Seen upon referral on 09/16/2019 from dr Redmond Baseman, MD .  Chief concern according to patient :   I have the pleasure of seeing Adrian Franco today, a right -handed White or Caucasian male with a possible sleep disorder.  he   has a past medical history of Atypical chest pain, BPH (benign prostatic hypertrophy), DVT (deep venous thrombosis) ([place]), GERD (gastroesophageal reflux disease), HTN (hypertension), Hyperlipemia, Hypogonadism in male, Insomnia, Localized edema, PE (pulmonary embolism), Renal insufficiency, SOB (shortness of breath), and Testicular hypofunction.. He had been infected with COVID 19 in November , and recovered after 10 days of severe illness, his sense of taste and smell have returned.    The patient had the first sleep study in the year 2005, and was diagnosed with apnea.  He has been using an auto titration CPAP machine and believes the average percentile is about 10 or 11 cm pressure.  He would like to be evaluated for alternatives to CPAP use and is interested in the inspire technology.  He presented to Dr. Hessie Knows on 7 December and is described as a 74 year old male patient with a chief complaint of CPAP use for sleep apnea for the last 10 to 12 years he has noticed a benefit when using CPAP but he has never felt that the mask is fitting him well.  He has been on a full facemask which allows more air leakage and the machine wakes him up from air leaking and he feels at times also smothered. Tried nasal mask  and pillows, was snoring loudly.  His machine makes a puffing sounds.  If he can sleep with CPAP, he feels good the next day.   He didn't bring his CPAP nor his wife's to this appointment.   Sleep relevant medical history: Nocturia/: 2-3 , snoring. No Tonsillectomy.    Social history:  The patient is full-time gainfully employed as a Nurse, learning disability, he is very, very busy currently. Marland Kitchen He lives in a household with his wife, the couple has adult children.  The patient currently works into the night.  Tobacco use; never .  ETOH use ; rare , Caffeine intake in form of Coffee( 1 cup ) Soda( none ) Tea ( none ) or energy drinks.    Sleep habits are as follows: The patient's dinner time is between *7 PM. The patient goes to bed at 10-11 PM and continues to sleep for 2-3  Hours en bloc, wakes for 2-3 bathroom breaks, the first time at 1 AM.   The preferred sleep position is supine due to hip pain, with the support of 1 pillow. Dreams are reportedly rare/ . He can raise the headrest of the bed- 14 degrees.   6.30 AM is the usual rise time. The patient wakes up spontaneously at 5.30.  He reports not feeling refreshed or restored in AM unless  having used CPAP , with symptoms such as dry mouth and residual fatigue. Naps are taken infrequently, but he dozes off.    Review of Systems: Out of a complete 14 system review, the patient complains of only the following symptoms, and all other reviewed systems are negative.:  Fatigue, sleepiness , snoring, fragmented sleep, nocturia - 2 times    How likely are you to doze in the following situations: 0 = not likely, 1 = slight chance, 2 = moderate chance, 3 = high chance   Sitting and Reading? Watching Television? Sitting inactive in a public place (theater or meeting)? As a passenger in a car for an hour without a break? Lying down in the afternoon when circumstances permit? Sitting and talking to someone? Sitting quietly after lunch without alcohol? In  a car, while stopped for a few minutes in traffic?   Total = 7/ 24 points   FSS endorsed at 34/ 63 points.   Social History   Socioeconomic History  . Marital status: Married    Spouse name: Not on file  . Number of children: Not on file  . Years of education: Not on file  . Highest education level: Not on file  Occupational History  . Not on file  Tobacco Use  . Smoking status: Never Smoker  . Smokeless tobacco: Never Used  Substance and Sexual Activity  . Alcohol use: No    Alcohol/week: 0.0 standard drinks  . Drug use: No  . Sexual activity: Yes    Comment: married  Other Topics Concern  . Not on file  Social History Narrative  . Not on file   Social Determinants of Health   Financial Resource Strain:   . Difficulty of Paying Living Expenses: Not on file  Food Insecurity:   . Worried About Charity fundraiser in the Last Year: Not on file  . Ran Out of Food in the Last Year: Not on file  Transportation Needs:   . Lack of Transportation (Medical): Not on file  . Lack of Transportation (Non-Medical): Not on file  Physical Activity:   . Days of Exercise per Week: Not on file  . Minutes of Exercise per Session: Not on file  Stress:   . Feeling of Stress : Not on file  Social Connections:   . Frequency of Communication with Friends and Family: Not on file  . Frequency of Social Gatherings with Friends and Family: Not on file  . Attends Religious Services: Not on file  . Active Member of Clubs or Organizations: Not on file  . Attends Archivist Meetings: Not on file  . Marital Status: Not on file    Family History  Problem Relation Age of Onset  . Hyperlipidemia Mother   . Hyperlipidemia Father   . Heart attack Father   . Healthy Son   . Healthy Brother   . Other Brother   . Healthy Sister   . Healthy Son     Past Medical History:  Diagnosis Date  . Atypical chest pain   . BPH (benign prostatic hypertrophy)   . DVT (deep venous thrombosis)  ([place])   . GERD (gastroesophageal reflux disease)   . HTN (hypertension)   . Hyperlipemia   . Hypogonadism in male   . Insomnia   . Localized edema   . PE (pulmonary embolism)   . Renal insufficiency   . SOB (shortness of breath)   . Testicular hypofunction     Past Surgical  History:  Procedure Laterality Date  . INGUINAL HERNIA REPAIR       Current Outpatient Medications on File Prior to Visit  Medication Sig Dispense Refill  . acetaminophen (TYLENOL) 500 MG tablet Take 500-1,000 mg by mouth every 6 (six) hours as needed for mild pain or moderate pain.    . Ascorbic Acid (VITAMIN C PO) Take 1 tablet by mouth daily.    . B Complex Vitamins (VITAMIN B COMPLEX PO) Take 1 tablet by mouth daily.    Marland Kitchen CALCIUM PO Take 1 tablet by mouth daily.    Marland Kitchen docusate sodium (COLACE) 100 MG capsule Take 100 mg by mouth daily as needed for mild constipation.    . fenofibrate (TRICOR) 145 MG tablet Take 145 mg by mouth daily.    . irbesartan (AVAPRO) 300 MG tablet TAKE 1 TABLET(300 MG) BY MOUTH DAILY 90 tablet 3  . magnesium oxide (MAG-OX) 400 MG tablet Take 400 mg by mouth every morning.    . Multiple Vitamins-Minerals (ONE-A-DAY MENS 50+ ADVANTAGE) TABS Take 1 tablet by mouth daily with breakfast.    . nitroGLYCERIN (NITROSTAT) 0.4 MG SL tablet Place 0.4 mg under the tongue every 5 (five) minutes as needed. For chest pain    . omeprazole (PRILOSEC) 40 MG capsule Take 40 mg by mouth daily.    . rivaroxaban (XARELTO) 20 MG TABS tablet Take 1 tablet (20 mg total) by mouth daily with supper. 90 tablet 3  . rOPINIRole (REQUIP) 2 MG tablet Take 2 mg by mouth at bedtime.     . sertraline (ZOLOFT) 100 MG tablet Take 100 mg by mouth daily.    . simvastatin (ZOCOR) 20 MG tablet Take 20 mg by mouth daily.    . tamsulosin (FLOMAX) 0.4 MG CAPS capsule Take 0.4 mg by mouth 2 (two) times daily.     . Testosterone Cypionate 200 MG/ML SOLN Inject as directed. Injection every two weeks    . VITAMIN D PO Take 1  tablet by mouth daily.    Marland Kitchen zolpidem (AMBIEN) 10 MG tablet Take 10 mg by mouth at bedtime as needed for sleep.     No current facility-administered medications on file prior to visit.    Allergies  Allergen Reactions  . Sulfa Antibiotics Itching  . Amoxapine And Related Hives and Rash  . Lotensin [Benazepril Hcl] Hives and Rash    Physical exam:  Today's Vitals   09/16/19 1113  BP: 122/78  Pulse: 71  Temp: 97.9 F (36.6 C)  Weight: 194 lb (88 kg)  Height: 5\' 9"  (1.753 m)   Body mass index is 28.65 kg/m.   Wt Readings from Last 3 Encounters:  09/16/19 194 lb (88 kg)  08/04/18 201 lb 4 oz (91.3 kg)  04/29/18 201 lb (91.2 kg)     Ht Readings from Last 3 Encounters:  09/16/19 5\' 9"  (1.753 m)  08/04/18 5\' 8"  (1.727 m)  04/29/18 5\' 8"  (1.727 m)      General: The patient is awake, alert and appears not in acute distress. The patient is well groomed. Head: Normocephalic, atraumatic. Neck is supple. Mallampati 3  neck circumference:17 inches . Nasal airflow congested -   Retrognathia is seen.  Dental status:  Cardiovascular:  Regular rate and cardiac rhythm by pulse,  without distended neck veins. Respiratory: Lungs are clear to auscultation.  Skin:  Without evidence of ankle edema, or rash. Trunk: The patient's posture is erect.   Neurologic exam : The patient is awake and  alert, oriented to place and time.   Memory subjective described as intact.  Attention span & concentration ability appears normal.  Speech is fluent,  without  dysarthria, dysphonia or aphasia.  Mood and affect are appropriate.   Cranial nerves: no loss of smell or taste reported - recovered by December 2020 Pupils are equal and briskly reactive to light. Funduscopic exam deferred.   Extraocular movements in vertical and horizontal planes were intact and without nystagmus. No Diplopia. Visual fields by finger perimetry are intact. Hearing was intact to soft voice and finger rubbing.    Facial  sensation intact to fine touch. Facial motor strength is symmetric and tongue and uvula move midline.  Neck ROM : rotation, tilt and flexion extension were normal for age and shoulder shrug was symmetrical.    Motor exam:  Symmetric bulk, tone and ROM.   Normal tone without cog wheeling, symmetric grip strength .   Sensory:  Fine touch, pinprick and vibration were tested  and  normal.  Proprioception tested in the upper extremities was normal. Coordination: Rapid alternating movements in the fingers/hands were of normal speed.  The Finger-to-nose maneuver was intact without evidence of ataxia, dysmetria , he has bilaterally rtrremors at rest.    Gait and station: Patient could rise unassisted from a seated position, walked without assistive device.  Stance is of normal width/ base and the patient turned with 4 steps.  Toe and heel walk were deferred.  Deep tendon reflexes: in the  upper and lower extremities are symmetric and intact.  Babinski response was deferred.     After spending a total time of 45 minutes face to face and additional time for physical and neurologic examination, review of laboratory studies,  personal review of imaging studies, reports and results of other testing and review of referral information / records as far as provided in visit, I have established the following assessments:  1) I have the pleasure of meeting today with Adrian Franco, who was seen here in the presence of his wife both of them are CPAP users he does not necessarily happy customer.  He has been using CPAP for 10 maybe 12 years but air leakage has never been well controlled and it wakes him up at night.  This is also the reason that he will look into the alternative treatment by inspire technology.  As we have planned for his wife we will invite Mr. Esbenshade for an in lab sleep study to differentiate obstructive sleep apnea from sleep central sleep apnea and to also see if he could be a good  inspire candidate.  He has reported that on those nights where he can tolerate the CPAP well he feels more rested in the morning and this indicates some benefit.  He also has not had the drastic weight loss but his wife had suffered.  I appreciate Dr. Leata Mouse referral and we will keep him posted with the results of the attended sleep study sincerely, Zahki Hoogendoorn MD     I plan to follow up either personally or through our NP within 2-3  month.    Electronically signed by: Larey Seat, MD 09/16/2019 11:55 AM  [facility] Board certified by The [employer] of Sleep Medicine and Diplomate of the [employer] of Sleep Medicine. Board certified In Neurology through the [place], Fellow of the [employer] of Neurology. Medical Director of [facility].

## 2019-09-30 NOTE — Progress Notes (Signed)
Virtual Visit via Video Note   This visit type was conducted due to national recommendations for restrictions regarding the COVID-19 Pandemic (e.g. social distancing) in an effort to limit this patient's exposure and mitigate transmission in our community.  Due to her co-morbid illnesses, this patient is at least at moderate risk for complications without adequate follow up.  This format is felt to be most appropriate for this patient at this time.  All issues noted in this document were discussed and addressed.  A limited physical exam was performed with this format.  Please refer to the patient's chart for her consent to telehealth for [facility].   Patient location : Home Physician location : Office   Cardiology Office Note    Date:  10/08/2019   ID:  Adrian Franco, DOB February 05, 1946, MRN [id number]  PCP:  [name], MD  Cardiologist: [name]   CC:  Follow up  History of Present Illness:   74 y.o. with history of HLD, HTN, CKD baseline Cr around 1.3. Diagnosed with submassive PE in August of 2017. Seen by Dr Donzetta Matters VVS and ? If event ppt by testosterone supplements. 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 Atypical chest pain   . BPH (benign prostatic hypertrophy)   . DVT (deep venous thrombosis) ([place])   . GERD (gastroesophageal reflux disease)   . HTN (hypertension)   . Hyperlipemia   . Hypogonadism in male   . Insomnia   . Localized edema     . PE (pulmonary embolism)   . Renal insufficiency   . SOB (shortness of breath)   . Testicular hypofunction     Past Surgical History:  Procedure Laterality Date  . INGUINAL HERNIA REPAIR      Current Medications: Outpatient Medications Prior to Visit  Medication Sig Dispense Refill  . acetaminophen (TYLENOL) 500 MG tablet Take 500-1,000 mg by mouth every 6 (six) hours as needed for mild pain or moderate pain.    . Ascorbic Acid (VITAMIN C PO) Take 1 tablet by mouth daily.    . B Complex Vitamins (VITAMIN B COMPLEX PO) Take 1 tablet by mouth daily.    Marland Kitchen CALCIUM PO Take 1 tablet by mouth daily.    Marland Kitchen docusate sodium (COLACE) 100 MG capsule Take 100 mg by mouth daily as needed for mild constipation.    . fenofibrate (TRICOR) 145 MG tablet Take 145 mg by mouth daily.    . irbesartan (AVAPRO) 150 MG tablet Take 150 mg by mouth daily.    . magnesium oxide (MAG-OX) 400 MG tablet Take 400 mg by mouth every morning.    . Multiple Vitamins-Minerals (ONE-A-DAY MENS 50+ ADVANTAGE) TABS Take 1 tablet by mouth daily with breakfast.    . nitroGLYCERIN (NITROSTAT) 0.4 MG SL tablet Place 0.4 mg under the tongue every 5 (five) minutes as needed. For chest pain    .  omeprazole (PRILOSEC) 40 MG capsule Take 40 mg by mouth daily.    . rivaroxaban (XARELTO) 20 MG TABS tablet Take 1 tablet (20 mg total) by mouth daily with supper. 90 tablet 3  . rOPINIRole (REQUIP) 2 MG tablet Take 2 mg by mouth at bedtime.     . sertraline (ZOLOFT) 100 MG tablet Take 100 mg by mouth daily.    . simvastatin (ZOCOR) 20 MG tablet Take 20 mg by mouth daily.    . tamsulosin (FLOMAX) 0.4 MG CAPS capsule Take 0.4 mg by mouth 2 (two) times daily.     . Testosterone Cypionate 200 MG/ML SOLN Inject as directed. Injection every two weeks    . tiZANidine (ZANAFLEX) 2 MG tablet Take 1 tablet by mouth daily as needed.    Marland Kitchen VITAMIN D PO Take 1 tablet by mouth daily.    Marland Kitchen zolpidem (AMBIEN) 10 MG tablet Take 10 mg by mouth at bedtime as  needed for sleep.    . irbesartan (AVAPRO) 300 MG tablet TAKE 1 TABLET(300 MG) BY MOUTH DAILY 90 tablet 3   No facility-administered medications prior to visit.     Allergies:   Sulfa antibiotics, Amoxapine and related, and Lotensin [benazepril hcl]   Social History   Socioeconomic History  . Marital status: Married    Spouse name: Not on file  . Number of children: Not on file  . Years of education: Not on file  . Highest education level: Not on file  Occupational History  . Not on file  Tobacco Use  . Smoking status: Never Smoker  . Smokeless tobacco: Never Used  Substance and Sexual Activity  . Alcohol use: No    Alcohol/week: 0.0 standard drinks  . Drug use: No  . Sexual activity: Yes    Comment: married  Other Topics Concern  . Not on file  Social History Narrative  . Not on file   Social Determinants of Health   Financial Resource Strain:   . Difficulty of Paying Living Expenses: Not on file  Food Insecurity:   . Worried About Charity fundraiser in the Last Year: Not on file  . Ran Out of Food in the Last Year: Not on file  Transportation Needs:   . Lack of Transportation (Medical): Not on file  . Lack of Transportation (Non-Medical): Not on file  Physical Activity:   . Days of Exercise per Week: Not on file  . Minutes of Exercise per Session: Not on file  Stress:   . Feeling of Stress : Not on file  Social Connections:   . Frequency of Communication with Friends and Family: Not on file  . Frequency of Social Gatherings with Friends and Family: Not on file  . Attends Religious Services: Not on file  . Active Member of Clubs or Organizations: Not on file  . Attends Archivist Meetings: Not on file  . Marital Status: Not on file     Family History:  The patient'sfamily history includes Healthy in his brother, sister, son, and son; Heart attack in his father; Hyperlipidemia in his father and mother; Other in his brother.     ROS:   Please see  the history of present illness.    ROS All other systems reviewed and are negative.   PHYSICAL EXAM:   VS:  BP 111/73   Pulse 88   Ht 5\' 8"  (1.727 m)   Wt 190 lb (86.2 kg)   BMI 28.89 kg/m  No distress Skin warm and dry No tachypnea No JVP elevation  LE edema    Wt Readings from Last 3 Encounters:  10/08/19 190 lb (86.2 kg)  09/16/19 194 lb (88 kg)  08/04/18 201 lb 4 oz (91.3 kg)      Studies/Labs Reviewed:   EKG:  SR rate 51 PR 238 normal otherwise   Recent Labs: 06/10/2019: Creatinine, Ser 1.29   Lipid Panel No results found for: CHOL, TRIG, HDL, CHOLHDL, VLDL, LDLCALC, LDLDIRECT  Additional studies/ records that were reviewed today include:  04/05/16 Study Highlights    Nuclear stress EF: 55%.  Blood pressure demonstrated a hypertensive response to exercise.  There was no ST segment deviation noted during stress.  No T wave inversion was noted during stress.  The study is normal.  This is a low risk study.  The left ventricular ejection fraction is normal (55-65%).     2D ECHO: 04/05/2016 LV EF: 55% -   60% Study Conclusions - Left ventricle: The cavity size was normal. Wall thickness was   normal. Systolic function was normal. The estimated ejection   fraction was in the range of 55% to 60%. Wall motion was normal;   there were no regional wall motion abnormalities. Left   ventricular diastolic function parameters were normal. - Aortic valve: There was mild to moderate regurgitation directed   eccentrically in the LVOT and towards the mitral anterior   leaflet. - Left atrium: The atrium was mildly dilated.   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 Donzetta Matters called it unprovoked. I discussed with him staying on xarelto for life to prevent post phlebitic syndrome  and be able to take a possibly pro thrombotic testosterone supplement Will repeat LE venous duplex   HTN: BP well controlled on current therapy shortage of losartan prompted change to avapro   HLD: continue Zocorl labs with primary   Mild-mod AR: he is worried about this because his father had his aortic valve replaced. 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 Recent reassuring exercise myoview 2017 see below   CKD: Cr stable on diuretic about 1.3 Hold diuretic prior to CTA and check BMET   Low T: he and wife are worried that he will feel badly off his supplementation. I am not sure if this was the inciting cause of his DVT/PE, but if he does end up feeling badly off the testosterone therapy and his PCP thinks he needs it long term, he may need to continue his oral anticoagulation.  Aortic Aneurysm :  4.1 cm ascending by CT 05/15/18 will order gated cardiac CTA to assess aneurysm and r/o CAD  Hold diuretic prior to CTA check BMET    Time spent reviewing studies , writing notes/orders and direct patient visit 30 minutes   Ordered:  Cardiac CTA/ LE venous duplex and echo for AR   Signed, Jenkins Rouge, MD  10/08/2019 9:09 AM    [facility] [address], [place], [place]  [address] Phone: [phone]; Fax: [phone]

## 2019-10-06 ENCOUNTER — Ambulatory Visit (INDEPENDENT_AMBULATORY_CARE_PROVIDER_SITE_OTHER): Payer: Medicare Other | Admitting: Neurology

## 2019-10-06 DIAGNOSIS — G4733 Obstructive sleep apnea (adult) (pediatric): Secondary | ICD-10-CM

## 2019-10-06 DIAGNOSIS — G250 Essential tremor: Secondary | ICD-10-CM

## 2019-10-06 DIAGNOSIS — Z9189 Other specified personal risk factors, not elsewhere classified: Secondary | ICD-10-CM

## 2019-10-06 DIAGNOSIS — I10 Essential (primary) hypertension: Secondary | ICD-10-CM

## 2019-10-06 DIAGNOSIS — Z789 Other specified health status: Secondary | ICD-10-CM

## 2019-10-08 ENCOUNTER — Telehealth (INDEPENDENT_AMBULATORY_CARE_PROVIDER_SITE_OTHER): Payer: Medicare Other | Admitting: Cardiovascular Disease

## 2019-10-08 VITALS — BP 111/73 | HR 88 | Ht 68.0 in | Wt 190.0 lb

## 2019-10-08 DIAGNOSIS — I712 Thoracic aortic aneurysm, without rupture, unspecified: Secondary | ICD-10-CM

## 2019-10-08 DIAGNOSIS — N184 Chronic kidney disease, stage 4 (severe): Secondary | ICD-10-CM

## 2019-10-08 DIAGNOSIS — R079 Chest pain, unspecified: Secondary | ICD-10-CM | POA: Diagnosis not present

## 2019-10-08 DIAGNOSIS — I351 Nonrheumatic aortic (valve) insufficiency: Secondary | ICD-10-CM

## 2019-10-08 DIAGNOSIS — E785 Hyperlipidemia, unspecified: Secondary | ICD-10-CM

## 2019-10-08 DIAGNOSIS — N189 Chronic kidney disease, unspecified: Secondary | ICD-10-CM

## 2019-10-08 DIAGNOSIS — I10 Essential (primary) hypertension: Secondary | ICD-10-CM

## 2019-10-08 DIAGNOSIS — Z86718 Personal history of other venous thrombosis and embolism: Secondary | ICD-10-CM

## 2019-10-08 NOTE — Patient Instructions (Addendum)
Medication Instructions:   *If you need a refill on your cardiac medications before your next appointment, please call your pharmacy*  Lab Work:  If you have labs (blood work) drawn today and your tests are completely normal, you will receive your results only by: Marland Kitchen MyChart Message (if you have MyChart) OR . A paper copy in the mail If you have any lab test that is abnormal or we need to change your treatment, we will call you to review the results.  Testing/Procedures: Your physician has requested that you have cardiac CT. Cardiac computed tomography (CT) is a painless test that uses an x-ray machine to take clear, detailed pictures of your heart. For further information please visit [email or web address].tn. Please follow instruction sheet as given.  Your physician has requested that you have an echocardiogram. Echocardiography is a painless test that uses sound waves to create images of your heart. It provides your doctor with information about the size and shape of your heart and how well your heart's chambers and valves are working. This procedure takes approximately one hour. There are no restrictions for this procedure.  Your physician has requested that you have a lower extremity venous duplex. This test is an ultrasound of the veins in the legs. It looks at venous blood flow that carries blood from the heart to the legs . Allow one hour for a Lower Venous exam.  There are no restrictions or special instructions.  Follow-Up: At [facility], you and your health needs are our priority.  As part of our continuing mission to provide you with exceptional heart care, we have created designated Provider Care Teams.  These Care Teams include your primary Cardiologist (physician) and Advanced Practice Providers (APPs -  Physician Assistants and Nurse Practitioners) who all work together to provide you with the care you need, when you need it.  Your next appointment:   6 month(s)  The format for  your next appointment:   In Person  Provider:   You may see Jenkins Rouge, MD or one of the following Advanced Practice Providers on your designated Care Team:    Truitt Merle, NP  Cecilie Kicks, NP  Kathyrn Drown, NP  Your cardiac CT will be scheduled at one of the below locations:   [facility] [address] [place], [place] [address] [phone]  If scheduled at [facility], please arrive at the [facility] main entrance of [facility] 30 minutes prior to test start time. Proceed to the [facility] Radiology Department (first floor) to check-in and test prep.  Please follow these instructions carefully (unless otherwise directed):  Hold all erectile dysfunction medications at least 3 days (72 hrs) prior to test.  On the Night Before the Test: . Be sure to Drink plenty of water. . Do not consume any caffeinated/decaffeinated beverages or chocolate 12 hours prior to your test. . Do not take any antihistamines 12 hours prior to your test. . Do not take your Irbesartan the day before  On the Day of the Test: . Drink plenty of water. Do not drink any water within one hour of the test. . Do not eat any food 4 hours prior to the test. . You may take your regular medications prior to the test.  . HOLD your Irbesartan the day of test.      After the Test: . Drink plenty of water. . After receiving IV contrast, you may experience a mild flushed feeling. This is normal. .  On occasion, you may experience a mild rash up to 24 hours after the test. This is not dangerous. If this occurs, you can take Benadryl 25 mg and increase your fluid intake. . If you experience trouble breathing, this can be serious. If it is severe call 911 IMMEDIATELY. If it is mild, please call our office.   Once we have confirmed authorization from your insurance company, we will call you to set up a date and time for your test.   For non-scheduling related questions, please  contact the cardiac imaging nurse navigator should you have any questions/concerns: Marchia Bond, RN Navigator Cardiac Imaging [facility] [phone] mobile

## 2019-10-19 ENCOUNTER — Other Ambulatory Visit (HOSPITAL_COMMUNITY): Payer: Medicare Other

## 2019-10-23 ENCOUNTER — Other Ambulatory Visit: Payer: Self-pay

## 2019-10-23 ENCOUNTER — Ambulatory Visit (HOSPITAL_COMMUNITY)
Admission: RE | Admit: 2019-10-23 | Discharge: 2019-10-23 | Disposition: A | Discharge status: Home / Self Care | Payer: Medicare Other | Source: Ambulatory Visit | Attending: Cardiovascular Disease | Admitting: Cardiovascular Disease

## 2019-10-23 ENCOUNTER — Ambulatory Visit (HOSPITAL_BASED_OUTPATIENT_CLINIC_OR_DEPARTMENT_OTHER): Payer: Medicare Other

## 2019-10-23 ENCOUNTER — Other Ambulatory Visit: Payer: Medicare Other | Admitting: *Deleted

## 2019-10-23 DIAGNOSIS — I712 Thoracic aortic aneurysm, without rupture, unspecified: Secondary | ICD-10-CM

## 2019-10-23 DIAGNOSIS — I10 Essential (primary) hypertension: Secondary | ICD-10-CM

## 2019-10-23 DIAGNOSIS — R079 Chest pain, unspecified: Secondary | ICD-10-CM | POA: Diagnosis not present

## 2019-10-23 DIAGNOSIS — I351 Nonrheumatic aortic (valve) insufficiency: Secondary | ICD-10-CM | POA: Insufficient documentation

## 2019-10-23 DIAGNOSIS — E785 Hyperlipidemia, unspecified: Secondary | ICD-10-CM

## 2019-10-23 DIAGNOSIS — N189 Chronic kidney disease, unspecified: Secondary | ICD-10-CM

## 2019-10-23 DIAGNOSIS — Z86711 Personal history of pulmonary embolism: Secondary | ICD-10-CM | POA: Diagnosis not present

## 2019-10-23 DIAGNOSIS — M7989 Other specified soft tissue disorders: Secondary | ICD-10-CM | POA: Diagnosis not present

## 2019-10-23 DIAGNOSIS — Z01812 Encounter for preprocedural laboratory examination: Secondary | ICD-10-CM

## 2019-10-23 DIAGNOSIS — Z86718 Personal history of other venous thrombosis and embolism: Secondary | ICD-10-CM | POA: Insufficient documentation

## 2019-10-23 DIAGNOSIS — R0602 Shortness of breath: Secondary | ICD-10-CM

## 2019-10-23 NOTE — Progress Notes (Unsigned)
Patient needs BMET prior to CT.

## 2019-10-25 DIAGNOSIS — Z789 Other specified health status: Secondary | ICD-10-CM | POA: Insufficient documentation

## 2019-10-25 DIAGNOSIS — G4733 Obstructive sleep apnea (adult) (pediatric): Secondary | ICD-10-CM | POA: Insufficient documentation

## 2019-10-25 NOTE — Progress Notes (Signed)
The arousals were noted as: 75 were spontaneous, 26 were  associated with PLMs, 119 were associated with respiratory  events.  The patient had a Periodic Limb Movement (PLM) index of 44.6 and  the PLM Arousal index was 5.8/hour.  He reported leg cramping, causing prolonged wakefulness. Audio  and video analysis did not show any abnormal or unusual  movements, behaviors, phonations or vocalizations during his  sleep.   Nocturia times one. Snoring was noted.  EKG was in keeping with sinus rhythm, but bradycardic.   IMPRESSION:  1. Given the severe degree of Obstructive Sleep Apnea (OSA) with  an AHI of 36.7/h, the associated hypoxia ( 69 minutes of total  sleep time ) and supine exacerbation to AHI of 61/h, treatment of  sleep apnea is a must.  2. 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  3. Primary Snoring.  4. Poor sleep quality and efficiency.         RECOMMENDATIONS:   1. I would usually advise a full-night, attended, PAP titration  study to optimize therapy. given that he feels better when PAP is  used, yet cannot longer tolerate CPAP, a change to inspire  therapy is indicated.  2. I will let Dr Redmond Baseman know that the indication for INSPIRE  therapy is given.

## 2019-10-25 NOTE — Procedures (Signed)
Evaluation for INSPIRE therapy-   PATIENT'S NAME:  Adrian Franco, Adrian Franco DOB:      31-Jan-1946      MR#:    [id number]     DATE OF RECORDING: 10/06/2019 REFERRING M.D.:  Hessie Knows, MD Study Performed:   Baseline Polysomnogram HISTORY:  MARKES BAUGHN is a 74 year- old Caucasian male patient seen here on 09/16/2019 from ENT Dr. Redmond Baseman, MD. I see Guadlupe Spanish , a right -handed White or Caucasian male with a medical history of Atypical chest pain, BPH (benign prostatic hypertrophy), DVT (deep venous thrombosis) ([place]), GERD (gastroesophageal reflux disease), HTN (hypertension), Hyperlipemia, Hypogonadism in male, Insomnia, Localized edema, PE (pulmonary embolism), Renal insufficiency, SOB (shortness of breath), and Testicular hypofunction. He is a long time CPAP user. He had been infected with COVID 27 in November 20, and recovered after 10 days of severe illness, his sense of taste and smell have partially returned.    The patient had the first sleep study in the year 2005 and was diagnosed with apnea.  He has been using an auto titration CPAP machine and believes the average percentile is about 10 or 11 cm pressure.  He would like to be evaluated for alternatives to CPAP use and is interested in the [employer].  He presented to Dr. Hessie Knows on 7 December who described a 74 year old male patient with a chief complaint of CPAP use for sleep apnea for the last 10 to 12 years. He has noticed a benefit when using CPAP but he has never felt that the mask is fitting him well.  He has been on a full facemask which allows more air leakage and the machine wakes him up from air leaking and he feels at times also smothered. Tried nasal mask and pillows, was snoring loudly.  His machine makes puffing sounds. If he can sleep with CPAP, he feels good the next day.  He didn't bring his CPAP nor his wife's CPAP to this appointment.   Sleep relevant medical history: Nocturia- he wakes for 2-3  bathroom breaks, the first time at 1 AM.   The preferred sleep position is supine due to hip pain, with the support of 1 pillow. He snores while in supine. Dreams are reportedly rare. He can raise the headrest of the bed- 14 degrees    Social history:  The patient is full-time gainfully employed as a Nurse, learning disability, he is very, very busy currently. He lives in a household with his wife, the couple has adult children.  The patient currently works into the night.  Tobacco use; never.  ETOH use; rare, Caffeine intake in form of Coffee(1 cup )/Soda( none )/ Tea ( none ),nor energy drinks. He reports not feeling refreshed or restored in AM unless having used CPAP, with symptoms such as dry mouth and residual fatigue. Naps are taken infrequently, but he dozes off.   The patient endorsed the Epworth Sleepiness Scale at 7/24 points.   The patient's weight 194 pounds with a height of 69 (inches), resulting in a BMI of 28.7 kg/m2. The patient's neck circumference measured 17 inches.  CURRENT MEDICATIONS: Tylenol, Vitamin C, Vitamin B, Calcium PO, Colace, Tricor, Avapro, Mag-ox, Nitrostat, Prilosec, Xarelto, Requip, Zoloft, Zocor, Flomax, Vitamin D, Ambien, Testosterone cypionate.   PROCEDURE:  This is a multichannel digital polysomnogram utilizing the Somnostar 11.2 system.  Electrodes and sensors were applied and monitored per AASM Specifications.   EEG, EOG, Chin and Limb EMG, were sampled at 200  Hz.  ECG, Snore and Nasal Pressure, Thermal Airflow, Respiratory Effort, CPAP Flow and Pressure, Oximetry was sampled at 50 Hz. Digital video and audio were recorded.      BASELINE STUDY: Lights Out was at 22:06 and Lights On at 04:59.  Total recording time (TRT) was 413.5 minutes, with a total sleep time (TST) of 268 minutes. The patient's sleep latency was 127.5 minutes.  REM latency was 365.5 minutes. The sleep efficiency was 64.8%.     SLEEP ARCHITECTURE: WASO (Wake after sleep onset) was 110 minutes.  There  were 11.5 minutes in Stage N1, 191 minutes Stage N2, 53 minutes Stage N3 and 12.5 minutes in Stage REM. The percentage of Stage N1 was 4.3%, Stage N2 was 71.3%, Stage N3 was 19.8% and Stage R (REM sleep) was 4.7%. REM onset was very, very delayed.    RESPIRATORY ANALYSIS:  There were a total of 164 respiratory events:  68 obstructive apneas, 8 central apneas and 18 mixed apneas with a total of 94 apneas and an apnea index (AI) of 21. /hour. There were 70 hypopneas with a hypopnea index of 15.7 /hour.  The total APNEA/HYPOPNEA INDEX (AHI) was 36.7/hour. 11 events occurred in REM sleep and 155 events in NREM. The REM AHI was 52.8 /hour, versus a non-REM AHI of 35.9. The patient spent 128 minutes of total sleep time in the supine position and 140 minutes in non-supine. The supine AHI was 61.0/h versus a non-supine AHI of 14.6.  OXYGEN SATURATION & C02:  The Wake baseline 02 saturation was 92%, with the lowest being 68%. Time spent below 89% saturation equaled 69 minutes.  The arousals were noted as: 75 were spontaneous, 26 were associated with PLMs, 119 were associated with respiratory events. The patient had a Periodic Limb Movement (PLM) index of 44.6 and the PLM Arousal index was 5.8/hour. He reported leg cramping, causing prolonged wakefulness.  Audio and video analysis did not show any abnormal or unusual movements, behaviors, phonations or vocalizations during his sleep.   Nocturia times one. Snoring was noted. EKG was in keeping with sinus rhythm, but bradycardic.  IMPRESSION: 1. Given the severe degree of Obstructive Sleep Apnea (OSA) with an AHI of 36.7/h, the associated hypoxia ( 69 minutes of total sleep time ) and supine exacerbation to AHI of 61/h, treatment of sleep apnea is a must.  2. 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  3. Primary Snoring. 4. Poor sleep quality and efficiency.          RECOMMENDATIONS:  1. I would usually advise a full-night, attended, PAP titration study to optimize therapy. given that he feels better when PAP is used, yet cannot longer tolerate CPAP, a change to inspire therapy is indicated.  2. I will let Dr Redmond Baseman know that the indication for INSPIRE therapy is given.     I certify that I have reviewed the entire raw data recording prior to the issuance of this report in accordance with the Standards of Accreditation of the American Academy of Sleep Medicine (AASM)  Larey Seat, MD Diplomat, American Board of Psychiatry and Neurology  Diplomat, American Board of Sleep Medicine Market researcher, [facility] [employer]

## 2019-10-26 ENCOUNTER — Telehealth: Payer: Self-pay | Admitting: Neurology

## 2019-10-26 NOTE — Telephone Encounter (Signed)
Called and reviewed in detail with the patient's wife his sleep study. Informed that severe sleep apnea is present and that she would recommend treatment with CPAP. Advised that Dr Brett Fairy would want to completed a titration study. The wife states that he was unable to tolerate it and was hoping that the patient would be able to move forward with the inspire route. Advised I would pass this information along and see what next step would be if moved forward with inspire. She was appreciative for the information.

## 2019-10-26 NOTE — Telephone Encounter (Signed)
-----   Message from Larey Seat, MD sent at 10/25/2019  6:09 PM EST ----- The arousals were noted as: 75 were spontaneous, 26 were  associated with PLMs, 119 were associated with respiratory  events.  The patient had a Periodic Limb Movement (PLM) index of 44.6 and  the PLM Arousal index was 5.8/hour.  He reported leg cramping, causing prolonged wakefulness. Audio  and video analysis did not show any abnormal or unusual  movements, behaviors, phonations or vocalizations during his  sleep.   Nocturia times one. Snoring was noted.  EKG was in keeping with sinus rhythm, but bradycardic.   IMPRESSION:  1. Given the severe degree of Obstructive Sleep Apnea (OSA) with  an AHI of 36.7/h, the associated hypoxia ( 69 minutes of total  sleep time ) and supine exacerbation to AHI of 61/h, treatment of  sleep apnea is a must.  2. 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  3. Primary Snoring.  4. Poor sleep quality and efficiency.         RECOMMENDATIONS:   1. I would usually advise a full-night, attended, PAP titration  study to optimize therapy. given that he feels better when PAP is  used, yet cannot longer tolerate CPAP, a change to inspire  therapy is indicated.  2. I will let Dr Redmond Baseman know that the indication for INSPIRE  therapy is given.

## 2019-10-27 ENCOUNTER — Telehealth: Payer: Self-pay | Admitting: Cardiovascular Disease

## 2019-10-27 LAB — BASIC METABOLIC PANEL
BUN/Creatinine Ratio: 13 (ref 10–24)
BUN: 20 mg/dL (ref 8–27)
CO2: 16 mmol/L — ABNORMAL LOW (ref 20–29)
Calcium: 10 mg/dL (ref 8.6–10.2)
Chloride: 102 mmol/L (ref 96–106)
Creatinine, Ser: 1.55 mg/dL — ABNORMAL HIGH (ref 0.76–1.27)
GFR calc Af Amer: 51 mL/min/{1.73_m2} — ABNORMAL LOW (ref >59)
GFR calc non Af Amer: 44 mL/min/{1.73_m2} — ABNORMAL LOW (ref >59)
Glucose: 82 mg/dL (ref 65–99)
Potassium: 4.9 mmol/L (ref 3.5–5.2)
Sodium: 140 mmol/L (ref 134–144)

## 2019-10-27 NOTE — Telephone Encounter (Signed)
Patient's wife is aware of results and Dr. Kyla Balzarine instructions for patient to hold irbesartan the day of and the day after patient's CT, patient is not on a diuretic at this time.

## 2019-10-27 NOTE — Telephone Encounter (Signed)
New Message  Pt's wife called and needed to speak with nurse regarding results.   Please call back

## 2019-10-27 NOTE — Telephone Encounter (Signed)
Yes Dr. Redmond Baseman staff will take it from here and do the scheduling.

## 2019-10-28 ENCOUNTER — Other Ambulatory Visit: Payer: Self-pay | Admitting: Otolaryngology

## 2019-10-28 ENCOUNTER — Encounter (HOSPITAL_BASED_OUTPATIENT_CLINIC_OR_DEPARTMENT_OTHER): Payer: Self-pay | Admitting: Otolaryngology

## 2019-10-28 ENCOUNTER — Other Ambulatory Visit: Payer: Self-pay

## 2019-10-28 NOTE — Progress Notes (Signed)
Patient's chart reviewed with Dr Kalman Shan, [place] to proceed with surgery before scheduled CT coronary.

## 2019-10-29 ENCOUNTER — Other Ambulatory Visit (HOSPITAL_COMMUNITY): Admission: RE | Admit: 2019-10-29 | Payer: Medicare Other | Source: Ambulatory Visit

## 2019-11-02 ENCOUNTER — Ambulatory Visit (HOSPITAL_BASED_OUTPATIENT_CLINIC_OR_DEPARTMENT_OTHER): Admission: RE | Admit: 2019-11-02 | Payer: Medicare Other | Source: Home / Self Care | Admitting: Otolaryngology

## 2019-11-02 HISTORY — DX: Anxiety disorder, unspecified: F41.9

## 2019-11-02 HISTORY — DX: Aortic aneurysm of unspecified site, without rupture: I71.9

## 2019-11-02 HISTORY — DX: Sleep apnea, unspecified: G47.30

## 2019-11-02 HISTORY — DX: Depression, unspecified: F32.A

## 2019-11-02 HISTORY — DX: Restless legs syndrome: G25.81

## 2019-11-02 HISTORY — DX: Other chronic pain: G89.29

## 2019-11-02 SURGERY — DRUG INDUCED SLEEP ENDOSCOPY
Anesthesia: General

## 2019-11-13 ENCOUNTER — Other Ambulatory Visit (HOSPITAL_COMMUNITY): Payer: Self-pay | Admitting: Emergency Medicine

## 2019-11-13 ENCOUNTER — Telehealth (HOSPITAL_COMMUNITY): Payer: Self-pay | Admitting: Emergency Medicine

## 2019-11-13 ENCOUNTER — Encounter (HOSPITAL_COMMUNITY): Payer: Self-pay

## 2019-11-13 DIAGNOSIS — R079 Chest pain, unspecified: Secondary | ICD-10-CM

## 2019-11-13 MED ORDER — METOPROLOL TARTRATE 100 MG PO TABS: ORAL_TABLET | ORAL | 0 refills | Status: DC

## 2019-11-13 NOTE — Telephone Encounter (Signed)
Reaching out to patient to offer assistance regarding upcoming cardiac imaging study; pt verbalizes understanding of appt date/time, parking situation and where to check in, pre-test NPO status and medications ordered, and verified current allergies; name and call back number provided for further questions should they arise Marchia Bond RN Adrian Franco [phone] office [phone] cell  Pt verbalized understanding of IV hydration and purpose, when/where to check in Also verbalized understanding of metorpolol prescription and when to take it. Adrian Franco

## 2019-11-16 ENCOUNTER — Ambulatory Visit (HOSPITAL_COMMUNITY)
Admission: RE | Admit: 2019-11-16 | Discharge: 2019-11-16 | Disposition: A | Discharge status: Home / Self Care | Payer: Medicare Other | Source: Ambulatory Visit | Attending: Cardiovascular Disease | Admitting: Cardiovascular Disease

## 2019-11-16 ENCOUNTER — Other Ambulatory Visit: Payer: Self-pay

## 2019-11-16 ENCOUNTER — Ambulatory Visit (HOSPITAL_COMMUNITY)
Admission: RE | Admit: 2019-11-16 | Discharge: 2019-11-16 | Disposition: A | Discharge status: Home / Self Care | Payer: Medicare Other | Source: Ambulatory Visit | Attending: Cardiology | Admitting: Cardiology

## 2019-11-16 DIAGNOSIS — E785 Hyperlipidemia, unspecified: Secondary | ICD-10-CM | POA: Insufficient documentation

## 2019-11-16 DIAGNOSIS — N189 Chronic kidney disease, unspecified: Secondary | ICD-10-CM | POA: Diagnosis present

## 2019-11-16 DIAGNOSIS — I712 Thoracic aortic aneurysm, without rupture, unspecified: Secondary | ICD-10-CM

## 2019-11-16 DIAGNOSIS — R079 Chest pain, unspecified: Secondary | ICD-10-CM

## 2019-11-16 DIAGNOSIS — I351 Nonrheumatic aortic (valve) insufficiency: Secondary | ICD-10-CM | POA: Insufficient documentation

## 2019-11-16 DIAGNOSIS — I10 Essential (primary) hypertension: Secondary | ICD-10-CM | POA: Diagnosis present

## 2019-11-16 DIAGNOSIS — Z86718 Personal history of other venous thrombosis and embolism: Secondary | ICD-10-CM

## 2019-11-16 LAB — BASIC METABOLIC PANEL
Anion gap: 8 (ref 5–15)
BUN: 17 mg/dL (ref 8–23)
CO2: 27 mmol/L (ref 22–32)
Calcium: 9.6 mg/dL (ref 8.9–10.3)
Chloride: 101 mmol/L (ref 98–111)
Creatinine, Ser: 1.55 mg/dL — ABNORMAL HIGH (ref 0.61–1.24)
GFR calc Af Amer: 51 mL/min — ABNORMAL LOW (ref >60)
GFR calc non Af Amer: 44 mL/min — ABNORMAL LOW (ref >60)
Glucose, Bld: 92 mg/dL (ref 70–99)
Potassium: 4.8 mmol/L (ref 3.5–5.1)
Sodium: 136 mmol/L (ref 135–145)

## 2019-11-16 MED ORDER — SODIUM CHLORIDE 0.9 % WEIGHT BASED INFUSION
3.0000 mL/kg/h | INTRAVENOUS | Status: AC
  Administered 2019-11-16: 3 mL/kg/h via INTRAVENOUS

## 2019-11-16 MED ORDER — SODIUM CHLORIDE 0.9 % WEIGHT BASED INFUSION: 1.0000 mL/kg/h | INTRAVENOUS | Status: DC

## 2019-11-16 MED ORDER — NITROGLYCERIN 0.4 MG SL SUBL
0.8000 mg | SUBLINGUAL_TABLET | Freq: Once | SUBLINGUAL | Status: AC
  Administered 2019-11-16: 0.8 mg via SUBLINGUAL

## 2019-11-16 MED ORDER — NITROGLYCERIN 0.4 MG SL SUBL
SUBLINGUAL_TABLET | SUBLINGUAL | Status: AC
  Filled 2019-11-16: qty 2

## 2019-11-16 MED ORDER — IOHEXOL 350 MG/ML SOLN
80.0000 mL | Freq: Once | INTRAVENOUS | Status: AC | PRN
  Administered 2019-11-16: 80 mL via INTRAVENOUS

## 2019-11-16 NOTE — Progress Notes (Signed)
Spoke with [place] in CT that 1 hour of IVF would be complete at 1400.

## 2019-11-17 ENCOUNTER — Ambulatory Visit (HOSPITAL_COMMUNITY)
Admission: RE | Admit: 2019-11-17 | Discharge: 2019-11-17 | Disposition: A | Discharge status: Home / Self Care | Payer: Medicare Other | Source: Ambulatory Visit | Attending: Cardiovascular Disease | Admitting: Cardiovascular Disease

## 2019-11-17 DIAGNOSIS — I129 Hypertensive chronic kidney disease with stage 1 through stage 4 chronic kidney disease, or unspecified chronic kidney disease: Secondary | ICD-10-CM | POA: Diagnosis not present

## 2019-11-17 DIAGNOSIS — I10 Essential (primary) hypertension: Secondary | ICD-10-CM

## 2019-11-17 DIAGNOSIS — E785 Hyperlipidemia, unspecified: Secondary | ICD-10-CM

## 2019-11-17 DIAGNOSIS — Z86718 Personal history of other venous thrombosis and embolism: Secondary | ICD-10-CM | POA: Diagnosis not present

## 2019-11-17 DIAGNOSIS — R079 Chest pain, unspecified: Secondary | ICD-10-CM | POA: Insufficient documentation

## 2019-11-17 DIAGNOSIS — I712 Thoracic aortic aneurysm, without rupture, unspecified: Secondary | ICD-10-CM

## 2019-11-17 DIAGNOSIS — N189 Chronic kidney disease, unspecified: Secondary | ICD-10-CM | POA: Diagnosis not present

## 2019-11-17 DIAGNOSIS — I351 Nonrheumatic aortic (valve) insufficiency: Secondary | ICD-10-CM

## 2019-11-18 ENCOUNTER — Telehealth: Payer: Self-pay | Admitting: *Deleted

## 2019-11-18 DIAGNOSIS — E785 Hyperlipidemia, unspecified: Secondary | ICD-10-CM

## 2019-11-18 NOTE — Telephone Encounter (Signed)
-----   Message from Josue Hector, MD sent at 11/17/2019  3:34 PM EDT ----- FFR CT not bad enough to do cath suggests no blockages more than 70% See previous note about lipids f/u with me in 3 months

## 2019-11-18 NOTE — Telephone Encounter (Signed)
I spoke with patient and reviewed CT results with him.  He is taking simvastatin 20 mg daily. He had lab work done on March 5,2021. These can be found in my chart message dated 11/13/19--Scan 3. I told patient I would send message to Dr Johnsie Cancel regarding recent lab work and we would call him back if any changes needed to be made. I scheduled patient to see Dr Johnsie Cancel for follow up on June 7,2021

## 2019-11-18 NOTE — Telephone Encounter (Signed)
Left message to call office

## 2019-11-18 NOTE — Telephone Encounter (Signed)
LDL was 93 too high would increase simvastatin to 40 mg daily and repeat labs in 3 months

## 2019-11-24 ENCOUNTER — Other Ambulatory Visit: Payer: Self-pay | Admitting: Otolaryngology

## 2019-11-30 MED ORDER — SIMVASTATIN 40 MG PO TABS: 40.0000 mg | ORAL_TABLET | Freq: Every day | ORAL | 3 refills | Status: DC

## 2019-11-30 NOTE — Telephone Encounter (Signed)
Called patient back about Dr. Kyla Balzarine recommendations. Patient will start Simvastatin 40 mg and get lab work at [facility] on 03/09/20.

## 2019-12-01 ENCOUNTER — Other Ambulatory Visit: Payer: Self-pay

## 2019-12-01 ENCOUNTER — Encounter (HOSPITAL_BASED_OUTPATIENT_CLINIC_OR_DEPARTMENT_OTHER): Payer: Self-pay | Admitting: Otolaryngology

## 2019-12-05 ENCOUNTER — Other Ambulatory Visit (HOSPITAL_COMMUNITY)
Admission: RE | Admit: 2019-12-05 | Discharge: 2019-12-05 | Disposition: A | Discharge status: Home / Self Care | Payer: Medicare Other | Source: Ambulatory Visit | Attending: Otolaryngology | Admitting: Otolaryngology

## 2019-12-05 DIAGNOSIS — Z20822 Contact with and (suspected) exposure to covid-19: Secondary | ICD-10-CM | POA: Diagnosis not present

## 2019-12-05 DIAGNOSIS — Z01812 Encounter for preprocedural laboratory examination: Secondary | ICD-10-CM | POA: Insufficient documentation

## 2019-12-05 LAB — SARS CORONAVIRUS 2 (TAT 6-24 HRS): SARS Coronavirus 2: NEGATIVE

## 2019-12-09 ENCOUNTER — Ambulatory Visit (HOSPITAL_BASED_OUTPATIENT_CLINIC_OR_DEPARTMENT_OTHER): Payer: Medicare Other | Admitting: Anesthesiology

## 2019-12-09 ENCOUNTER — Encounter (HOSPITAL_BASED_OUTPATIENT_CLINIC_OR_DEPARTMENT_OTHER)
Admission: RE | Disposition: A | Discharge status: Home / Self Care | Payer: Self-pay | Source: Home / Self Care | Attending: Otolaryngology

## 2019-12-09 ENCOUNTER — Ambulatory Visit (HOSPITAL_BASED_OUTPATIENT_CLINIC_OR_DEPARTMENT_OTHER)
Admission: RE | Admit: 2019-12-09 | Discharge: 2019-12-09 | Disposition: A | Discharge status: Home / Self Care | Payer: Medicare Other | Attending: Otolaryngology | Admitting: Otolaryngology

## 2019-12-09 ENCOUNTER — Encounter (HOSPITAL_BASED_OUTPATIENT_CLINIC_OR_DEPARTMENT_OTHER): Payer: Self-pay | Admitting: Otolaryngology

## 2019-12-09 DIAGNOSIS — E785 Hyperlipidemia, unspecified: Secondary | ICD-10-CM | POA: Insufficient documentation

## 2019-12-09 DIAGNOSIS — Z8249 Family history of ischemic heart disease and other diseases of the circulatory system: Secondary | ICD-10-CM | POA: Insufficient documentation

## 2019-12-09 DIAGNOSIS — G4700 Insomnia, unspecified: Secondary | ICD-10-CM | POA: Insufficient documentation

## 2019-12-09 DIAGNOSIS — M549 Dorsalgia, unspecified: Secondary | ICD-10-CM | POA: Insufficient documentation

## 2019-12-09 DIAGNOSIS — Z86718 Personal history of other venous thrombosis and embolism: Secondary | ICD-10-CM | POA: Insufficient documentation

## 2019-12-09 DIAGNOSIS — I10 Essential (primary) hypertension: Secondary | ICD-10-CM | POA: Insufficient documentation

## 2019-12-09 DIAGNOSIS — F329 Major depressive disorder, single episode, unspecified: Secondary | ICD-10-CM | POA: Insufficient documentation

## 2019-12-09 DIAGNOSIS — G8929 Other chronic pain: Secondary | ICD-10-CM | POA: Diagnosis not present

## 2019-12-09 DIAGNOSIS — F419 Anxiety disorder, unspecified: Secondary | ICD-10-CM | POA: Insufficient documentation

## 2019-12-09 DIAGNOSIS — G4733 Obstructive sleep apnea (adult) (pediatric): Secondary | ICD-10-CM | POA: Insufficient documentation

## 2019-12-09 DIAGNOSIS — K219 Gastro-esophageal reflux disease without esophagitis: Secondary | ICD-10-CM | POA: Insufficient documentation

## 2019-12-09 DIAGNOSIS — Z86711 Personal history of pulmonary embolism: Secondary | ICD-10-CM | POA: Diagnosis not present

## 2019-12-09 DIAGNOSIS — N400 Enlarged prostate without lower urinary tract symptoms: Secondary | ICD-10-CM | POA: Diagnosis not present

## 2019-12-09 DIAGNOSIS — Z7901 Long term (current) use of anticoagulants: Secondary | ICD-10-CM | POA: Diagnosis not present

## 2019-12-09 DIAGNOSIS — Z79899 Other long term (current) drug therapy: Secondary | ICD-10-CM | POA: Insufficient documentation

## 2019-12-09 DIAGNOSIS — G2581 Restless legs syndrome: Secondary | ICD-10-CM | POA: Diagnosis not present

## 2019-12-09 HISTORY — PX: DRUG INDUCED ENDOSCOPY: SHX6808

## 2019-12-09 SURGERY — DRUG INDUCED SLEEP ENDOSCOPY
Anesthesia: General | Site: Nose

## 2019-12-09 MED ORDER — LACTATED RINGERS IV SOLN: INTRAVENOUS | Status: DC

## 2019-12-09 MED ORDER — OXYMETAZOLINE HCL 0.05 % NA SOLN
NASAL | Status: DC | PRN
  Administered 2019-12-09: 1 via TOPICAL

## 2019-12-09 MED ORDER — ACETAMINOPHEN 500 MG PO TABS
ORAL_TABLET | ORAL | Status: AC
  Filled 2019-12-09: qty 2

## 2019-12-09 MED ORDER — OXYMETAZOLINE HCL 0.05 % NA SOLN
NASAL | Status: AC
  Filled 2019-12-09: qty 30

## 2019-12-09 MED ORDER — PROPOFOL 500 MG/50ML IV EMUL
INTRAVENOUS | Status: DC | PRN
  Administered 2019-12-09: 100 ug/kg/min via INTRAVENOUS

## 2019-12-09 MED ORDER — FENTANYL CITRATE (PF) 100 MCG/2ML IJ SOLN: 25.0000 ug | INTRAMUSCULAR | Status: DC | PRN

## 2019-12-09 MED ORDER — LIDOCAINE 2% (20 MG/ML) 5 ML SYRINGE
INTRAMUSCULAR | Status: DC | PRN
  Administered 2019-12-09: 60 mg via INTRAVENOUS

## 2019-12-09 MED ORDER — FENTANYL CITRATE (PF) 100 MCG/2ML IJ SOLN
INTRAMUSCULAR | Status: DC | PRN
  Administered 2019-12-09: 50 ug via INTRAVENOUS

## 2019-12-09 MED ORDER — FENTANYL CITRATE (PF) 100 MCG/2ML IJ SOLN
INTRAMUSCULAR | Status: AC
  Filled 2019-12-09: qty 2

## 2019-12-09 MED ORDER — LIDOCAINE-EPINEPHRINE 1 %-1:100000 IJ SOLN
INTRAMUSCULAR | Status: AC
  Filled 2019-12-09: qty 1

## 2019-12-09 MED ORDER — ACETAMINOPHEN 500 MG PO TABS
1000.0000 mg | ORAL_TABLET | Freq: Once | ORAL | Status: AC
  Administered 2019-12-09: 12:00:00 1000 mg via ORAL

## 2019-12-09 SURGICAL SUPPLY — 14 items
CANISTER SUCT 1200ML W/VALVE (MISCELLANEOUS) ×2 IMPLANT
COVER WAND RF STERILE (DRAPES) IMPLANT
GLOVE BIO SURGEON STRL SZ7.5 (GLOVE) ×2 IMPLANT
GLOVE SURG SS PI 7.5 STRL IVOR (GLOVE) ×4 IMPLANT
KIT CLEAN ENDO (MISCELLANEOUS) ×2 IMPLANT
NEEDLE PRECISIONGLIDE 27X1.5 (NEEDLE) IMPLANT
PACK BASIN DAY SURGERY FS (CUSTOM PROCEDURE TRAY) ×2 IMPLANT
PATTIES SURGICAL .5 X3 (DISPOSABLE) ×2 IMPLANT
SHEET MEDIUM DRAPE 40X70 STRL (DRAPES) ×2 IMPLANT
SOL ANTI FOG 6CC (MISCELLANEOUS) ×1 IMPLANT
SOLUTION ANTI FOG 6CC (MISCELLANEOUS) ×1
SYR CONTROL 10ML LL (SYRINGE) IMPLANT
TOWEL GREEN STERILE FF (TOWEL DISPOSABLE) ×2 IMPLANT
TUBE CONNECTING 20X1/4 (TUBING) ×2 IMPLANT

## 2019-12-09 NOTE — H&P (Signed)
Adrian Franco is an 74 y.o. male.   Chief Complaint: sleep apnea HPI: 74 year old male with obstructive sleep apnea having difficulty tolerating CPAP.  He presents for sleep endoscopy.  Past Medical History:  Diagnosis Date  . Anxiety   . Aortic aneurysm ([place])    followed by Dr Johnsie Cancel  . Atypical chest pain   . BPH (benign prostatic hypertrophy)   . Chronic back pain   . Depression   . DVT (deep venous thrombosis) ([place])   . GERD (gastroesophageal reflux disease)   . HTN (hypertension)   . Hyperlipemia   . Hypogonadism in male   . Insomnia   . Localized edema   . PE (pulmonary embolism)   . Renal insufficiency   . Restless leg syndrome   . Sleep apnea    severe OSA-cannot tolerate CPAP  . SOB (shortness of breath)   . Testicular hypofunction     Past Surgical History:  Procedure Laterality Date  . INGUINAL HERNIA REPAIR    . KIDNEY STONE SURGERY     stent  . ROTATOR CUFF REPAIR Right     Family History  Problem Relation Age of Onset  . Hyperlipidemia Mother   . Hyperlipidemia Father   . Heart attack Father   . Healthy Son   . Healthy Brother   . Other Brother   . Healthy Sister   . Healthy Son    Social History:  reports that he has never smoked. He has never used smokeless tobacco. He reports that he does not drink alcohol or use drugs.  Allergies:  Allergies  Allergen Reactions  . Sulfa Antibiotics Itching  . Amoxapine And Related Hives and Rash  . Lotensin [Benazepril Hcl] Hives and Rash    Medications Prior to Admission  Medication Sig Dispense Refill  . acetaminophen (TYLENOL) 500 MG tablet Take 500-1,000 mg by mouth every 6 (six) hours as needed for mild pain or moderate pain.    . Ascorbic Acid (VITAMIN C PO) Take 1 tablet by mouth daily.    . B Complex Vitamins (VITAMIN B COMPLEX PO) Take 1 tablet by mouth daily.    . fenofibrate (TRICOR) 145 MG tablet Take 145 mg by mouth daily.    Marland Kitchen gabapentin (NEURONTIN) 300 MG capsule Take 300 mg by mouth  at bedtime.    . irbesartan (AVAPRO) 150 MG tablet Take 150 mg by mouth daily.    . Multiple Vitamins-Minerals (ONE-A-DAY MENS 50+ ADVANTAGE) TABS Take 1 tablet by mouth daily with breakfast.    . omeprazole (PRILOSEC) 40 MG capsule Take 40 mg by mouth daily.    . potassium chloride (KLOR-CON) 10 MEQ tablet Take 10 mEq by mouth daily.    . rivaroxaban (XARELTO) 20 MG TABS tablet Take 1 tablet (20 mg total) by mouth daily with supper. 90 tablet 3  . rOPINIRole (REQUIP) 2 MG tablet Take 2 mg by mouth at bedtime.     . sertraline (ZOLOFT) 100 MG tablet Take 100 mg by mouth daily.    . simvastatin (ZOCOR) 40 MG tablet Take 1 tablet (40 mg total) by mouth daily. 90 tablet 3  . tamsulosin (FLOMAX) 0.4 MG CAPS capsule Take 0.4 mg by mouth 2 (two) times daily.     Marland Kitchen tiZANidine (ZANAFLEX) 2 MG tablet Take 1 tablet by mouth daily as needed.    Marland Kitchen VITAMIN D PO Take 1 tablet by mouth daily.    Marland Kitchen zolpidem (AMBIEN) 10 MG tablet Take 10 mg  by mouth at bedtime as needed for sleep.    Marland Kitchen CALCIUM PO Take 1 tablet by mouth daily.    Marland Kitchen docusate sodium (COLACE) 100 MG capsule Take 100 mg by mouth daily as needed for mild constipation.    . magnesium oxide (MAG-OX) 400 MG tablet Take 400 mg by mouth every morning.    . nitroGLYCERIN (NITROSTAT) 0.4 MG SL tablet Place 0.4 mg under the tongue every 5 (five) minutes as needed. For chest pain    . Testosterone Cypionate 200 MG/ML SOLN Inject as directed. Injection every two weeks      No results found for this or any previous visit (from the past 48 hour(s)). No results found.  Review of Systems  All other systems reviewed and are negative.   Blood pressure (!) 151/71, pulse (!) 53, temperature 98 F (36.7 C), temperature source Temporal, height 5\' 8"  (1.727 m), weight 89.6 kg, SpO2 97 %. Physical Exam  Constitutional: He is oriented to person, place, and time. He appears well-developed and well-nourished. No distress.  HENT:  Head: Normocephalic and atraumatic.   Right Ear: External ear normal.  Left Ear: External ear normal.  Nose: Nose normal.  Mouth/Throat: Oropharynx is clear and moist.  Eyes: Pupils are equal, round, and reactive to light. Conjunctivae and EOM are normal.  Cardiovascular: Normal rate.  Respiratory: Effort normal.  Musculoskeletal:     Cervical back: Normal range of motion and neck supple.  Neurological: He is alert and oriented to person, place, and time. No cranial nerve deficit.  Skin: Skin is warm and dry.  Psychiatric: He has a normal mood and affect. His behavior is normal. Judgment and thought content normal.     Assessment/Plan Sleep apnea  To OR for DISE.  Melida Quitter, MD 12/09/2019, 12:48 PM

## 2019-12-09 NOTE — Transfer of Care (Signed)
Immediate Anesthesia Transfer of Care Note  Patient: Adrian Franco  Procedure(s) Performed: DRUG INDUCED ENDOSCOPY (N/A Nose)  Patient Location: PACU  Anesthesia Type:MAC  Level of Consciousness: awake  Airway & Oxygen Therapy: Patient Spontanous Breathing and Patient connected to face mask oxygen  Post-op Assessment: Report given to RN and Post -op Vital signs reviewed and stable  Post vital signs: Reviewed and stable  Last Vitals:  Vitals Value Taken Time  BP    Temp    Pulse 50 12/09/19 1314  Resp 14 12/09/19 1314  SpO2 99 % 12/09/19 1314  Vitals shown include unvalidated device data.  Last Pain:  Vitals:   12/09/19 1153  TempSrc: Temporal  PainSc: 0-No pain         Complications: No apparent anesthesia complications

## 2019-12-09 NOTE — Discharge Instructions (Signed)
  Next dose of tylenol at 5:30 PM  Post Anesthesia Home Care Instructions  Activity: Get plenty of rest for the remainder of the day. A responsible individual must stay with you for 24 hours following the procedure.  For the next 24 hours, DO NOT: -Drive a car -Paediatric nurse -Drink alcoholic beverages -Take any medication unless instructed by your physician -Make any legal decisions or sign important papers.  Meals: Start with liquid foods such as gelatin or soup. Progress to regular foods as tolerated. Avoid greasy, spicy, heavy foods. If nausea and/or vomiting occur, drink only clear liquids until the nausea and/or vomiting subsides. Call your physician if vomiting continues.  Special Instructions/Symptoms: Your throat may feel dry or sore from the anesthesia or the breathing tube placed in your throat during surgery. If this causes discomfort, gargle with warm salt water. The discomfort should disappear within 24 hours.  If you had a scopolamine patch placed behind your ear for the management of post- operative nausea and/or vomiting:  1. The medication in the patch is effective for 72 hours, after which it should be removed.  Wrap patch in a tissue and discard in the trash. Wash hands thoroughly with soap and water. 2. You may remove the patch earlier than 72 hours if you experience unpleasant side effects which may include dry mouth, dizziness or visual disturbances. 3. Avoid touching the patch. Wash your hands with soap and water after contact with the patch.

## 2019-12-09 NOTE — Op Note (Signed)
NAME: EZERIAH, LUTY MEDICAL RECORD VZ:[id number] ACCOUNT [id number] DATE OF BIRTH:[birth year] FACILITY: [facility] LOCATION: [facility] PHYSICIAN:Elsye Mccollister Guido Sander, MD  OPERATIVE REPORT  DATE OF PROCEDURE:  12/09/2019  PREOPERATIVE DIAGNOSIS:  Obstructive sleep apnea.  POSTOPERATIVE DIAGNOSIS:  Obstructive sleep apnea.  PROCEDURE:  Drug-induced sleep endoscopy.  SURGEON:  Melida Quitter, MD  ANESTHESIA:  IV sedation.  COMPLICATIONS:  None.  INDICATIONS:  The patient is a 74 year old male with a history of obstructive sleep apnea who has been unable to tolerate CPAP.  He presents to the operating room for sleep endoscopy.  FINDINGS:  At the level of the velum, there was majority anteroposterior collapse with up to maybe 20% collapse from the sidewalls.  This makes him a candidate for hypoglossal nerve stimulator placement.  There was very little collapse at the tongue base  or epiglottis with some sidewall collapse at that level that was not obstructive.  DESCRIPTION OF PROCEDURE:  The patient was identified in the holding room.  Informed consent having been obtained including discussion of risks, benefits and alternatives, the patient was brought to the operative suite and put on the operative table in  supine position.  Anesthesia was induced.  The patient was given IV sedation to simulate natural sleep.  An Afrin-soaked pledget was placed in the right side of the nose for a few minutes and then removed.  The flexible laryngoscope was then passed  through the right nasal passage to view the nasopharynx and pharynx.  Findings are noted above.  The exam was recorded.  After examining the various levels, the flexible scope was removed and the patient returned to anesthesia for wakeup.  He was moved  to the recovery room in stable condition.  VN/NUANCE  D:12/09/2019 T:12/09/2019 JOB:[id number]

## 2019-12-09 NOTE — Brief Op Note (Signed)
12/09/2019  1:15 PM  PATIENT:  Adrian Franco  74 y.o. male  PRE-OPERATIVE DIAGNOSIS:  OBSTRUCTIVE SLEEP APNEA  POST-OPERATIVE DIAGNOSIS:  OBSTRUCTIVE SLEEP APNEA  PROCEDURE:  Procedure(s): DRUG INDUCED ENDOSCOPY (N/A)  SURGEON:  Surgeon(s) and Role:    Melida Quitter, MD - Primary  PHYSICIAN ASSISTANT:   ASSISTANTS: none   ANESTHESIA:   IV sedation  EBL: None  BLOOD ADMINISTERED:none  DRAINS: none   LOCAL MEDICATIONS USED:  NONE  SPECIMEN:  No Specimen  DISPOSITION OF SPECIMEN:  N/A  COUNTS:  YES  TOURNIQUET:  * No tourniquets in log *  DICTATION: .Other Dictation: Dictation Number [id number]  PLAN OF CARE: Discharge to home after PACU  PATIENT DISPOSITION:  PACU - hemodynamically stable.   Delay start of Pharmacological VTE agent (>24hrs) due to surgical blood loss or risk of bleeding: no

## 2019-12-09 NOTE — Anesthesia Preprocedure Evaluation (Addendum)
Anesthesia Evaluation    Reviewed: Allergy & Precautions, Patient's Chart, lab work & pertinent test results  Airway Mallampati: II  TM Distance: >3 FB Neck ROM: Full    Dental no notable dental hx. (+) Missing, Dental Advisory Given,    Pulmonary sleep apnea , PE (on xarelto)   Pulmonary exam normal breath sounds clear to auscultation       Cardiovascular hypertension, Pt. on medications + DVT (on xarelto)  Normal cardiovascular exam Rhythm:Regular Rate:Normal  HLD  TTE 09/2019 1. Left ventricular ejection fraction, by estimation, is 55 to 60%. The left ventricle has normal function. The left ventricle has no regional wall motion abnormalities. Left ventricular diastolic parameters are consistent with Grade diastolic dysfunction (impaired relaxation).  2. Right ventricular systolic function is normal. The right ventricular size is normal. Tricuspid regurgitation signal is inadequate for assessing PA pressure.  3. The mitral valve is normal in structure and function. Trivial mitral valve regurgitation. No evidence of mitral stenosis.  4. The aortic valve is tricuspid. Aortic valve regurgitation is mild to moderate. No aortic stenosis is present.  5. Aortic dilatation noted. 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 This has not significantly changed from prior echocardiogram, however does vary  from cross sectional imaging measurements. Consider gated CTA aorta to verify measurements.  Stress Test 2017 Nuclear stress EF: 55%. Blood pressure demonstrated a hypertensive response to exercise. There was no ST segment deviation noted during stress. No T wave inversion was noted during stress. The study is normal. This is a low risk study. The left ventricular ejection fraction is normal (55-65%).   Neuro/Psych PSYCHIATRIC DISORDERS Anxiety Depression negative  neurological ROS     GI/Hepatic Neg liver ROS, GERD  Medicated and Controlled,  Endo/Other  negative endocrine ROS  Renal/GU negative Renal ROS  negative genitourinary   Musculoskeletal negative musculoskeletal ROS (+)   Abdominal   Peds  Hematology negative hematology ROS (+)   Anesthesia Other Findings   Reproductive/Obstetrics                            Anesthesia Physical Anesthesia Plan  ASA: III  Anesthesia Plan: General   Post-op Pain Management:    Induction: Intravenous  PONV Risk Score and Plan: 2 and Dexamethasone, Ondansetron and Propofol infusion  Airway Management Planned: Natural Airway and Mask  Additional Equipment:   Intra-op Plan:   Post-operative Plan:   Informed Consent: I have reviewed the patients History and Physical, chart, labs and discussed the procedure including the risks, benefits and alternatives for the proposed anesthesia with the patient or authorized representative who has indicated his/her understanding and acceptance.     Dental advisory given  Plan Discussed with: CRNA  Anesthesia Plan Comments:         Anesthesia Quick Evaluation

## 2019-12-09 NOTE — Anesthesia Postprocedure Evaluation (Signed)
Anesthesia Post Note  Patient: Adrian Franco  Procedure(s) Performed: DRUG INDUCED ENDOSCOPY (N/A Nose)     Patient location during evaluation: PACU Anesthesia Type: General Level of consciousness: awake and alert Pain management: pain level controlled Vital Signs Assessment: post-procedure vital signs reviewed and stable Respiratory status: spontaneous breathing, nonlabored ventilation, respiratory function stable and patient connected to nasal cannula oxygen Cardiovascular status: blood pressure returned to baseline and stable Postop Assessment: no apparent nausea or vomiting Anesthetic complications: no    Last Vitals:  Vitals:   12/09/19 1330 12/09/19 1342  BP: 133/72 (!) 143/74  Pulse: (!) 49 (!) 50  Resp: 12 16  Temp:  36.6 C  SpO2: 96% 99%    Last Pain:  Vitals:   12/09/19 1342  TempSrc:   PainSc: 0-No pain                 Derl Abalos L Kaenan Jake

## 2019-12-11 ENCOUNTER — Encounter: Payer: Self-pay | Admitting: *Deleted

## 2020-01-08 ENCOUNTER — Other Ambulatory Visit: Payer: Self-pay | Admitting: Otolaryngology

## 2020-01-28 NOTE — Progress Notes (Signed)
Patient location : Home Physician location : Office   Cardiology Office Note    Date:  02/01/2020   ID:  Adrian Franco, DOB 10-16-1945, MRN [id number]  PCP:  [name], MD  Cardiologist: [name]   CC:  Follow up  History of Present Illness:   74 y.o. with history of HLD, HTN, CKD baseline Cr around 1.3. Diagnosed with submassive PE in August of 2017. Seen by Dr Donzetta Matters [facility] and ? If event ppt by testosterone supplements. 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 Anxiety   . Aortic aneurysm ([place])    followed by Dr Johnsie Cancel  . Atypical chest pain   . BPH (benign prostatic hypertrophy)   . Chronic back pain   . Depression   . DVT (deep venous thrombosis) ([place])   . GERD (gastroesophageal reflux disease)   . HTN (hypertension)   . Hyperlipemia   . Hypogonadism in male   . Insomnia   . Localized edema   . PE (pulmonary embolism)   . Renal insufficiency   . Restless leg syndrome   . Sleep apnea    severe OSA-cannot tolerate CPAP  . SOB  (shortness of breath)   . Testicular hypofunction     Past Surgical History:  Procedure Laterality Date  . DRUG INDUCED ENDOSCOPY N/A 12/09/2019   Procedure: DRUG INDUCED ENDOSCOPY;  Surgeon: Melida Quitter, MD;  Location: [facility];  Service: ENT;  Laterality: N/A;  . INGUINAL HERNIA REPAIR    . KIDNEY STONE SURGERY     stent  . ROTATOR CUFF REPAIR Right     Current Medications: Outpatient Medications Prior to Visit  Medication Sig Dispense Refill  . acetaminophen (TYLENOL) 500 MG tablet Take 500-1,000 mg by mouth every 6 (six) hours as needed for mild pain or moderate pain.    . Ascorbic Acid (VITAMIN C PO) Take 1 tablet by mouth daily.    . B Complex Vitamins (VITAMIN B COMPLEX PO) Take 1 tablet by mouth daily.    Marland Kitchen docusate sodium (COLACE) 100 MG capsule Take 100 mg by mouth daily as needed for mild constipation.    . fenofibrate (TRICOR) 145 MG tablet Take 145 mg by mouth daily.    Marland Kitchen gabapentin (NEURONTIN) 300 MG capsule Take 300 mg by mouth at bedtime.    . irbesartan (AVAPRO) 150 MG tablet Take 150 mg by mouth daily.    . Multiple  Vitamins-Minerals (ONE-A-DAY MENS 50+ ADVANTAGE) TABS Take 1 tablet by mouth daily with breakfast.    . nitroGLYCERIN (NITROSTAT) 0.4 MG SL tablet Place 0.4 mg under the tongue every 5 (five) minutes as needed. For chest pain    . omeprazole (PRILOSEC) 40 MG capsule Take 40 mg by mouth daily.    . potassium chloride (KLOR-CON) 10 MEQ tablet Take 10 mEq by mouth daily.    . rivaroxaban (XARELTO) 20 MG TABS tablet Take 1 tablet (20 mg total) by mouth daily with supper. 90 tablet 3  . rOPINIRole (REQUIP) 2 MG tablet Take 2 mg by mouth at bedtime.     . sertraline (ZOLOFT) 100 MG tablet Take 100 mg by mouth daily.    . simvastatin (ZOCOR) 40 MG tablet Take 1 tablet (40 mg total) by mouth daily. 90 tablet 3  . tamsulosin (FLOMAX) 0.4 MG CAPS capsule Take 0.4 mg by mouth 2 (two) times daily.     . Testosterone Cypionate 200 MG/ML SOLN Inject  as directed. Injection every two weeks    . tiZANidine (ZANAFLEX) 2 MG tablet Take 1 tablet by mouth daily as needed.    Marland Kitchen VITAMIN D PO Take 1 tablet by mouth daily.    Marland Kitchen zolpidem (AMBIEN) 10 MG tablet Take 10 mg by mouth at bedtime as needed for sleep.     No facility-administered medications prior to visit.     Allergies:   Other, Sulfa antibiotics, Amoxapine and related, and Lotensin [benazepril hcl]   Social History   Socioeconomic History  . Marital status: Married    Spouse name: Not on file  . Number of children: Not on file  . Years of education: Not on file  . Highest education level: Not on file  Occupational History  . Not on file  Tobacco Use  . Smoking status: Never Smoker  . Smokeless tobacco: Never Used  Substance and Sexual Activity  . Alcohol use: No    Alcohol/week: 0.0 standard drinks  . Drug use: No  . Sexual activity: Yes    Comment: married  Other Topics Concern  . Not on file  Social History Narrative  . Not on file   Social Determinants of Health   Financial Resource Strain:   . Difficulty of Paying Living Expenses:   Food Insecurity:   . Worried About Charity fundraiser in the Last Year:   . Arboriculturist in the Last Year:   Transportation Needs:   . Film/video editor (Medical):   Marland Kitchen Lack of Transportation (Non-Medical):   Physical Activity:   . Days of Exercise per Week:   . Minutes of Exercise per Session:   Stress:   . Feeling of Stress :   Social Connections:   . Frequency of Communication with Friends and Family:   . Frequency of Social Gatherings with Friends and Family:   . Attends Religious Services:   . Active Member of Clubs or Organizations:   . Attends Archivist Meetings:   Marland Kitchen Marital Status:      Family History:  The patient'sfamily history includes Healthy in his brother, sister, son, and son; Heart attack in his father; Hyperlipidemia in his father and mother; Other in his brother.     ROS:   Please  see the history of present illness.    ROS All other systems reviewed and are negative.   PHYSICAL EXAM:   VS:  BP 116/68   Pulse 66  Ht 5\' 8"  (1.727 m)   Wt 190 lb (86.2 kg)   SpO2 98%   BMI 28.89 kg/m     Affect appropriate Healthy:  appears stated age 50: normal Neck supple with no adenopathy JVP normal no bruits no thyromegaly Lungs clear with no wheezing and good diaphragmatic motion Heart:  S1/S2 AR murmur, no rub, gallop or click PMI normal Abdomen: benighn, BS positve, no tenderness, no AAA no bruit.  No HSM or HJR Distal pulses intact with no bruits No edema Neuro non-focal Skin warm and dry No muscular weakness   Wt Readings from Last 3 Encounters:  02/01/20 190 lb (86.2 kg)  12/09/19 197 lb 8.5 oz (89.6 kg)  11/16/19 190 lb (86.2 kg)      Studies/Labs Reviewed:   EKG:  SR rate 51 PR 238 normal otherwise   Recent Labs: 11/16/2019: BUN 17; Creatinine, Ser 1.55; Potassium 4.8; Sodium 136   Lipid Panel No results found for: CHOL, TRIG, HDL, CHOLHDL, VLDL, LDLCALC, LDLDIRECT  Additional studies/ records that were reviewed today include:  04/05/16 Study Highlights    Nuclear stress EF: 55%.  Blood pressure demonstrated a hypertensive response to exercise.  There was no ST segment deviation noted during stress.  No T wave inversion was noted during stress.  The study is normal.  This is a low risk study.  The left ventricular ejection fraction is normal (55-65%).     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 Donzetta Matters called it unprovoked. 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 I reviewed his echo, he has a trileaflet valve.mild to moderate AR with root 4.5 cm f/u echo ordered  For February 2022     CKD: Cr about 1.5    Low T: he and wife are worried that he will feel badly off his supplementation. I am not sure if this was the inciting cause of his DVT/PE, but if he does end up feeling badly off the testosterone therapy and his PCP thinks he needs it long term, he may need to continue his oral anticoagulation.  Aortic Aneurysm :   Cardiac CTA 11/17/19 Aorta 4.2 cm   CAD:  Calcium score 329 , 60 th percentile for age Moderate calcified stenosis in ostial/proximal LAD and ostial RCA FFR CT negative continue medical Rx  FFR CT in LAD was 0.84 will need to follow closely    Lipid / Liver next month [facility] Echo 09/2020 visit same day   Signed, Jenkins Rouge, MD  02/01/2020 2:06 PM    [facility], [place], [place]  [address] Phone: [phone]; Fax: [phone]

## 2020-02-01 ENCOUNTER — Encounter: Payer: Self-pay | Admitting: Cardiovascular Disease

## 2020-02-01 ENCOUNTER — Other Ambulatory Visit: Payer: Self-pay

## 2020-02-01 ENCOUNTER — Ambulatory Visit (INDEPENDENT_AMBULATORY_CARE_PROVIDER_SITE_OTHER): Payer: Medicare Other | Admitting: Cardiovascular Disease

## 2020-02-01 VITALS — BP 116/68 | HR 66 | Ht 68.0 in | Wt 190.0 lb

## 2020-02-01 DIAGNOSIS — I10 Essential (primary) hypertension: Secondary | ICD-10-CM | POA: Diagnosis not present

## 2020-02-01 DIAGNOSIS — I351 Nonrheumatic aortic (valve) insufficiency: Secondary | ICD-10-CM

## 2020-02-01 DIAGNOSIS — E785 Hyperlipidemia, unspecified: Secondary | ICD-10-CM | POA: Diagnosis not present

## 2020-02-01 NOTE — Patient Instructions (Addendum)
Medication Instructions:  *If you need a refill on your cardiac medications before your next appointment, please call your pharmacy*  Lab Work: Your physician recommends that you return for lab work in: 1 month for fasting lipid and liver panel.  If you have labs (blood work) drawn today and your tests are completely normal, you will receive your results only by: Marland Kitchen MyChart Message (if you have MyChart) OR . A paper copy in the mail If you have any lab test that is abnormal or we need to change your treatment, we will call you to review the results.  Testing/Procedures: Your physician has requested that you have an echocardiogram in February 2022. Echocardiography is a painless test that uses sound waves to create images of your heart. It provides your doctor with information about the size and shape of your heart and how well your heart's chambers and valves are working. This procedure takes approximately one hour. There are no restrictions for this procedure.  Follow-Up: At [facility], you and your health needs are our priority.  As part of our continuing mission to provide you with exceptional heart care, we have created designated Provider Care Teams.  These Care Teams include your primary Cardiologist (physician) and Advanced Practice Providers (APPs -  Physician Assistants and Nurse Practitioners) who all work together to provide you with the care you need, when you need it.  We recommend signing up for the patient portal called "MyChart".  Sign up information is provided on this After Visit Summary.  MyChart is used to connect with patients for Virtual Visits (Telemedicine).  Patients are able to view lab/test results, encounter notes, upcoming appointments, etc.  Non-urgent messages can be sent to your provider as well.   To learn more about what you can do with MyChart, go to [email or web address].    Your next appointment:   In February  The format for your next appointment:    In Person  Provider:   You may see Jenkins Rouge, MD or one of the following Advanced Practice Providers on your designated Care Team:    Truitt Merle, NP  Cecilie Kicks, NP  Kathyrn Drown, NP

## 2020-02-15 ENCOUNTER — Encounter (HOSPITAL_COMMUNITY)
Admission: RE | Admit: 2020-02-15 | Discharge: 2020-02-15 | Disposition: A | Discharge status: Home / Self Care | Payer: Medicare Other | Source: Ambulatory Visit | Attending: Otolaryngology | Admitting: Otolaryngology

## 2020-02-15 ENCOUNTER — Encounter (HOSPITAL_COMMUNITY): Payer: Self-pay

## 2020-02-15 ENCOUNTER — Other Ambulatory Visit (HOSPITAL_COMMUNITY)
Admission: RE | Admit: 2020-02-15 | Discharge: 2020-02-15 | Disposition: A | Discharge status: Home / Self Care | Payer: Medicare Other | Source: Ambulatory Visit | Attending: Otolaryngology | Admitting: Otolaryngology

## 2020-02-15 ENCOUNTER — Other Ambulatory Visit: Payer: Self-pay

## 2020-02-15 DIAGNOSIS — Z01812 Encounter for preprocedural laboratory examination: Secondary | ICD-10-CM | POA: Diagnosis present

## 2020-02-15 DIAGNOSIS — Z20822 Contact with and (suspected) exposure to covid-19: Secondary | ICD-10-CM | POA: Insufficient documentation

## 2020-02-15 HISTORY — DX: Personal history of urinary calculi: Z87.442

## 2020-02-15 LAB — CBC
HCT: 42.9 % (ref 39.0–52.0)
Hemoglobin: 13.9 g/dL (ref 13.0–17.0)
MCH: 30.3 pg (ref 26.0–34.0)
MCHC: 32.4 g/dL (ref 30.0–36.0)
MCV: 93.7 fL (ref 80.0–100.0)
Platelets: 251 10*3/uL (ref 150–400)
RBC: 4.58 MIL/uL (ref 4.22–5.81)
RDW: 11.9 % (ref 11.5–15.5)
WBC: 5.1 10*3/uL (ref 4.0–10.5)
nRBC: 0 % (ref 0.0–0.2)

## 2020-02-15 LAB — BASIC METABOLIC PANEL
Anion gap: 8 (ref 5–15)
BUN: 16 mg/dL (ref 8–23)
CO2: 26 mmol/L (ref 22–32)
Calcium: 9.2 mg/dL (ref 8.9–10.3)
Chloride: 106 mmol/L (ref 98–111)
Creatinine, Ser: 1.56 mg/dL — ABNORMAL HIGH (ref 0.61–1.24)
GFR calc Af Amer: 50 mL/min — ABNORMAL LOW (ref >60)
GFR calc non Af Amer: 43 mL/min — ABNORMAL LOW (ref >60)
Glucose, Bld: 88 mg/dL (ref 70–99)
Potassium: 4.4 mmol/L (ref 3.5–5.1)
Sodium: 140 mmol/L (ref 135–145)

## 2020-02-15 LAB — SARS CORONAVIRUS 2 (TAT 6-24 HRS): SARS Coronavirus 2: NEGATIVE

## 2020-02-15 NOTE — Progress Notes (Signed)
PCP - Dr. Garen Grams in [place], [place] Cardiologist - [name]  Chest x-ray -  Not indicated EKG - 12/09/19 Stress Test - 2017 ECHO - 10/17/19 Cardiac Cath - Denies  Sleep Study - Yes has OSA CPAP - Not currently using, cannot tolerate  DM - Denies  Blood Thinner Instructions: N/A Aspirin Instructions:N/A Instructed to hold vitamins, Aspirin, aspirin containing products, herbal supplements, and fish oil.  COVID TEST- 02/15/20  Anesthesia review: Cardiac hx   Patient denies shortness of breath, fever, cough and chest pain at PAT appointment  All instructions explained to the patient, with a verbal understanding of the material. Patient agrees to go over the instructions while at home for a better understanding. Patient also instructed to self quarantine after being tested for COVID-19. The opportunity to ask questions was provided.

## 2020-02-15 NOTE — Pre-Procedure Instructions (Signed)
Adrian Franco  02/15/2020    Your procedure is scheduled on Wednesday, June 23..  Report to [facility] Admitting at 5:30 A.M.                        Your surgery or procedure is scheduled to start at 7:30 AM   Call this number if you have problems the morning of surgery: [phone]  This is the number for the Pre- Surgical Desk.      For any other questions, please call [phone], Monday - Friday 8 AM - 4 PM.   Remember:  Do not eat or drink after midnight Tuesday.   Take these medicines the morning of surgery with A SIP OF WATER:  sertraline (ZOLOFT)             simvastatin (ZOCOR)   Take if needed:  acetaminophen (TYLENOL) tiZANidine (ZANAFLEX) nitroGLYCERIN (NITROSTAT)   Special instructions:  If you use a CPAP, bring the mask to the hospital with you.   [place]- Preparing For Surgery  Before surgery, you can play an important role. Because skin is not sterile, your skin needs to be as free of germs as possible. You can reduce the number of germs on your skin by washing with CHG (chlorahexidine gluconate) Soap before surgery.  CHG is an antiseptic cleaner which kills germs and bonds with the skin to continue killing germs even after washing.    Oral Hygiene is also important to reduce your risk of infection.  Remember - BRUSH YOUR TEETH THE MORNING OF SURGERY WITH YOUR REGULAR TOOTHPASTE  Please do not use if you have an allergy to CHG or antibacterial soaps. If your skin becomes reddened/irritated stop using the CHG.  Do not shave (including legs and underarms) for at least 48 hours prior to first CHG shower. It is OK to shave your face.  Please follow these instructions carefully.   1. Shower the NIGHT BEFORE SURGERY and the MORNING OF SURGERY with CHG.   2. If you chose to wash your hair, wash your hair first as usual with your normal shampoo.  3. After you shampoo, wash your face and private area with the soap you use at home, then rinse  your hair and body thoroughly to remove the shampoo and soap.  4. Use CHG as you would any other liquid soap. You can apply CHG directly to the skin and wash gently with a scrungie or a clean washcloth.   5. Apply the CHG Soap to your body ONLY FROM THE NECK DOWN.  Do not use on open wounds or open sores. Avoid contact with your eyes, ears, mouth and genitals (private parts).  6. Wash thoroughly, paying special attention to the area where your surgery will be performed.  7. Thoroughly rinse your body with warm water from the neck down.  8. DO NOT shower/wash with your normal soap after using and rinsing off the CHG Soap.  9. Pat yourself dry with a CLEAN TOWEL.  10. Wear CLEAN PAJAMAS to bed the night before surgery, wear comfortable clothes the morning of surgery  11. Place CLEAN SHEETS on your bed the night of your first shower and DO NOT SLEEP WITH PETS.   Day of Surgery:  Shower as instructed above.  Do not wear lotions, powders, cologne, or deodorant.  Please wear clean clothes to the hospital/surgery center.    Remember to brush your teeth WITH YOUR REGULAR TOOTHPASTE.  Do not wear jewelry, make-up or nail polish.              Men may shave face and neck.  Do not bring valuables to the hospital.  [facility] is not responsible for any belongings or valuables.  Contacts, dentures or bridgework may not be worn into surgery.  Leave your suitcase in the car.  After surgery it may be brought to your room.  For patients admitted to the hospital, discharge time will be determined by your treatment team.  Patients discharged the day of surgery will not be allowed to drive home.   Please read over the fact sheets that you were given.

## 2020-02-16 NOTE — Anesthesia Preprocedure Evaluation (Addendum)
Anesthesia Evaluation  Patient identified by MRN, date of birth, ID band Patient awake    Reviewed: Allergy & Precautions, NPO status , Patient's Chart, lab work & pertinent test results  Airway Mallampati: II  TM Distance: >3 FB Neck ROM: Full    Dental  (+) Dental Advisory Given, Missing   Pulmonary sleep apnea ,    Pulmonary exam normal breath sounds clear to auscultation       Cardiovascular hypertension, Pt. on medications + Peripheral Vascular Disease (Aortic aneurysm) and + DVT  Normal cardiovascular exam Rhythm:Regular Rate:Normal     Neuro/Psych PSYCHIATRIC DISORDERS Anxiety Depression negative neurological ROS     GI/Hepatic Neg liver ROS, GERD  Medicated,  Endo/Other  negative endocrine ROS  Renal/GU Renal InsufficiencyRenal disease     Musculoskeletal negative musculoskeletal ROS (+)   Abdominal   Peds  Hematology  (+) Blood dyscrasia (Xarelto), ,   Anesthesia Other Findings   Reproductive/Obstetrics                           Anesthesia Physical Anesthesia Plan  ASA: III  Anesthesia Plan: General   Post-op Pain Management:    Induction: Intravenous  PONV Risk Score and Plan: 3 and Dexamethasone, Ondansetron and Treatment may vary due to age or medical condition  Airway Management Planned: Oral ETT  Additional Equipment:   Intra-op Plan:   Post-operative Plan: Extubation in OR  Informed Consent: I have reviewed the patients History and Physical, chart, labs and discussed the procedure including the risks, benefits and alternatives for the proposed anesthesia with the patient or authorized representative who has indicated his/her understanding and acceptance.     Dental advisory given  Plan Discussed with: CRNA  Anesthesia Plan Comments: (See APP note by Durel Salts, FNP)      Anesthesia Quick Evaluation

## 2020-02-16 NOTE — Progress Notes (Signed)
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 Last office visit 11/18/19   LABS: Labs reviewed: Acceptable for surgery. (all labs ordered are listed, but only abnormal results are displayed)  Labs Reviewed  BASIC METABOLIC PANEL - Abnormal; Notable for the following components:      Result Value   Creatinine, Ser 1.56 (*)    GFR calc non Af Amer 43 (*)    GFR calc Af Amer 50 (*)    All other components within normal limits  CBC     IMAGES:  MR angio chest 06/10/19:  - Stable fusiform aneurysm of the ascending thoracic aorta measuring up to 4.1 cm. No evidence for an aortic dissection. Recommend annual imaging followup by CTA or MRA  EKG 12/09/19: Sinus bradycardia with 1st degree A-V block. Minimal voltage criteria for LVH, may be normal variant ( R in aVL)   CV:  CT coronary fractional flow 11/17/19:  - CT FFR analysis didn't show any significant stenosis. -  Aggressive risk factor modification is recommended.   CT coronary morphology 11/17/19:  1. Coronary calcium score of 329. This was 46 percentile for age and sex matched control. 2. Normal coronary origin with right dominance. 3. Mild ascending aortic aneurysm with maximum diameter 42 mm. This is stable from the prior chest CTA on 05/15/2018. 4. CAD-RADS 3.  At least moderate stenosis in the ostial/proximal LAD and ostial RCA, both highly calcified. Additional analysis with CT FFR will be submitted. Consider symptom-guided anti-ischemic pharmacotherapy as well as risk factor modification per guideline directed care. 5.  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   Echo 10/23/19:  1. LVEF is 55 to 60%. LV normal function. LV has no regional wall motion abnormalities. Left ventricular diastolic parameters are consistent with Grade I diastolic dysfunction (impaired relaxation).  2. RV systolic function is normal. RV size is normal. Tricuspid regurgitation signal is inadequate for assessing PA pressure.  3. The mitral valve is normal in structure and function. Trivial mitral  valve regurgitation. No evidence of mitral stenosis.  4. The aortic valve is tricuspid. Aortic valve regurgitation is mild to  moderate. No aortic stenosis is present.  5. Aortic dilatation noted. 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 This has not significantly changed from prior echocardiogram, however does vary from cross sectional imaging measurements. Consider gated CTA aorta to verify measurements.  Nuclear stress test 04/05/16:   Nuclear stress EF: 55%.  Blood pressure demonstrated a hypertensive response to exercise.  There was no ST segment deviation noted during stress.  No T wave inversion was noted during stress.  The study  is normal.  This is a low risk study.  The left ventricular ejection fraction is normal (55-65%).   Past Medical History:  Diagnosis Date  . Anxiety   . Aortic aneurysm ([place])    followed by Dr Johnsie Cancel  . Atypical chest pain   . BPH (benign prostatic hypertrophy)   . Chronic back pain   . Depression   . DVT (deep venous thrombosis) ([place])   . GERD (gastroesophageal reflux disease)   . History of kidney stones   . HTN (hypertension)   . Hyperlipemia   .  Hypogonadism in male   . Insomnia   . Localized edema   . PE (pulmonary embolism)   . Renal insufficiency   . Restless leg syndrome   . Sleep apnea    severe OSA-cannot tolerate CPAP  . SOB (shortness of breath)   . Testicular hypofunction     Past Surgical History:  Procedure Laterality Date  . DRUG INDUCED ENDOSCOPY N/A 12/09/2019   Procedure: DRUG INDUCED ENDOSCOPY;  Surgeon: Melida Quitter, MD;  Location: [place];  Service: ENT;  Laterality: N/A;  . INGUINAL HERNIA REPAIR    . KIDNEY STONE SURGERY     stent  . ROTATOR CUFF REPAIR Right     MEDICATIONS: . acetaminophen (TYLENOL) 500 MG tablet  . Ascorbic Acid (VITAMIN C PO)  . B Complex Vitamins (VITAMIN B COMPLEX PO)  . Carboxymethylcellul-Glycerin (LUBRICATING EYE DROPS OP)  . diclofenac Sodium (VOLTAREN) 1 % GEL  . docusate sodium (COLACE) 100 MG capsule  . fenofibrate (TRICOR) 145 MG tablet  . gabapentin (NEURONTIN) 300 MG capsule  . irbesartan (AVAPRO) 150 MG tablet  . Multiple Vitamins-Minerals (ONE-A-DAY MENS 50+ ADVANTAGE) TABS  . nitroGLYCERIN (NITROSTAT) 0.4 MG SL tablet  . omeprazole (PRILOSEC) 40 MG capsule  . OVER THE COUNTER MEDICATION  . rivaroxaban (XARELTO) 20 MG TABS tablet  . rOPINIRole (REQUIP) 2 MG tablet  . sertraline (ZOLOFT) 100 MG tablet  . simvastatin (ZOCOR) 40 MG tablet  . Specialty Vitamins Products (ADVANCED COLLAGEN PO)  . tamsulosin (FLOMAX) 0.4 MG CAPS capsule  . Testosterone Cypionate 200 MG/ML SOLN  . tiZANidine (ZANAFLEX) 2 MG tablet  . VITAMIN D PO  . zolpidem (AMBIEN) 10 MG tablet   No current facility-administered medications for this encounter.   - Pt stopped xarelto 02/12/20   If no changes, I anticipate pt can proceed with surgery as scheduled.   Willeen Cass, FNP-BC [facility] Short Stay Surgical Center/Anesthesiology Phone: [phone] 02/16/2020 10:01 AM

## 2020-02-17 ENCOUNTER — Ambulatory Visit (HOSPITAL_COMMUNITY): Payer: Medicare Other

## 2020-02-17 ENCOUNTER — Ambulatory Visit (HOSPITAL_COMMUNITY): Payer: Medicare Other | Admitting: Emergency Medicine

## 2020-02-17 ENCOUNTER — Encounter (HOSPITAL_COMMUNITY)
Admission: RE | Disposition: A | Discharge status: Home / Self Care | Payer: Self-pay | Source: Home / Self Care | Attending: Otolaryngology

## 2020-02-17 ENCOUNTER — Encounter (HOSPITAL_COMMUNITY): Payer: Self-pay | Admitting: Otolaryngology

## 2020-02-17 ENCOUNTER — Ambulatory Visit (HOSPITAL_COMMUNITY)
Admission: RE | Admit: 2020-02-17 | Discharge: 2020-02-17 | Disposition: A | Discharge status: Home / Self Care | Payer: Medicare Other | Attending: Otolaryngology | Admitting: Otolaryngology

## 2020-02-17 ENCOUNTER — Other Ambulatory Visit: Payer: Self-pay

## 2020-02-17 ENCOUNTER — Ambulatory Visit (HOSPITAL_COMMUNITY): Payer: Medicare Other | Admitting: Anesthesiology

## 2020-02-17 DIAGNOSIS — G2581 Restless legs syndrome: Secondary | ICD-10-CM | POA: Insufficient documentation

## 2020-02-17 DIAGNOSIS — K219 Gastro-esophageal reflux disease without esophagitis: Secondary | ICD-10-CM | POA: Diagnosis not present

## 2020-02-17 DIAGNOSIS — E785 Hyperlipidemia, unspecified: Secondary | ICD-10-CM | POA: Insufficient documentation

## 2020-02-17 DIAGNOSIS — I10 Essential (primary) hypertension: Secondary | ICD-10-CM | POA: Insufficient documentation

## 2020-02-17 DIAGNOSIS — Z7901 Long term (current) use of anticoagulants: Secondary | ICD-10-CM | POA: Diagnosis not present

## 2020-02-17 DIAGNOSIS — Z86711 Personal history of pulmonary embolism: Secondary | ICD-10-CM | POA: Diagnosis not present

## 2020-02-17 DIAGNOSIS — Z86718 Personal history of other venous thrombosis and embolism: Secondary | ICD-10-CM | POA: Insufficient documentation

## 2020-02-17 DIAGNOSIS — I739 Peripheral vascular disease, unspecified: Secondary | ICD-10-CM | POA: Insufficient documentation

## 2020-02-17 DIAGNOSIS — E663 Overweight: Secondary | ICD-10-CM | POA: Diagnosis not present

## 2020-02-17 DIAGNOSIS — Z888 Allergy status to other drugs, medicaments and biological substances status: Secondary | ICD-10-CM | POA: Insufficient documentation

## 2020-02-17 DIAGNOSIS — Z882 Allergy status to sulfonamides status: Secondary | ICD-10-CM | POA: Insufficient documentation

## 2020-02-17 DIAGNOSIS — Z6828 Body mass index (BMI) 28.0-28.9, adult: Secondary | ICD-10-CM | POA: Insufficient documentation

## 2020-02-17 DIAGNOSIS — F419 Anxiety disorder, unspecified: Secondary | ICD-10-CM | POA: Diagnosis not present

## 2020-02-17 DIAGNOSIS — G4733 Obstructive sleep apnea (adult) (pediatric): Secondary | ICD-10-CM | POA: Insufficient documentation

## 2020-02-17 DIAGNOSIS — Z79899 Other long term (current) drug therapy: Secondary | ICD-10-CM | POA: Diagnosis not present

## 2020-02-17 DIAGNOSIS — N400 Enlarged prostate without lower urinary tract symptoms: Secondary | ICD-10-CM | POA: Insufficient documentation

## 2020-02-17 DIAGNOSIS — F329 Major depressive disorder, single episode, unspecified: Secondary | ICD-10-CM | POA: Diagnosis not present

## 2020-02-17 HISTORY — PX: IMPLANTATION OF HYPOGLOSSAL NERVE STIMULATOR: SHX6827

## 2020-02-17 LAB — PROTIME-INR
INR: 1 (ref 0.8–1.2)
Prothrombin Time: 12.4 seconds (ref 11.4–15.2)

## 2020-02-17 SURGERY — INSERTION, HYPOGLOSSAL NERVE STIMULATOR
Anesthesia: General | Site: Chest | Laterality: Right

## 2020-02-17 MED ORDER — SUCCINYLCHOLINE CHLORIDE 20 MG/ML IJ SOLN
INTRAMUSCULAR | Status: DC | PRN
Start: 2020-02-17 — End: 2020-02-17
  Administered 2020-02-17: 120 mg via INTRAVENOUS

## 2020-02-17 MED ORDER — HYDROCODONE-ACETAMINOPHEN 5-325 MG PO TABS
ORAL_TABLET | ORAL | Status: AC
  Filled 2020-02-17: qty 1

## 2020-02-17 MED ORDER — CEFAZOLIN SODIUM-DEXTROSE 2-4 GM/100ML-% IV SOLN
2.0000 g | INTRAVENOUS | Status: AC
  Administered 2020-02-17: 2 g via INTRAVENOUS
  Filled 2020-02-17: qty 100

## 2020-02-17 MED ORDER — DEXAMETHASONE SODIUM PHOSPHATE 10 MG/ML IJ SOLN
INTRAMUSCULAR | Status: DC | PRN
  Administered 2020-02-17: 10 mg via INTRAVENOUS

## 2020-02-17 MED ORDER — SODIUM CHLORIDE 0.9 % IV SOLN
INTRAVENOUS | Status: AC
  Filled 2020-02-17: qty 500000

## 2020-02-17 MED ORDER — LIDOCAINE-EPINEPHRINE 1 %-1:100000 IJ SOLN
INTRAMUSCULAR | Status: DC | PRN
  Administered 2020-02-17: 20 mL

## 2020-02-17 MED ORDER — PROPOFOL 10 MG/ML IV BOLUS
INTRAVENOUS | Status: DC | PRN
  Administered 2020-02-17: 100 mg via INTRAVENOUS
  Administered 2020-02-17: 20 mg via INTRAVENOUS
  Administered 2020-02-17: 30 mg via INTRAVENOUS
  Administered 2020-02-17: 50 mg via INTRAVENOUS

## 2020-02-17 MED ORDER — HYDROCODONE-ACETAMINOPHEN 5-325 MG PO TABS
1.0000 | ORAL_TABLET | Freq: Once | ORAL | Status: AC
  Administered 2020-02-17: 1 via ORAL

## 2020-02-17 MED ORDER — ONDANSETRON HCL 4 MG/2ML IJ SOLN
INTRAMUSCULAR | Status: DC | PRN
  Administered 2020-02-17: 4 mg via INTRAVENOUS

## 2020-02-17 MED ORDER — ONDANSETRON HCL 4 MG/2ML IJ SOLN: 4.0000 mg | Freq: Once | INTRAMUSCULAR | Status: DC | PRN

## 2020-02-17 MED ORDER — FENTANYL CITRATE (PF) 100 MCG/2ML IJ SOLN
INTRAMUSCULAR | Status: AC
  Filled 2020-02-17: qty 2

## 2020-02-17 MED ORDER — EPHEDRINE SULFATE-NACL 50-0.9 MG/10ML-% IV SOSY
PREFILLED_SYRINGE | INTRAVENOUS | Status: DC | PRN
  Administered 2020-02-17 (×2): 10 mL via INTRAVENOUS
  Administered 2020-02-17: 5 mL via INTRAVENOUS
  Administered 2020-02-17: 10 mL via INTRAVENOUS

## 2020-02-17 MED ORDER — LIDOCAINE-EPINEPHRINE 1 %-1:100000 IJ SOLN
INTRAMUSCULAR | Status: AC
  Filled 2020-02-17: qty 1

## 2020-02-17 MED ORDER — ORAL CARE MOUTH RINSE: 15.0000 mL | Freq: Once | OROMUCOSAL | Status: AC

## 2020-02-17 MED ORDER — PHENYLEPHRINE HCL-NACL 10-0.9 MG/250ML-% IV SOLN
INTRAVENOUS | Status: DC | PRN
Start: 2020-02-17 — End: 2020-02-17
  Administered 2020-02-17: 25 ug/min via INTRAVENOUS

## 2020-02-17 MED ORDER — GLYCOPYRROLATE PF 0.2 MG/ML IJ SOSY
PREFILLED_SYRINGE | INTRAMUSCULAR | Status: DC | PRN
  Administered 2020-02-17 (×2): .1 mg via INTRAVENOUS

## 2020-02-17 MED ORDER — PROPOFOL 10 MG/ML IV BOLUS
INTRAVENOUS | Status: AC
  Filled 2020-02-17: qty 40

## 2020-02-17 MED ORDER — 0.9 % SODIUM CHLORIDE (POUR BTL) OPTIME
TOPICAL | Status: DC | PRN
  Administered 2020-02-17: 1000 mL

## 2020-02-17 MED ORDER — FENTANYL CITRATE (PF) 100 MCG/2ML IJ SOLN
INTRAMUSCULAR | Status: DC | PRN
  Administered 2020-02-17: 50 ug via INTRAVENOUS
  Administered 2020-02-17: 25 ug via INTRAVENOUS
  Administered 2020-02-17: 50 ug via INTRAVENOUS
  Administered 2020-02-17 (×2): 25 ug via INTRAVENOUS
  Administered 2020-02-17 (×2): 50 ug via INTRAVENOUS
  Administered 2020-02-17: 100 ug via INTRAVENOUS
  Administered 2020-02-17: 50 ug via INTRAVENOUS

## 2020-02-17 MED ORDER — FENTANYL CITRATE (PF) 250 MCG/5ML IJ SOLN
INTRAMUSCULAR | Status: AC
  Filled 2020-02-17: qty 5

## 2020-02-17 MED ORDER — LACTATED RINGERS IV SOLN: INTRAVENOUS | Status: DC | PRN

## 2020-02-17 MED ORDER — ACETAMINOPHEN 500 MG PO TABS
1000.0000 mg | ORAL_TABLET | Freq: Once | ORAL | Status: AC
  Administered 2020-02-17: 1000 mg via ORAL
  Filled 2020-02-17: qty 2

## 2020-02-17 MED ORDER — SODIUM CHLORIDE 0.9 % IV SOLN
INTRAVENOUS | Status: DC | PRN
  Administered 2020-02-17: 500 mL

## 2020-02-17 MED ORDER — STERILE WATER FOR IRRIGATION IR SOLN
Status: DC | PRN
  Administered 2020-02-17: 1000 mL

## 2020-02-17 MED ORDER — LIDOCAINE 2% (20 MG/ML) 5 ML SYRINGE
INTRAMUSCULAR | Status: DC | PRN
  Administered 2020-02-17: 60 mg via INTRAVENOUS

## 2020-02-17 MED ORDER — HYDROCODONE-ACETAMINOPHEN 5-325 MG PO TABS: 1.0000 | ORAL_TABLET | Freq: Four times a day (QID) | ORAL | 0 refills | Status: DC | PRN

## 2020-02-17 MED ORDER — FENTANYL CITRATE (PF) 100 MCG/2ML IJ SOLN
25.0000 ug | INTRAMUSCULAR | Status: DC | PRN
  Administered 2020-02-17: 25 ug via INTRAVENOUS

## 2020-02-17 MED ORDER — CHLORHEXIDINE GLUCONATE 0.12 % MT SOLN
15.0000 mL | Freq: Once | OROMUCOSAL | Status: AC
  Administered 2020-02-17: 15 mL via OROMUCOSAL
  Filled 2020-02-17: qty 15

## 2020-02-17 SURGICAL SUPPLY — 66 items
BAG DECANTER FOR FLEXI CONT (MISCELLANEOUS) ×3 IMPLANT
BLADE CLIPPER SURG (BLADE) IMPLANT
BLADE SURG 15 STRL LF DISP TIS (BLADE) ×3 IMPLANT
BLADE SURG 15 STRL SS (BLADE) ×6
CANISTER SUCT 3000ML PPV (MISCELLANEOUS) ×3 IMPLANT
CORD BIPOLAR FORCEPS 12FT (ELECTRODE) ×3 IMPLANT
COVER PROBE W GEL 5X96 (DRAPES) ×3 IMPLANT
COVER SURGICAL LIGHT HANDLE (MISCELLANEOUS) ×3 IMPLANT
COVER WAND RF STERILE (DRAPES) ×3 IMPLANT
DERMABOND ADVANCED (GAUZE/BANDAGES/DRESSINGS) ×2
DERMABOND ADVANCED .7 DNX12 (GAUZE/BANDAGES/DRESSINGS) ×1 IMPLANT
DRAPE C-ARM 35X43 STRL (DRAPES) ×3 IMPLANT
DRAPE HEAD BAR (DRAPES) ×3 IMPLANT
DRAPE INCISE IOBAN 66X45 STRL (DRAPES) ×3 IMPLANT
DRAPE MICROSCOPE LEICA 54X105 (DRAPE) ×3 IMPLANT
DRAPE UTILITY XL STRL (DRAPES) IMPLANT
DRSG TEGADERM 4X4.75 (GAUZE/BANDAGES/DRESSINGS) ×9 IMPLANT
ELECT COATED BLADE 2.86 ST (ELECTRODE) ×3 IMPLANT
ELECT EMG 18 NIMS (NEUROSURGERY SUPPLIES) ×3
ELECT REM PT RETURN 9FT ADLT (ELECTROSURGICAL) ×3
ELECTRODE EMG 18 NIMS (NEUROSURGERY SUPPLIES) ×1 IMPLANT
ELECTRODE REM PT RTRN 9FT ADLT (ELECTROSURGICAL) ×1 IMPLANT
FORCEPS BIPOLAR SPETZLER 8 1.0 (NEUROSURGERY SUPPLIES) ×3 IMPLANT
GAUZE 4X4 16PLY RFD (DISPOSABLE) ×3 IMPLANT
GAUZE SPONGE 4X4 12PLY STRL (GAUZE/BANDAGES/DRESSINGS) ×3 IMPLANT
GENERATOR PULSE INSPIRE (Generator) ×3 IMPLANT
GLOVE BIO SURGEON STRL SZ 6.5 (GLOVE) IMPLANT
GLOVE BIO SURGEON STRL SZ7.5 (GLOVE) ×3 IMPLANT
GLOVE BIO SURGEONS STRL SZ 6.5 (GLOVE)
GOWN STRL REUS W/ TWL LRG LVL3 (GOWN DISPOSABLE) ×3 IMPLANT
GOWN STRL REUS W/TWL LRG LVL3 (GOWN DISPOSABLE) ×6
KIT BASIN OR (CUSTOM PROCEDURE TRAY) ×3 IMPLANT
KIT NEUROSTIMULATOR ACCESSORY (KITS) IMPLANT
KIT TURNOVER KIT B (KITS) ×3 IMPLANT
LEAD SENSING RESP INSPIRE (Lead) ×3 IMPLANT
LEAD SLEEP STIMULATION INSPIRE (Lead) ×3 IMPLANT
LOOP VESSEL MAXI BLUE (MISCELLANEOUS) ×3 IMPLANT
LOOP VESSEL MINI RED (MISCELLANEOUS) ×3 IMPLANT
MARKER SKIN DUAL TIP RULER LAB (MISCELLANEOUS) ×6 IMPLANT
NEEDLE HYPO 25GX1X1/2 BEV (NEEDLE) ×3 IMPLANT
NS IRRIG 1000ML POUR BTL (IV SOLUTION) ×3 IMPLANT
PAD ARMBOARD 7.5X6 YLW CONV (MISCELLANEOUS) ×3 IMPLANT
PASSER CATH 38CM DISP (INSTRUMENTS) ×3 IMPLANT
PENCIL SMOKE EVACUATOR (MISCELLANEOUS) ×3 IMPLANT
POSITIONER HEAD DONUT 9IN (MISCELLANEOUS) ×3 IMPLANT
PROBE NERVE STIMULATOR (NEUROSURGERY SUPPLIES) ×3 IMPLANT
REMOTE CONTROL SLEEP INSPIRE (MISCELLANEOUS) ×3 IMPLANT
SET WALTER ACTIVATION W/DRAPE (SET/KITS/TRAYS/PACK) ×3 IMPLANT
SLING ARM FOAM STRAP LRG (SOFTGOODS) IMPLANT
SLING ARM FOAM STRAP MED (SOFTGOODS) IMPLANT
SPONGE INTESTINAL PEANUT (DISPOSABLE) ×3 IMPLANT
STAPLER VISISTAT 35W (STAPLE) ×3 IMPLANT
SUT SILK 2 0 SH (SUTURE) ×3 IMPLANT
SUT SILK 3 0 REEL (SUTURE) ×3 IMPLANT
SUT SILK 3 0 SH 30 (SUTURE) ×6 IMPLANT
SUT SILK 3-0 (SUTURE) ×2
SUT SILK 3-0 RB1 30XBRD (SUTURE) ×1
SUT VIC AB 3-0 SH 27 (SUTURE) ×2
SUT VIC AB 3-0 SH 27X BRD (SUTURE) ×1 IMPLANT
SUT VIC AB 4-0 PS2 27 (SUTURE) ×6 IMPLANT
SUTURE SILK 3-0 RB1 30XBRD (SUTURE) ×1 IMPLANT
SYR 10ML LL (SYRINGE) ×3 IMPLANT
TAPE CLOTH SOFT 2X10 (GAUZE/BANDAGES/DRESSINGS) ×3 IMPLANT
TAPE CLOTH SURG 4X10 WHT LF (GAUZE/BANDAGES/DRESSINGS) ×3 IMPLANT
TOWEL GREEN STERILE (TOWEL DISPOSABLE) ×3 IMPLANT
TRAY ENT MC OR (CUSTOM PROCEDURE TRAY) ×3 IMPLANT

## 2020-02-17 NOTE — Op Note (Signed)

## 2020-02-17 NOTE — Brief Op Note (Signed)
02/17/2020  10:14 AM  PATIENT:  Adrian Franco  74 y.o. male  PRE-OPERATIVE DIAGNOSIS:  obstructive sleep apnea  POST-OPERATIVE DIAGNOSIS:  obstructive sleep apnea  PROCEDURE:  Procedure(s): IMPLANTATION OF HYPOGLOSSAL NERVE STIMULATOR (Right)  SURGEON:  Surgeon(s) and Role:    Melida Quitter, MD - Primary  PHYSICIAN ASSISTANT: Sallee Provencal  ASSISTANTS: none   ANESTHESIA:   general  EBL:  20 mL   BLOOD ADMINISTERED:none  DRAINS: none   LOCAL MEDICATIONS USED:  NONE  SPECIMEN:  No Specimen  DISPOSITION OF SPECIMEN:  N/A  COUNTS:  YES  TOURNIQUET:  * No tourniquets in log *  DICTATION: .Note written in EPIC  PLAN OF CARE: Discharge to home after PACU  PATIENT DISPOSITION:  PACU - hemodynamically stable.   Delay start of Pharmacological VTE agent (>24hrs) due to surgical blood loss or risk of bleeding: no

## 2020-02-17 NOTE — Anesthesia Procedure Notes (Signed)
Procedure Name: Intubation Date/Time: 02/17/2020 7:40 AM Performed by: Lance Coon, CRNA Pre-anesthesia Checklist: Patient identified, Emergency Drugs available, Suction available and Patient being monitored Patient Re-evaluated:Patient Re-evaluated prior to induction Oxygen Delivery Method: Circle System Utilized Preoxygenation: Pre-oxygenation with 100% oxygen Induction Type: IV induction Ventilation: Mask ventilation without difficulty Laryngoscope Size: Miller and 2 Grade View: Grade I Tube type: Oral Tube size: 7.5 mm Number of attempts: 1 Airway Equipment and Method: Stylet and Oral airway Placement Confirmation: ETT inserted through vocal cords under direct vision,  positive ETCO2 and breath sounds checked- equal and bilateral Secured at: 22 cm Tube secured with: Tape Dental Injury: Teeth and Oropharynx as per pre-operative assessment  Comments: Placed by D. [place], [place]

## 2020-02-17 NOTE — H&P (Signed)
Adrian Franco is an 74 y.o. male.   Chief Complaint: OSA HPI: 74 year old male with OSA who has been unable to tolerate CPAP.  He presents for [facility] placement.  Past Medical History:  Diagnosis Date  . Anxiety   . Aortic aneurysm ([place])    followed by Dr Johnsie Cancel  . Atypical chest pain   . BPH (benign prostatic hypertrophy)   . Chronic back pain   . Depression   . DVT (deep venous thrombosis) ([place])   . GERD (gastroesophageal reflux disease)   . History of kidney stones   . HTN (hypertension)   . Hyperlipemia   . Hypogonadism in male   . Insomnia   . Localized edema   . PE (pulmonary embolism)   . Renal insufficiency   . Restless leg syndrome   . Sleep apnea    severe OSA-cannot tolerate CPAP  . SOB (shortness of breath)   . Testicular hypofunction     Past Surgical History:  Procedure Laterality Date  . DRUG INDUCED ENDOSCOPY N/A 12/09/2019   Procedure: DRUG INDUCED ENDOSCOPY;  Surgeon: Melida Quitter, MD;  Location: [place];  Service: ENT;  Laterality: N/A;  . INGUINAL HERNIA REPAIR    . KIDNEY STONE SURGERY     stent  . ROTATOR CUFF REPAIR Right     Family History  Problem Relation Age of Onset  . Hyperlipidemia Mother   . Hyperlipidemia Father   . Heart attack Father   . Healthy Son   . Healthy Brother   . Other Brother   . Healthy Sister   . Healthy Son    Social History:  reports that he has never smoked. He has never used smokeless tobacco. He reports that he does not drink alcohol and does not use drugs.  Allergies:  Allergies  Allergen Reactions  . Sulfa Antibiotics Itching  . Amoxapine And Related Hives and Rash  . Lotensin [Benazepril Hcl] Hives and Rash    Medications Prior to Admission  Medication Sig Dispense Refill  . acetaminophen (TYLENOL) 500 MG tablet Take 1,000 mg by mouth every 6 (six) hours as needed for mild pain or moderate pain.     . Ascorbic Acid (VITAMIN C PO) Take 1 tablet by mouth daily.    . B Complex  Vitamins (VITAMIN B COMPLEX PO) Take 1 tablet by mouth daily.    . Carboxymethylcellul-Glycerin (LUBRICATING EYE DROPS OP) Place 1 drop into both eyes daily as needed (dry eyes).    Marland Kitchen diclofenac Sodium (VOLTAREN) 1 % GEL Apply 1 application topically 4 (four) times daily as needed (pain).    Marland Kitchen docusate sodium (COLACE) 100 MG capsule Take 100 mg by mouth daily.     . fenofibrate (TRICOR) 145 MG tablet Take 145 mg by mouth daily.    Marland Kitchen gabapentin (NEURONTIN) 300 MG capsule Take 300 mg by mouth at bedtime as needed (pain).     . irbesartan (AVAPRO) 150 MG tablet Take 150 mg by mouth daily.    . Multiple Vitamins-Minerals (ONE-A-DAY MENS 50+ ADVANTAGE) TABS Take 1 tablet by mouth daily with breakfast.    . nitroGLYCERIN (NITROSTAT) 0.4 MG SL tablet Place 0.4 mg under the tongue every 5 (five) minutes as needed for chest pain.     Marland Kitchen omeprazole (PRILOSEC) 40 MG capsule Take 40 mg by mouth every evening.     Marland Kitchen OVER THE COUNTER MEDICATION Take 2 tablets by mouth at bedtime. Cramp Defence otc supplement    .  rivaroxaban (XARELTO) 20 MG TABS tablet Take 1 tablet (20 mg total) by mouth daily with supper. 90 tablet 3  . rOPINIRole (REQUIP) 2 MG tablet Take 2 mg by mouth at bedtime.     . sertraline (ZOLOFT) 100 MG tablet Take 50 mg by mouth daily.     . simvastatin (ZOCOR) 40 MG tablet Take 1 tablet (40 mg total) by mouth daily. 90 tablet 3  . Specialty Vitamins Products (ADVANCED COLLAGEN PO) Take 1 tablet by mouth daily.    . tamsulosin (FLOMAX) 0.4 MG CAPS capsule Take 0.4 mg by mouth daily.     . Testosterone Cypionate 200 MG/ML SOLN Inject 200 mg as directed every 14 (fourteen) days.     Marland Kitchen tiZANidine (ZANAFLEX) 2 MG tablet Take 2 mg by mouth at bedtime as needed for muscle spasms.     Marland Kitchen VITAMIN D PO Take 1 tablet by mouth daily.    Marland Kitchen zolpidem (AMBIEN) 10 MG tablet Take 10 mg by mouth at bedtime.       Results for orders placed or performed during the hospital encounter of 02/15/20 (from the past 48  hour(s))  Basic metabolic panel     Status: Abnormal   Collection Time: 02/15/20  2:31 PM  Result Value Ref Range   Sodium 140 135 - 145 mmol/L   Potassium 4.4 3.5 - 5.1 mmol/L   Chloride 106 98 - 111 mmol/L   CO2 26 22 - 32 mmol/L   Glucose, Bld 88 70 - 99 mg/dL    Comment: Glucose reference range applies only to samples taken after fasting for at least 8 hours.   BUN 16 8 - 23 mg/dL   Creatinine, Ser 1.56 (H) 0.61 - 1.24 mg/dL   Calcium 9.2 8.9 - 10.3 mg/dL   GFR calc non Af Amer 43 (L) >60 mL/min   GFR calc Af Amer 50 (L) >60 mL/min   Anion gap 8 5 - 15    Comment: Performed at [facility] [address] [place] [address]  CBC     Status: None   Collection Time: 02/15/20  2:31 PM  Result Value Ref Range   WBC 5.1 4.0 - 10.5 K/uL   RBC 4.58 4.22 - 5.81 MIL/uL   Hemoglobin 13.9 13.0 - 17.0 g/dL   HCT 42.9 39 - 52 %   MCV 93.7 80.0 - 100.0 fL   MCH 30.3 26.0 - 34.0 pg   MCHC 32.4 30.0 - 36.0 g/dL   RDW 11.9 11.5 - 15.5 %   Platelets 251 150 - 400 K/uL   nRBC 0.0 0.0 - 0.2 %    Comment: Performed at [facility], [place] [address], [place], [place] [address]   No results found.  Review of Systems  All other systems reviewed and are negative.   Blood pressure (!) 143/72, pulse (!) 58, temperature (!) 97.3 F (36.3 C), temperature source Tympanic, resp. rate 17, height 5\' 8"  (1.727 m), weight 86.2 kg, SpO2 98 %. Physical Exam  HENT:  Head: Normocephalic and atraumatic.  Right Ear: External ear normal.  Left Ear: External ear normal.  Nose: Nose normal.  Mouth/Throat: Mucous membranes are moist. Oropharynx is clear.  Eyes: Pupils are equal, round, and reactive to light. Conjunctivae are normal.  Cardiovascular: Normal rate.  Respiratory: Effort normal.  GI: Normal appearance.  Musculoskeletal:     Cervical back: Normal range of motion.  Neurological: He is alert.  Skin: Skin is warm  and dry.  Psychiatric: His behavior is normal. Mood, judgment  and thought content normal.     Assessment/Plan OSA  To OR for Inspire placement.  Melida Quitter, MD 02/17/2020, 7:28 AM

## 2020-02-17 NOTE — Transfer of Care (Signed)
Immediate Anesthesia Transfer of Care Note  Patient: Adrian Franco  Procedure(s) Performed: IMPLANTATION OF HYPOGLOSSAL NERVE STIMULATOR (Right Chest)  Patient Location: PACU  Anesthesia Type:General  Level of Consciousness: drowsy and patient cooperative  Airway & Oxygen Therapy: Patient Spontanous Breathing  Post-op Assessment: Report given to RN and Post -op Vital signs reviewed and stable  Post vital signs: Reviewed and stable  Last Vitals:  Vitals Value Taken Time  BP    Temp    Pulse 72 02/17/20 1018  Resp 19 02/17/20 1018  SpO2 85 % 02/17/20 1018  Vitals shown include unvalidated device data.  Last Pain:  Vitals:   02/17/20 0644  TempSrc:   PainSc: 0-No pain         Complications: No complications documented.

## 2020-02-18 ENCOUNTER — Encounter (HOSPITAL_COMMUNITY): Payer: Self-pay | Admitting: Otolaryngology

## 2020-02-18 NOTE — Anesthesia Postprocedure Evaluation (Signed)
Anesthesia Post Note  Patient: Adrian Franco  Procedure(s) Performed: IMPLANTATION OF HYPOGLOSSAL NERVE STIMULATOR (Right Chest)     Patient location during evaluation: PACU Anesthesia Type: General Level of consciousness: awake and alert Pain management: pain level controlled Vital Signs Assessment: post-procedure vital signs reviewed and stable Respiratory status: spontaneous breathing, nonlabored ventilation, respiratory function stable and patient connected to nasal cannula oxygen Cardiovascular status: blood pressure returned to baseline and stable Postop Assessment: no apparent nausea or vomiting Anesthetic complications: no   No complications documented.  Last Vitals:  Vitals:   02/17/20 1045 02/17/20 1100  BP: (!) 164/79 (!) 160/78  Pulse: 66 74  Resp: 17 16  Temp:  36.6 C  SpO2: 95% 100%    Last Pain:  Vitals:   02/17/20 1100  TempSrc:   PainSc: [place]

## 2020-03-22 ENCOUNTER — Institutional Professional Consult (permissible substitution): Payer: Medicare Other | Admitting: Neurology

## 2020-03-31 ENCOUNTER — Encounter: Payer: Self-pay | Admitting: Neurology

## 2020-03-31 ENCOUNTER — Ambulatory Visit (INDEPENDENT_AMBULATORY_CARE_PROVIDER_SITE_OTHER): Payer: Medicare Other | Admitting: Neurology

## 2020-03-31 VITALS — BP 117/68 | HR 69 | Ht 68.0 in | Wt 192.0 lb

## 2020-03-31 DIAGNOSIS — Z789 Other specified health status: Secondary | ICD-10-CM

## 2020-03-31 DIAGNOSIS — G4733 Obstructive sleep apnea (adult) (pediatric): Secondary | ICD-10-CM

## 2020-03-31 NOTE — Progress Notes (Signed)
SLEEP MEDICINE CLINIC    Provider:  Larey Seat, MD  Primary Care Physician:  [name], [place] [address]     Referring Provider: Dr. Redmond Baseman, MD ENT- [facility]           Chief Complaint according to patient   Patient presents with:    . New Patient (Initial Visit)           03-31-2020 activation visit for INSPIRE-   [name], born on 29-Sep-1945 presented today for inspire therapy session and activation following implantation.  The amplitude was now set from 0 to 0.6 V, patient control will be between 0.6 and 1.6 V pulse width and microseconds state at 90 s before this visit and after the rate of stimulation and hertz remained at 33 Hz before and after pause time at 15 minutes start delay at 30 minutes.  Therapy duration was set from 8 to 9 hours nocturnal stimulation with automatic check of based on the patient's reported sleep time.  Sensing for exhalation will be 7 minutes for 2-1 and has stayed at this level inhalation from zero to +1 and has stayed at this level.  Off.  38% alternating with thirteen has stayed at this level maximum stimulation time in seconds is 4 seconds and has remained there is no inversion.  Initial activation and stimulation settings have changed for amplitude, patient control and therapy duration only tongue motion was witnessed and was reported as comfortable.  The generator serial number is air [id number].  The testing of waveforms showed excellent response.  The patient will be fully titrated to inspire during a sleep study to find the goal setting. Larey Seat, MD     HISTORY OF PRESENT ILLNESS:      Adrian Franco is a 74  year old Caucasian male patient seen here  In the company of his wife. He is a Forensic psychologist. Seen upon referral on 03/31/2020 from Dr Redmond Baseman, MD .  Chief concern according to patient :   I have the pleasure of seeing Adrian Franco today, a right -handed White or  Caucasian male with a possible sleep disorder.  he   has a past medical history of Anxiety, Aortic aneurysm ([place]), Atypical chest pain, BPH (benign prostatic hypertrophy), Chronic back pain, Depression, DVT (deep venous thrombosis) ([place]), GERD (gastroesophageal reflux disease), History of kidney stones, HTN (hypertension), Hyperlipemia, Hypogonadism in male, Insomnia, Localized edema, PE (pulmonary embolism), Renal insufficiency, Restless leg syndrome, Sleep apnea, SOB (shortness of breath), and Testicular hypofunction.. He had been infected with COVID 19 in November , and recovered after 10 days of severe illness, his sense of taste and smell have returned.    The patient had the first sleep study in the year 2005, and was diagnosed with apnea.  He has been using an auto titration CPAP machine and believes the average percentile is about 10 or 11 cm pressure.  He would like to be evaluated for alternatives to CPAP use and is interested in the inspire technology.  He presented to Dr. Hessie Knows on 7 December and is described as a 74 year old male patient with a chief complaint of CPAP use for sleep apnea for the last 10 to 12 years he has noticed a benefit when using CPAP but he has never felt that the mask is fitting him well.  He has been on a full facemask which allows more air leakage  and the machine wakes him up from air leaking and he feels at times also smothered. Tried nasal mask and pillows, was snoring loudly.  His machine makes a puffing sounds.  If he can sleep with CPAP, he feels good the next day.   He didn't bring his CPAP nor his wife's to this appointment.  PS: The arousals were noted as: 75 were spontaneous, 26 were  associated with PLMs, 119 were associated with respiratory  events.  The patient had a Periodic Limb Movement (PLM) index of 44.6 and  the PLM Arousal index was 5.8/hour.  He reported leg cramping, causing prolonged wakefulness. Audio  and video analysis did not  show any abnormal or unusual  movements, behaviors, phonations or vocalizations during his  sleep.   Nocturia times one. Snoring was noted.  EKG was in keeping with sinus rhythm, but bradycardic.   IMPRESSION:  1. Given the severe degree of Obstructive Sleep Apnea (OSA) with  an AHI of 36.7/h, the associated hypoxia ( 69 minutes of total  sleep time ) and supine exacerbation to AHI of 61/h, treatment of  sleep apnea is a must.  2. 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  3. Primary Snoring.  4. Poor sleep quality and efficiency.    RECOMMENDATIONS:   1. I would usually advise a full-night, attended, PAP titration  study to optimize therapy. given that he feels better when PAP is  used, yet cannot longer tolerate CPAP, a change to inspire  therapy is indicated.  2. I will let Dr Redmond Baseman know that the indication for INSPIRE  therapy is given.     03-31-2020: Activation procedue for inspire, Implantation was on 02-17-2020. Hypoglossal CN stimulator-    Review of Systems: Out of a complete 14 system review, the patient complains of only the following symptoms, and all other reviewed systems are negative.:  Fatigue, sleepiness , snoring, fragmented sleep, nocturia - 2 times    How likely are you to doze in the following situations: 0 = not likely, 1 = slight chance, 2 = moderate chance, 3 = high chance   Sitting and Reading? Watching Television? Sitting inactive in a public place (theater or meeting)? As a passenger in a car for an hour without a break? Lying down in the afternoon when circumstances permit? Sitting and talking to someone? Sitting quietly after lunch without alcohol? In a car, while stopped for a few minutes in traffic?   Total = 7/ 24 points   FSS endorsed at 34/ 63 points.   Social History   Socioeconomic History  . Marital status: Married    Spouse name: Not  on file  . Number of children: Not on file  . Years of education: Not on file  . Highest education level: Not on file  Occupational History  . Not on file  Tobacco Use  . Smoking status: Never Smoker  . Smokeless tobacco: Never Used  Vaping Use  . Vaping Use: Never used  Substance and Sexual Activity  . Alcohol use: No    Alcohol/week: 0.0 standard drinks  . Drug use: No  . Sexual activity: Yes    Comment: married  Other Topics Concern  . Not on file  Social History Narrative  . Not on file   Social Determinants of Health   Financial Resource Strain:   . Difficulty of Paying Living Expenses:   Food Insecurity:   .  Worried About Charity fundraiser in the Last Year:   . Arboriculturist in the Last Year:   Transportation Needs:   . Film/video editor (Medical):   Marland Kitchen Lack of Transportation (Non-Medical):   Physical Activity:   . Days of Exercise per Week:   . Minutes of Exercise per Session:   Stress:   . Feeling of Stress :   Social Connections:   . Frequency of Communication with Friends and Family:   . Frequency of Social Gatherings with Friends and Family:   . Attends Religious Services:   . Active Member of Clubs or Organizations:   . Attends Archivist Meetings:   Marland Kitchen Marital Status:     Family History  Problem Relation Age of Onset  . Hyperlipidemia Mother   . Hyperlipidemia Father   . Heart attack Father   . Healthy Son   . Healthy Brother   . Other Brother   . Healthy Sister   . Healthy Son     Past Medical History:  Diagnosis Date  . Anxiety   . Aortic aneurysm ([place])    followed by Dr Johnsie Cancel  . Atypical chest pain   . BPH (benign prostatic hypertrophy)   . Chronic back pain   . Depression   . DVT (deep venous thrombosis) ([place])   . GERD (gastroesophageal reflux disease)   . History of kidney stones   . HTN (hypertension)   . Hyperlipemia   . Hypogonadism in male   . Insomnia   . Localized edema   . PE (pulmonary embolism)     . Renal insufficiency   . Restless leg syndrome   . Sleep apnea    severe OSA-cannot tolerate CPAP  . SOB (shortness of breath)   . Testicular hypofunction     Past Surgical History:  Procedure Laterality Date  . DRUG INDUCED ENDOSCOPY N/A 12/09/2019   Procedure: DRUG INDUCED ENDOSCOPY;  Surgeon: Melida Quitter, MD;  Location: [place];  Service: ENT;  Laterality: N/A;  . IMPLANTATION OF HYPOGLOSSAL NERVE STIMULATOR Right 02/17/2020   Procedure: IMPLANTATION OF HYPOGLOSSAL NERVE STIMULATOR;  Surgeon: Melida Quitter, MD;  Location: [place];  Service: ENT;  Laterality: Right;  . INGUINAL HERNIA REPAIR    . KIDNEY STONE SURGERY     stent  . ROTATOR CUFF REPAIR Right      Current Outpatient Medications on File Prior to Visit  Medication Sig Dispense Refill  . acetaminophen (TYLENOL) 500 MG tablet Take 1,000 mg by mouth every 6 (six) hours as needed for mild pain or moderate pain.     . Ascorbic Acid (VITAMIN C PO) Take 1 tablet by mouth daily.    . B Complex Vitamins (VITAMIN B COMPLEX PO) Take 1 tablet by mouth daily.    . diclofenac Sodium (VOLTAREN) 1 % GEL Apply 1 application topically 4 (four) times daily as needed (pain).    Marland Kitchen docusate sodium (COLACE) 100 MG capsule Take 100 mg by mouth daily.     . fenofibrate (TRICOR) 145 MG tablet Take 145 mg by mouth daily.    Marland Kitchen gabapentin (NEURONTIN) 300 MG capsule Take 300 mg by mouth at bedtime as needed (pain).     Marland Kitchen HYDROcodone-acetaminophen (NORCO/VICODIN) 5-325 MG tablet Take 1-2 tablets by mouth every 6 (six) hours as needed for moderate pain. 12 tablet 0  . irbesartan (AVAPRO) 150 MG tablet Take 150 mg by mouth daily.    . Multiple Vitamins-Minerals (ONE-A-DAY  MENS 50+ ADVANTAGE) TABS Take 1 tablet by mouth daily with breakfast.    . omeprazole (PRILOSEC) 40 MG capsule Take 40 mg by mouth every evening.     Marland Kitchen OVER THE COUNTER MEDICATION Take 2 tablets by mouth at bedtime. Cramp Defence otc supplement    . rivaroxaban  (XARELTO) 20 MG TABS tablet Take 1 tablet (20 mg total) by mouth daily with supper. 90 tablet 3  . rOPINIRole (REQUIP) 2 MG tablet Take 2 mg by mouth at bedtime.     . sertraline (ZOLOFT) 100 MG tablet Take 50 mg by mouth daily.     . simvastatin (ZOCOR) 40 MG tablet Take 1 tablet (40 mg total) by mouth daily. 90 tablet 3  . Specialty Vitamins Products (ADVANCED COLLAGEN PO) Take 1 tablet by mouth daily.    . tamsulosin (FLOMAX) 0.4 MG CAPS capsule Take 0.4 mg by mouth daily.     . Testosterone Cypionate 200 MG/ML SOLN Inject 200 mg as directed every 14 (fourteen) days.     Marland Kitchen tiZANidine (ZANAFLEX) 2 MG tablet Take 2 mg by mouth at bedtime as needed for muscle spasms.     Marland Kitchen VITAMIN D PO Take 1 tablet by mouth daily.    Marland Kitchen zolpidem (AMBIEN) 10 MG tablet Take 10 mg by mouth at bedtime.      No current facility-administered medications on file prior to visit.    Allergies  Allergen Reactions  . Sulfa Antibiotics Itching  . Amoxapine And Related Hives and Rash  . Lotensin [Benazepril Hcl] Hives and Rash    Physical exam:  Today's Vitals   03/31/20 0948  BP: 117/68  Pulse: 69  Weight: 192 lb (87.1 kg)  Height: 5\' 8"  (1.727 m)   Body mass index is 29.19 kg/m.   Wt Readings from Last 3 Encounters:  03/31/20 192 lb (87.1 kg)  02/17/20 190 lb (86.2 kg)  02/15/20 195 lb 14.4 oz (88.9 kg)     Ht Readings from Last 3 Encounters:  03/31/20 5\' 8"  (1.727 m)  02/17/20 5\' 8"  (1.727 m)  02/15/20 5\' 8"  (1.727 m)      General: The patient is awake, alert and appears not in acute distress. The patient is well groomed. Head: Normocephalic, atraumatic. Neck is supple. Mallampati 3  neck circumference:17 inches . Nasal airflow congested -   Retrognathia is seen.  Dental status:  Cardiovascular:  Regular rate and cardiac rhythm by pulse,  without distended neck veins. Respiratory: Lungs are clear to auscultation.  Skin:  Without evidence of ankle edema, or rash. Trunk: The patient's posture  is erect.   Neurologic exam : The patient is awake and alert, oriented to place and time.   Memory subjective described as intact.  Attention span & concentration ability appears normal.  Speech is fluent,  without  dysarthria, dysphonia or aphasia.  Mood and affect are appropriate.   Cranial nerves: no loss of smell or taste reported - recovered by December 2020 Pupils are equal and briskly reactive to light. Funduscopic exam deferred.   Extraocular movements in vertical and horizontal planes were intact and without nystagmus. No Diplopia. Visual fields by finger perimetry are intact. Hearing was intact to soft voice and finger rubbing.    Facial sensation intact to fine touch. Facial motor strength is symmetric and tongue and uvula move midline.  Neck ROM : rotation, tilt and flexion extension were normal for age and shoulder shrug was symmetrical.    Motor exam:  Symmetric bulk,  tone and ROM.   Normal tone without cog wheeling, symmetric grip strength .   Sensory:  Fine touch, pinprick and vibration were tested  and  normal.  Proprioception tested in the upper extremities was normal. Coordination: Rapid alternating movements in the fingers/hands were of normal speed.  The Finger-to-nose maneuver was intact without evidence of ataxia, dysmetria , he has bilaterally rtrremors at rest.    Gait and station: Patient could rise unassisted from a seated position, walked without assistive device.  Stance is of normal width/ base and the patient turned with 4 steps.  Toe and heel walk were deferred.  Deep tendon reflexes: in the  upper and lower extremities are symmetric and intact.  Babinski response was deferred.     After spending a total time of 25 minutes face to face and with initial stimulation settings to this device.   I appreciate Dr. Hessie Knows referral and we will keep him posted with the results of the INspire titration.    Sincerely, Anyra Kaufman MD     I plan to  follow up either personally within 2-3  month.    Electronically signed by: Larey Seat, MD 03/31/2020 10:06 AM  [facility] Board certified by The [employer] of Sleep Medicine and Diplomate of the [employer] of Sleep Medicine. Board certified In Neurology through the [place], Fellow of the [employer] of Neurology. Medical Director of [facility].

## 2020-04-07 ENCOUNTER — Telehealth: Payer: Self-pay

## 2020-04-07 DIAGNOSIS — G4733 Obstructive sleep apnea (adult) (pediatric): Secondary | ICD-10-CM

## 2020-04-07 NOTE — Telephone Encounter (Signed)
Order placed

## 2020-04-07 NOTE — Telephone Encounter (Signed)
Need sleep study order for post activation of Inspire.

## 2020-04-28 ENCOUNTER — Encounter: Payer: Self-pay | Admitting: Neurology

## 2020-05-04 ENCOUNTER — Telehealth: Payer: Self-pay

## 2020-05-04 NOTE — Telephone Encounter (Signed)
Called patient to check on how he is doing with [employer]. He is currently at level 5 and is doing great. He feels comfortable and has no tongue pain. He will continue to increase his levels slowly until his titration on 06/28/20. I told him to call me if had any questions.

## 2020-05-11 ENCOUNTER — Telehealth: Payer: Self-pay | Admitting: Nurse Practitioner

## 2020-05-11 ENCOUNTER — Other Ambulatory Visit: Payer: Self-pay | Admitting: Neurological Surgery

## 2020-05-11 DIAGNOSIS — M5416 Radiculopathy, lumbar region: Secondary | ICD-10-CM

## 2020-05-11 NOTE — Telephone Encounter (Signed)
Phone call to patient to verify medication list and allergies for myelogram procedure. Pt instructed to hold zoloft for 48hrs prior to myelogram appointment time. Pt verbalized understanding. Pre and post procedure instructions reviewed with pt.

## 2020-05-16 ENCOUNTER — Ambulatory Visit
Admission: RE | Admit: 2020-05-16 | Discharge: 2020-05-16 | Disposition: A | Discharge status: Home / Self Care | Payer: Medicare Other | Source: Ambulatory Visit | Attending: Neurological Surgery | Admitting: Neurological Surgery

## 2020-05-16 ENCOUNTER — Other Ambulatory Visit: Payer: Self-pay

## 2020-05-16 DIAGNOSIS — M5416 Radiculopathy, lumbar region: Secondary | ICD-10-CM

## 2020-05-16 MED ORDER — IOPAMIDOL (ISOVUE-M 200) INJECTION 41%
18.0000 mL | Freq: Once | INTRAMUSCULAR | Status: AC
  Administered 2020-05-16: 18 mL via INTRATHECAL

## 2020-05-16 MED ORDER — DIAZEPAM 5 MG PO TABS
5.0000 mg | ORAL_TABLET | Freq: Once | ORAL | Status: AC
  Administered 2020-05-16: 5 mg via ORAL

## 2020-05-16 NOTE — Progress Notes (Signed)
Pt reports he has been off of his Zoloft for at least 48 hours. Pt verbalized understanding that he can resume his Zoloft tomorrow at 1030 AM.

## 2020-05-16 NOTE — Discharge Instructions (Signed)

## 2020-05-18 ENCOUNTER — Telehealth: Payer: Self-pay | Admitting: Cardiovascular Disease

## 2020-05-18 DIAGNOSIS — I712 Thoracic aortic aneurysm, without rupture, unspecified: Secondary | ICD-10-CM

## 2020-05-18 NOTE — Telephone Encounter (Signed)
Patient cannot have an MRI due to implantation of device for sleep apnea done on 02/17/20. Radiology recommend changing to [place] instead since patient cannot have MR angio. Placed order for CT and lab work for [place].

## 2020-05-18 NOTE — Telephone Encounter (Signed)
Wife of the patient called. The patient needs to have an MR Angio Chest  Done. The patient tried to set up the procedure with the Hospital radiology but was told by the hospital that the patient needs [name] to provide a code because the patient has an internal device.  Please verify with wife and send codes as needed so the patient can be scheduled for the imaging study

## 2020-05-20 ENCOUNTER — Telehealth: Payer: Self-pay | Admitting: *Deleted

## 2020-05-20 ENCOUNTER — Other Ambulatory Visit: Payer: Self-pay | Admitting: Neurological Surgery

## 2020-05-20 NOTE — Telephone Encounter (Signed)
[name],    Will you please comment on [name]?  I have copied your last noted concerning holding anticoagulation for Lumbar fusion surgery on 06/10/2020.   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 Donzetta Matters called it unprovoked. I discussed with him staying on xarelto for life to prevent post phlebitic syndrome and be able to take a possibly pro thrombotic testosterone supplement Last venous duplex 10/23/19 still with chronic clot in left popliteal vein" 02/01/2020.    Please reply to pre-op pool. Thank you  Curt Bears

## 2020-05-20 NOTE — Telephone Encounter (Signed)
    [facility] Pre-operative Risk Assessment    HEARTCARE STAFF: - Please ensure there is not already an duplicate clearance open for this procedure. - Under Visit Info/Reason for Call, type in Other and utilize the format Clearance MM/DD/YY or Clearance TBD. Do not use dashes or single digits. - If request is for dental extraction, please clarify the # of teeth to be extracted.  Request for surgical clearance:  1. What type of surgery is being performed? L4-5 LUMBAR FUSION   2. When is this surgery scheduled? 06/10/20  3. What type of clearance is required (medical clearance vs. Pharmacy clearance to hold med vs. Both)? BOTH  4. Are there any medications that need to be held prior to surgery and how long? [place]  5. Practice name and name of physician performing surgery? [facility]; DR. Sherley Bounds   6. What is the office phone number? [phone]   7.   What is the office fax number? [place]: VANESSA  8.   Anesthesia type (None, local, MAC, general) ? GENERAL   Julaine Hua 05/20/2020, 2:52 PM  _________________________________________________________________   (provider comments below)

## 2020-05-24 ENCOUNTER — Telehealth: Payer: Self-pay | Admitting: Cardiovascular Disease

## 2020-05-24 NOTE — Telephone Encounter (Signed)
Patients wife would like for [name] or nurse to give her a call regarding patient's Xarelto. Please call to discuss

## 2020-05-24 NOTE — Telephone Encounter (Signed)
Patient is getting > 4 mets of activity.  Given past medical history and time since last visit, based on ACC/AHA guidelines, FRUTOSO DIMARE would be at acceptable risk for the planned procedure without further cardiovascular testing.   Dr. Donzetta Matters to gave recommendations regarding Xarelto.   [place], [place] 05/24/2020, 1:19 PM

## 2020-05-24 NOTE — Telephone Encounter (Signed)
This one is not my call Dr Adrian Franco needs to discuss regarding his DVT We tend not to use lovenox for DOAC;s Since he is having neuro surgery typically we would hold for 3 days prior

## 2020-05-24 NOTE — Telephone Encounter (Signed)
Left message for patient to call back  

## 2020-05-26 NOTE — Telephone Encounter (Signed)
   Primary Cardiologist: Jenkins Rouge, MD  Chart revisited as part of pre-operative protocol coverage. Vin Bhagat PA-C had gotten reply from Dr. Donzetta Matters regarding Xarelto to pass along. Dr. Donzetta Matters states, "From a DVT standpoint he should be able to hold his Xarelto in the perioperative period. We had discussed only continuing it with ongoing risk factor of testosterone therapy which he was unwilling to stop at the time that I evaluated him." Does not appear that neither Dr. Donzetta Matters nor [name] have been managing the Xarelto recently so would still recommend surgeon involve the primary care team managing his anticoagulation. Will fax this update. Please call with questions.   Charlie Pitter, PA-C 05/26/2020, 11:10 AM

## 2020-05-26 NOTE — Telephone Encounter (Signed)
   Primary Cardiologist: Jenkins Rouge, MD  Chart reviewed as part of pre-operative protocol coverage. As below, PA Bhagat spoke with patient and cleared him for this procedure without further cardiovascular testing.   [name] is not actively managing the patient's Xarelto prescription as the patient is on this for history of DVT, not cardiac reasons. He indicates below this is not his call to make. He recommends the surgical team involve patient's vascular Dr. Donzetta Matters in this decision. It does appear from previous vascular notes that the patient's primary care has been managing this diagnosis as well so would recommend surgeon reach out to them for input on holding this.  I will route this recommendation to the requesting party via Epic fax function and remove from pre-op pool. Please call with questions.  Charlie Pitter, PA-C 05/26/2020, 9:49 AM

## 2020-05-30 NOTE — Telephone Encounter (Signed)
Left second message for patient to call back.

## 2020-06-08 ENCOUNTER — Ambulatory Visit (HOSPITAL_COMMUNITY)
Admission: RE | Admit: 2020-06-08 | Discharge: 2020-06-08 | Disposition: A | Discharge status: Home / Self Care | Payer: Medicare Other | Source: Ambulatory Visit | Attending: Neurological Surgery | Admitting: Neurological Surgery

## 2020-06-08 ENCOUNTER — Encounter (HOSPITAL_COMMUNITY): Payer: Self-pay

## 2020-06-08 ENCOUNTER — Encounter (HOSPITAL_COMMUNITY)
Admission: RE | Admit: 2020-06-08 | Discharge: 2020-06-08 | Disposition: A | Discharge status: Home / Self Care | Payer: Medicare Other | Source: Ambulatory Visit | Attending: Neurological Surgery | Admitting: Neurological Surgery

## 2020-06-08 ENCOUNTER — Other Ambulatory Visit: Payer: Self-pay

## 2020-06-08 ENCOUNTER — Other Ambulatory Visit (HOSPITAL_COMMUNITY)
Admission: RE | Admit: 2020-06-08 | Discharge: 2020-06-08 | Disposition: A | Discharge status: Home / Self Care | Payer: Medicare Other | Source: Ambulatory Visit | Attending: Neurological Surgery | Admitting: Neurological Surgery

## 2020-06-08 DIAGNOSIS — Z01818 Encounter for other preprocedural examination: Secondary | ICD-10-CM | POA: Diagnosis not present

## 2020-06-08 DIAGNOSIS — Z7901 Long term (current) use of anticoagulants: Secondary | ICD-10-CM | POA: Insufficient documentation

## 2020-06-08 DIAGNOSIS — Z01812 Encounter for preprocedural laboratory examination: Secondary | ICD-10-CM | POA: Insufficient documentation

## 2020-06-08 DIAGNOSIS — Z20822 Contact with and (suspected) exposure to covid-19: Secondary | ICD-10-CM | POA: Insufficient documentation

## 2020-06-08 DIAGNOSIS — Z86711 Personal history of pulmonary embolism: Secondary | ICD-10-CM | POA: Insufficient documentation

## 2020-06-08 DIAGNOSIS — M4310 Spondylolisthesis, site unspecified: Secondary | ICD-10-CM

## 2020-06-08 DIAGNOSIS — Z86718 Personal history of other venous thrombosis and embolism: Secondary | ICD-10-CM | POA: Diagnosis not present

## 2020-06-08 LAB — CBC WITH DIFFERENTIAL/PLATELET
Abs Immature Granulocytes: 0.02 10*3/uL (ref 0.00–0.07)
Basophils Absolute: 0.1 10*3/uL (ref 0.0–0.1)
Basophils Relative: 1 %
Eosinophils Absolute: 0.3 10*3/uL (ref 0.0–0.5)
Eosinophils Relative: 5 %
HCT: 43.4 % (ref 39.0–52.0)
Hemoglobin: 13.9 g/dL (ref 13.0–17.0)
Immature Granulocytes: 0 %
Lymphocytes Relative: 23 %
Lymphs Abs: 1.3 10*3/uL (ref 0.7–4.0)
MCH: 30.4 pg (ref 26.0–34.0)
MCHC: 32 g/dL (ref 30.0–36.0)
MCV: 95 fL (ref 80.0–100.0)
Monocytes Absolute: 0.4 10*3/uL (ref 0.1–1.0)
Monocytes Relative: 7 %
Neutro Abs: 3.7 10*3/uL (ref 1.7–7.7)
Neutrophils Relative %: 64 %
Platelets: 240 10*3/uL (ref 150–400)
RBC: 4.57 MIL/uL (ref 4.22–5.81)
RDW: 13 % (ref 11.5–15.5)
WBC: 5.9 10*3/uL (ref 4.0–10.5)
nRBC: 0 % (ref 0.0–0.2)

## 2020-06-08 LAB — SURGICAL PCR SCREEN
MRSA, PCR: NEGATIVE
Staphylococcus aureus: NEGATIVE

## 2020-06-08 LAB — PROTIME-INR
INR: 1 (ref 0.8–1.2)
Prothrombin Time: 12.8 seconds (ref 11.4–15.2)

## 2020-06-08 LAB — BASIC METABOLIC PANEL
Anion gap: 9 (ref 5–15)
BUN: 16 mg/dL (ref 8–23)
CO2: 26 mmol/L (ref 22–32)
Calcium: 9.7 mg/dL (ref 8.9–10.3)
Chloride: 103 mmol/L (ref 98–111)
Creatinine, Ser: 1.42 mg/dL — ABNORMAL HIGH (ref 0.61–1.24)
GFR, Estimated: 49 mL/min — ABNORMAL LOW (ref >60)
Glucose, Bld: 93 mg/dL (ref 70–99)
Potassium: 4.3 mmol/L (ref 3.5–5.1)
Sodium: 138 mmol/L (ref 135–145)

## 2020-06-08 LAB — TYPE AND SCREEN
ABO/RH(D): A NEG
Antibody Screen: NEGATIVE

## 2020-06-08 LAB — SARS CORONAVIRUS 2 (TAT 6-24 HRS): SARS Coronavirus 2: NEGATIVE

## 2020-06-08 NOTE — Progress Notes (Signed)
PCP - [name] Cardiologist -  Jenkins Rouge, MD  Chest x-ray - 06/08/20 EKG - 12/09/19 Stress Test - 2017 ECHO - 10/24/19 Cardiac Cath - denies  Surgery to correct sleep apnea   Blood Thinner Instructions: Xarelto - LD 06/04/20  COVID TEST- 06/08/20 Aspirin- stopped LD 06/05/20  Anesthesia review: yes   Patient denies shortness of breath, fever, cough and chest pain at PAT appointment   All instructions explained to the patient, with a verbal understanding of the material. Patient agrees to go over the instructions while at home for a better understanding. Patient also instructed to self quarantine after being tested for COVID-19. The opportunity to ask questions was provided.

## 2020-06-08 NOTE — Progress Notes (Signed)
[place], [place] [address] Phone: [phone] Fax: [phone]  [id number] [place], [place] [address] Phone: [phone] Fax: [phone]      Your procedure is scheduled on October 15  Report to [facility] Main Entrance "A" at [place].M., and check in at the Admitting office.  Call this number if you have problems the morning of surgery:  [phone]  Call [phone] if you have any questions prior to your surgery date Monday-Friday 8am-4pm    Remember:  Do not eat or drink after midnight the night before your surgery     Take these medicines the morning of surgery with A SIP OF WATER  acetaminophen (TYLENOL)  If needed gabapentin (NEURONTIN) sertraline (ZOLOFT)  tamsulosin (FLOMAX) tiZANidine (ZANAFLEX) If  Needed  Follow your surgeon's instructions on when to stop rivaroxaban (XARELTO).  If no instructions were given by your surgeon then you will need to call the office to get those instructions.    As of today, STOP taking any Aspirin (unless otherwise instructed by your surgeon) Aleve, Naproxen, Ibuprofen, Motrin, Advil, Goody's, BC's, all herbal medications, fish oil, and all vitamins. diclofenac Sodium (VOLTAREN)                      Do not wear jewelry            Do not wear lotions, powders, colognes, or deodorant.            Men may shave face and neck.            Do not bring valuables to the hospital.            [facility] is not responsible for any belongings or valuables.  Do NOT Smoke (Tobacco/Vaping) or drink Alcohol 24 hours prior to your procedure If you use a CPAP at night, you may bring all equipment for your overnight stay.   Contacts, glasses, dentures or bridgework may not be worn into surgery.      For patients admitted to the hospital, discharge time will be determined by your treatment team.    Patients discharged the day of surgery will not be allowed to drive home, and someone needs to stay with them for 24 hours.    Special instructions:   [facility]- Preparing For Surgery  Before surgery, you can play an important role. Because skin is not sterile, your skin needs to be as free of germs as possible. You can reduce the number of germs on your skin by washing with CHG (chlorahexidine gluconate) Soap before surgery.  CHG is an antiseptic cleaner which kills germs and bonds with the skin to continue killing germs even after washing.    Oral Hygiene is also important to reduce your risk of infection.  Remember - BRUSH YOUR TEETH THE MORNING OF SURGERY WITH YOUR REGULAR TOOTHPASTE  Please do not use if you have an allergy to CHG or antibacterial soaps. If your skin becomes reddened/irritated stop using the CHG.  Do not shave (including legs and underarms) for at least 48 hours prior to first CHG shower. It is OK to shave your face.  Please follow these instructions carefully.   1. Shower the NIGHT BEFORE SURGERY and the MORNING OF SURGERY with CHG Soap.   2. If you chose to wash your hair, wash your hair first  as usual with your normal shampoo.  3. After you shampoo, rinse your hair and body thoroughly to remove the shampoo.  4. Use CHG as you would any other liquid soap. You can apply CHG directly to the skin and wash gently with a scrungie or a clean washcloth.   5. Apply the CHG Soap to your body ONLY FROM THE NECK DOWN.  Do not use on open wounds or open sores. Avoid contact with your eyes, ears, mouth and genitals (private parts). Wash Face and genitals (private parts)  with your normal soap.   6. Wash thoroughly, paying special attention to the area where your surgery will be performed.  7. Thoroughly rinse your body with warm water from the neck down.  8. DO NOT shower/wash with your normal soap after using and rinsing off the CHG Soap.  9. Pat yourself dry with a  CLEAN TOWEL.  10. Wear CLEAN PAJAMAS to bed the night before surgery  11. Place CLEAN SHEETS on your bed the night of your first shower and DO NOT SLEEP WITH PETS.   Day of Surgery: Wear Clean/Comfortable clothing the morning of surgery Do not apply any deodorants/lotions.   Remember to brush your teeth WITH YOUR REGULAR TOOTHPASTE.   Please read over the following fact sheets that you were given.

## 2020-06-09 NOTE — Progress Notes (Addendum)
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  Cardiac clearance per telephone encounter 05/16/2020, "Patient is getting > 4 mets of activity.  Given past medical history and time since last visit, based on ACC/AHA guidelines, Adrian Franco would be at acceptable risk for the planned procedure without further cardiovascular testing."  History of DVT/PE is also followed by vascular surgery. They advised it was okay to hold Xarelto in the perioperative period.  Patient reported last dose 06/04/2020.  History of severe OSA, unable to tolerate CPAP.  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  I spoke with device rep Janna Arch [phone]) and he stated device is programmed to only be active while patient is sleeping.  Device does not need any special programming/reprogramming perioperatively.  Recommendation regarding cautery, "if electrosurgical cautery will be used, [employer] recommends the use of bipolar cautery to reduce the potential for undesired effects.  If monopolar cautery was to be used, placed a return electrode such that the electrocautery energy does not travel across the IPG or leads or within 15 cm of them.  Use the lowest possible cautery power setting.  Use care to deliver cautery as far from the implanted components as possible and never directly to the inspire IPG or leads."  Preop labs reviewed, creatinine mildly elevated at 1.42 which is near patient's baseline.  Otherwise unremarkable.  EKG 12/09/2019: Sinus bradycardia with 1st degree A-V block.  Rate 51. Minimal voltage criteria for LVH, may be normal variant ( R in aVL )  CT coronary fractional flow 11/17/19:  - CT FFR analysis didn't show any significant stenosis. -  Aggressive risk factor modification is  recommended.  CT coronary morphology 11/17/19:  1. Coronary calcium score of 329. This was 25 percentile for age and sex matched control. 2. Normal coronary origin with right dominance. 3. Mild ascending aortic aneurysm with maximum diameter 42 mm. This is stable from the prior chest CTA on 05/15/2018. 4. CAD-RADS 3. At least moderate stenosis in the ostial/proximal LAD and ostial RCA, both highly calcified. Additional analysis with CT FFR will be submitted. Consider symptom-guided anti-ischemic pharmacotherapy as well as risk factor modification per guideline directed care. 5. 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  Echo 10/23/19:  1. LVEF is 55 to 60%. LV normal function. LV has no regional wall motion abnormalities. Left ventricular diastolic parameters are consistent with Grade I diastolic dysfunction (impaired relaxation).  2. RV systolic function is normal. RV size is normal. Tricuspid regurgitation signal is inadequate for assessing PA pressure.  3. The mitral valve is normal in structure and function. Trivial mitral  valve regurgitation. No evidence of mitral stenosis.  4. The aortic valve is tricuspid. Aortic valve regurgitation is mild to  moderate. No aortic stenosis is present.  5. Aortic dilatation noted. 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 This has not significantly changed from prior echocardiogram, however does vary from cross sectional imaging measurements. Consider gated CTA aorta to verify measurements.  Nuclear stress test 04/05/16:   Nuclear stress EF: 55%.  Blood pressure demonstrated a hypertensive response to exercise.  There was no ST segment deviation noted during stress.  No T wave inversion was noted during stress.  The study is normal.  This is a low  risk study.  The left ventricular ejection fraction is normal (55-65%).   Adrian Franco, Adrian Franco [facility] Short Stay  Center/Anesthesiology Phone [phone] 06/09/2020 10:30 AM

## 2020-06-09 NOTE — Anesthesia Preprocedure Evaluation (Addendum)
Anesthesia Evaluation  Patient identified by MRN, date of birth, ID band Patient awake    Reviewed: Allergy & Precautions, NPO status , Patient's Chart, lab work & pertinent test results  History of Anesthesia Complications Negative for: history of anesthetic complications  Airway Mallampati: II  TM Distance: >3 FB Neck ROM: Full    Dental  (+) Dental Advisory Given   Pulmonary sleep apnea ,    breath sounds clear to auscultation       Cardiovascular hypertension, Pt. on medications + Peripheral Vascular Disease   Rhythm:Regular  1. Left ventricular ejection fraction, by estimation, is 55 to 60%. The  left ventricle has normal function. The left ventricle has no regional  wall motion abnormalities. Left ventricular diastolic parameters are  consistent with Grade I diastolic  dysfunction (impaired relaxation).  2. Right ventricular systolic function is normal. The right ventricular  size is normal. Tricuspid regurgitation signal is inadequate for assessing  PA pressure.  3. The mitral valve is normal in structure and function. Trivial mitral  valve regurgitation. No evidence of mitral stenosis.  4. The aortic valve is tricuspid. Aortic valve regurgitation is mild to  moderate. No aortic stenosis is present.  5. Aortic dilatation noted. There is moderate dilatation of the aortic  root measuring 50 mm and of the ascending aorta measuring 45 mm. This has  not significantly changed from prior echocardiogram, however does vary  from cross sectional imaging  measurements. Consider gated CTA aorta to verify measurements   Neuro/Psych PSYCHIATRIC DISORDERS Anxiety Depression Spondylolisthesis     GI/Hepatic GERD  Medicated and Controlled,  Endo/Other  negative endocrine ROS  Renal/GU CRFRenal diseaseLab Results      Component                Value               Date                      CREATININE               1.42 (H)             06/08/2020           Lab Results      Component                Value               Date                      K                        4.3                 06/08/2020                Musculoskeletal negative musculoskeletal ROS (+)   Abdominal   Peds  Hematology negative hematology ROS (+) Lab Results      Component                Value               Date                      WBC  5.9                 06/08/2020                HGB                      13.9                06/08/2020                HCT                      43.4                06/08/2020                MCV                      95.0                06/08/2020                PLT                      240                 06/08/2020              Anesthesia Other Findings Neg stress 2017  Reproductive/Obstetrics                           Anesthesia Physical Anesthesia Plan  ASA: II  Anesthesia Plan: General   Post-op Pain Management:    Induction: Intravenous  PONV Risk Score and Plan: 2 and Ondansetron and Dexamethasone  Airway Management Planned: Oral ETT  Additional Equipment: None  Intra-op Plan:   Post-operative Plan: Extubation in OR  Informed Consent: I have reviewed the patients History and Physical, chart, labs and discussed the procedure including the risks, benefits and alternatives for the proposed anesthesia with the patient or authorized representative who has indicated his/her understanding and acceptance.     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  Cardiac clearance per telephone encounter 05/16/2020, "Patient is getting > 4 mets of activity.  Given past medical  history and time since last visit, based on ACC/AHA guidelines, GORDON VANDUNK would be at acceptable risk for the planned procedure without further cardiovascular testing."  History of DVT/PE is also followed by vascular surgery. They advised it was okay to hold Xarelto in the perioperative period.  Patient reported last dose 06/04/2020.  History of severe OSA, unable to tolerate CPAP.  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  I spoke with device rep Janna Arch [phone]) and he stated device is programmed to only be active while patient is sleeping.  Device does not need any special programming/reprogramming perioperatively.  Recommendation regarding cautery, "if electrosurgical cautery will be used, [employer] recommends the use of bipolar cautery to reduce the potential for undesired effects.  If monopolar cautery was to be used, placed a return electrode such that the electrocautery energy does not travel across the  IPG or leads or within 15 cm of them.  Use the lowest possible cautery power setting.  Use care to deliver cautery as far from the implanted components as possible and never directly to the inspire IPG or leads."  Preop labs reviewed, creatinine mildly elevated at 1.42 which is near patient's baseline.  Otherwise unremarkable.  EKG 12/09/2019: Sinus bradycardia with 1st degree A-V block.  Rate 51. Minimal voltage criteria for LVH, may be normal variant ( R in aVL )  CT coronary fractional flow 11/17/19:  - CT FFR analysis didn't show any significant stenosis. -  Aggressive risk factor modification is recommended.  CT coronary morphology 11/17/19:  1. Coronary calcium score of 329. This was 73 percentile for age and sex matched control. 2. Normal coronary origin with right dominance. 3. Mild ascending aortic aneurysm with maximum diameter 42 mm. This is stable from the prior chest CTA on 05/15/2018. 4. CAD-RADS 3. At least moderate stenosis in  the ostial/proximal LAD and ostial RCA, both highly calcified. Additional analysis with CT FFR will be submitted. Consider symptom-guided anti-ischemic pharmacotherapy as well as risk factor modification per guideline directed care. 5. 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  Echo 10/23/19:  1. LVEF is 55 to 60%. LV normal function. LV has no regional wall motion abnormalities. Left ventricular diastolic parameters are consistent with Grade I diastolic dysfunction (impaired relaxation).  2. RV systolic function is normal. RV size is normal. Tricuspid regurgitation signal is inadequate for assessing PA pressure.  3. The mitral valve is normal in structure and function. Trivial mitral  valve regurgitation. No evidence of mitral stenosis.  4. The aortic valve is tricuspid. Aortic valve regurgitation is mild to  moderate. No aortic stenosis is present.  5. Aortic dilatation noted. 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 This has not significantly changed from prior echocardiogram, however does vary from cross sectional imaging measurements. Consider gated CTA aorta to verify measurements.  Nuclear stress test 04/05/16:  Nuclear stress EF: 55%. Blood pressure demonstrated a hypertensive response to exercise. There was no ST segment deviation noted during stress. No T wave inversion was noted during stress. The study is normal. This is a low risk study. The left ventricular ejection fraction is normal (55-65%).  )      Anesthesia Quick Evaluation

## 2020-06-10 ENCOUNTER — Other Ambulatory Visit: Payer: Self-pay

## 2020-06-10 ENCOUNTER — Ambulatory Visit (HOSPITAL_COMMUNITY): Payer: Medicare Other

## 2020-06-10 ENCOUNTER — Ambulatory Visit (HOSPITAL_COMMUNITY): Payer: Medicare Other | Admitting: Physician Assistant

## 2020-06-10 ENCOUNTER — Encounter (HOSPITAL_COMMUNITY): Payer: Self-pay | Admitting: Neurological Surgery

## 2020-06-10 ENCOUNTER — Ambulatory Visit (HOSPITAL_COMMUNITY): Payer: Medicare Other | Admitting: Anesthesiology

## 2020-06-10 ENCOUNTER — Encounter (HOSPITAL_COMMUNITY)
Admission: RE | Disposition: A | Discharge status: Home / Self Care | Payer: Self-pay | Source: Home / Self Care | Attending: Neurological Surgery

## 2020-06-10 ENCOUNTER — Ambulatory Visit (HOSPITAL_COMMUNITY)
Admission: RE | Admit: 2020-06-10 | Discharge: 2020-06-11 | Disposition: A | Discharge status: Home / Self Care | Payer: Medicare Other | Attending: Neurological Surgery | Admitting: Neurological Surgery

## 2020-06-10 DIAGNOSIS — Z419 Encounter for procedure for purposes other than remedying health state, unspecified: Secondary | ICD-10-CM

## 2020-06-10 DIAGNOSIS — G4733 Obstructive sleep apnea (adult) (pediatric): Secondary | ICD-10-CM | POA: Insufficient documentation

## 2020-06-10 DIAGNOSIS — F329 Major depressive disorder, single episode, unspecified: Secondary | ICD-10-CM | POA: Diagnosis not present

## 2020-06-10 DIAGNOSIS — M48061 Spinal stenosis, lumbar region without neurogenic claudication: Secondary | ICD-10-CM | POA: Insufficient documentation

## 2020-06-10 DIAGNOSIS — E785 Hyperlipidemia, unspecified: Secondary | ICD-10-CM | POA: Diagnosis not present

## 2020-06-10 DIAGNOSIS — M4316 Spondylolisthesis, lumbar region: Secondary | ICD-10-CM | POA: Insufficient documentation

## 2020-06-10 DIAGNOSIS — F419 Anxiety disorder, unspecified: Secondary | ICD-10-CM | POA: Insufficient documentation

## 2020-06-10 DIAGNOSIS — G2581 Restless legs syndrome: Secondary | ICD-10-CM | POA: Insufficient documentation

## 2020-06-10 DIAGNOSIS — Z7901 Long term (current) use of anticoagulants: Secondary | ICD-10-CM | POA: Insufficient documentation

## 2020-06-10 DIAGNOSIS — Z86711 Personal history of pulmonary embolism: Secondary | ICD-10-CM | POA: Diagnosis not present

## 2020-06-10 DIAGNOSIS — K219 Gastro-esophageal reflux disease without esophagitis: Secondary | ICD-10-CM | POA: Diagnosis not present

## 2020-06-10 DIAGNOSIS — Z86718 Personal history of other venous thrombosis and embolism: Secondary | ICD-10-CM | POA: Diagnosis not present

## 2020-06-10 DIAGNOSIS — Z79899 Other long term (current) drug therapy: Secondary | ICD-10-CM | POA: Diagnosis not present

## 2020-06-10 DIAGNOSIS — I10 Essential (primary) hypertension: Secondary | ICD-10-CM | POA: Insufficient documentation

## 2020-06-10 DIAGNOSIS — Z981 Arthrodesis status: Secondary | ICD-10-CM

## 2020-06-10 DIAGNOSIS — M79606 Pain in leg, unspecified: Secondary | ICD-10-CM | POA: Diagnosis not present

## 2020-06-10 LAB — ABO/RH: ABO/RH(D): A NEG

## 2020-06-10 SURGERY — POSTERIOR LUMBAR FUSION 1 LEVEL
Anesthesia: General | Site: Back

## 2020-06-10 MED ORDER — DEXAMETHASONE SODIUM PHOSPHATE 10 MG/ML IJ SOLN
INTRAMUSCULAR | Status: DC | PRN
  Administered 2020-06-10: 10 mg via INTRAVENOUS

## 2020-06-10 MED ORDER — THROMBIN 20000 UNITS EX SOLR
CUTANEOUS | Status: AC
  Filled 2020-06-10: qty 20000

## 2020-06-10 MED ORDER — BUPIVACAINE HCL (PF) 0.25 % IJ SOLN
INTRAMUSCULAR | Status: DC | PRN
  Administered 2020-06-10 (×2): 10 mL

## 2020-06-10 MED ORDER — CEFAZOLIN SODIUM-DEXTROSE 2-4 GM/100ML-% IV SOLN
2.0000 g | INTRAVENOUS | Status: AC
  Administered 2020-06-10: 2 g via INTRAVENOUS
  Filled 2020-06-10: qty 100

## 2020-06-10 MED ORDER — DOCUSATE SODIUM 100 MG PO CAPS
100.0000 mg | ORAL_CAPSULE | Freq: Every day | ORAL | Status: DC
  Administered 2020-06-10 – 2020-06-11 (×2): 100 mg via ORAL
  Filled 2020-06-10 (×2): qty 1

## 2020-06-10 MED ORDER — FENTANYL CITRATE (PF) 100 MCG/2ML IJ SOLN
25.0000 ug | Freq: Once | INTRAMUSCULAR | Status: AC
  Administered 2020-06-10: 25 ug via INTRAVENOUS

## 2020-06-10 MED ORDER — DEXAMETHASONE 4 MG PO TABS
4.0000 mg | ORAL_TABLET | Freq: Four times a day (QID) | ORAL | Status: DC
  Administered 2020-06-10 – 2020-06-11 (×2): 4 mg via ORAL
  Filled 2020-06-10 (×2): qty 1

## 2020-06-10 MED ORDER — EPHEDRINE SULFATE 50 MG/ML IJ SOLN
INTRAMUSCULAR | Status: DC | PRN
  Administered 2020-06-10 (×2): 5 mg via INTRAVENOUS

## 2020-06-10 MED ORDER — TAMSULOSIN HCL 0.4 MG PO CAPS
0.4000 mg | ORAL_CAPSULE | Freq: Every day | ORAL | Status: DC
  Administered 2020-06-10 – 2020-06-11 (×2): 0.4 mg via ORAL
  Filled 2020-06-10: qty 1

## 2020-06-10 MED ORDER — FENTANYL CITRATE (PF) 250 MCG/5ML IJ SOLN
INTRAMUSCULAR | Status: AC
  Filled 2020-06-10: qty 5

## 2020-06-10 MED ORDER — PHENYLEPHRINE HCL (PRESSORS) 10 MG/ML IV SOLN
INTRAVENOUS | Status: DC | PRN
  Administered 2020-06-10: 40 ug via INTRAVENOUS
  Administered 2020-06-10: 80 ug via INTRAVENOUS

## 2020-06-10 MED ORDER — CHLORHEXIDINE GLUCONATE CLOTH 2 % EX PADS: 6.0000 | MEDICATED_PAD | Freq: Once | CUTANEOUS | Status: DC

## 2020-06-10 MED ORDER — IRBESARTAN 150 MG PO TABS
150.0000 mg | ORAL_TABLET | Freq: Every day | ORAL | Status: DC
  Administered 2020-06-10 – 2020-06-11 (×2): 150 mg via ORAL
  Filled 2020-06-10 (×2): qty 1

## 2020-06-10 MED ORDER — LIDOCAINE 2% (20 MG/ML) 5 ML SYRINGE
INTRAMUSCULAR | Status: AC
  Filled 2020-06-10: qty 5

## 2020-06-10 MED ORDER — MIDAZOLAM HCL 5 MG/5ML IJ SOLN
INTRAMUSCULAR | Status: DC | PRN
  Administered 2020-06-10: 2 mg via INTRAVENOUS

## 2020-06-10 MED ORDER — MORPHINE SULFATE (PF) 2 MG/ML IV SOLN
2.0000 mg | INTRAVENOUS | Status: DC | PRN
  Administered 2020-06-10: 2 mg via INTRAVENOUS
  Filled 2020-06-10: qty 1

## 2020-06-10 MED ORDER — SENNA 8.6 MG PO TABS
1.0000 | ORAL_TABLET | Freq: Two times a day (BID) | ORAL | Status: DC
  Administered 2020-06-10 – 2020-06-11 (×3): 8.6 mg via ORAL
  Filled 2020-06-10 (×3): qty 1

## 2020-06-10 MED ORDER — CHLORHEXIDINE GLUCONATE 0.12 % MT SOLN
15.0000 mL | Freq: Once | OROMUCOSAL | Status: AC
  Administered 2020-06-10: 15 mL via OROMUCOSAL
  Filled 2020-06-10: qty 15

## 2020-06-10 MED ORDER — ONDANSETRON HCL 4 MG/2ML IJ SOLN
INTRAMUSCULAR | Status: AC
  Filled 2020-06-10: qty 2

## 2020-06-10 MED ORDER — PHENYLEPHRINE HCL-NACL 10-0.9 MG/250ML-% IV SOLN
INTRAVENOUS | Status: DC | PRN
  Administered 2020-06-10: 30 ug/min via INTRAVENOUS

## 2020-06-10 MED ORDER — THROMBIN 20000 UNITS EX SOLR
CUTANEOUS | Status: DC | PRN
  Administered 2020-06-10: 20 mL

## 2020-06-10 MED ORDER — OXYCODONE HCL 5 MG PO TABS
5.0000 mg | ORAL_TABLET | ORAL | Status: DC | PRN
  Administered 2020-06-10 – 2020-06-11 (×5): 5 mg via ORAL
  Filled 2020-06-10 (×5): qty 1

## 2020-06-10 MED ORDER — SODIUM CHLORIDE 0.9 % IV SOLN: 250.0000 mL | INTRAVENOUS | Status: DC

## 2020-06-10 MED ORDER — DEXAMETHASONE SODIUM PHOSPHATE 10 MG/ML IJ SOLN
INTRAMUSCULAR | Status: AC
  Filled 2020-06-10: qty 1

## 2020-06-10 MED ORDER — DEXAMETHASONE SODIUM PHOSPHATE 4 MG/ML IJ SOLN
4.0000 mg | Freq: Four times a day (QID) | INTRAMUSCULAR | Status: DC
  Administered 2020-06-10: 4 mg via INTRAVENOUS
  Filled 2020-06-10: qty 1

## 2020-06-10 MED ORDER — MENTHOL 3 MG MT LOZG: 1.0000 | LOZENGE | OROMUCOSAL | Status: DC | PRN

## 2020-06-10 MED ORDER — 0.9 % SODIUM CHLORIDE (POUR BTL) OPTIME
TOPICAL | Status: DC | PRN
  Administered 2020-06-10: 1000 mL

## 2020-06-10 MED ORDER — ORAL CARE MOUTH RINSE: 15.0000 mL | Freq: Once | OROMUCOSAL | Status: AC

## 2020-06-10 MED ORDER — ROCURONIUM BROMIDE 10 MG/ML (PF) SYRINGE
PREFILLED_SYRINGE | INTRAVENOUS | Status: DC | PRN
  Administered 2020-06-10: 20 mg via INTRAVENOUS
  Administered 2020-06-10: 70 mg via INTRAVENOUS

## 2020-06-10 MED ORDER — SODIUM CHLORIDE (PF) 0.9 % IJ SOLN
INTRAMUSCULAR | Status: DC | PRN
  Administered 2020-06-10: 2.5 mL

## 2020-06-10 MED ORDER — LIDOCAINE 2% (20 MG/ML) 5 ML SYRINGE
INTRAMUSCULAR | Status: DC | PRN
  Administered 2020-06-10: 50 mg via INTRAVENOUS

## 2020-06-10 MED ORDER — ROCURONIUM BROMIDE 10 MG/ML (PF) SYRINGE
PREFILLED_SYRINGE | INTRAVENOUS | Status: AC
  Filled 2020-06-10: qty 10

## 2020-06-10 MED ORDER — ROPINIROLE HCL 1 MG PO TABS
2.0000 mg | ORAL_TABLET | Freq: Every day | ORAL | Status: DC
  Administered 2020-06-10: 2 mg via ORAL
  Filled 2020-06-10: qty 2

## 2020-06-10 MED ORDER — SUGAMMADEX SODIUM 200 MG/2ML IV SOLN
INTRAVENOUS | Status: DC | PRN
  Administered 2020-06-10: 200 mg via INTRAVENOUS

## 2020-06-10 MED ORDER — ACETAMINOPHEN 325 MG PO TABS: 650.0000 mg | ORAL_TABLET | ORAL | Status: DC | PRN

## 2020-06-10 MED ORDER — POTASSIUM CHLORIDE IN NACL 20-0.9 MEQ/L-% IV SOLN: INTRAVENOUS | Status: DC

## 2020-06-10 MED ORDER — GABAPENTIN 300 MG PO CAPS: 300.0000 mg | ORAL_CAPSULE | Freq: Every evening | ORAL | Status: DC | PRN

## 2020-06-10 MED ORDER — SODIUM CHLORIDE 0.9% FLUSH: 3.0000 mL | INTRAVENOUS | Status: DC | PRN

## 2020-06-10 MED ORDER — FENOFIBRATE 54 MG PO TABS
54.0000 mg | ORAL_TABLET | Freq: Every day | ORAL | Status: DC
  Administered 2020-06-10 – 2020-06-11 (×2): 54 mg via ORAL
  Filled 2020-06-10 (×2): qty 1

## 2020-06-10 MED ORDER — SODIUM CHLORIDE 0.9% FLUSH
3.0000 mL | Freq: Two times a day (BID) | INTRAVENOUS | Status: DC
  Administered 2020-06-10: 3 mL via INTRAVENOUS

## 2020-06-10 MED ORDER — SERTRALINE HCL 50 MG PO TABS
50.0000 mg | ORAL_TABLET | Freq: Every day | ORAL | Status: DC
  Administered 2020-06-10 – 2020-06-11 (×2): 50 mg via ORAL
  Filled 2020-06-10 (×2): qty 1

## 2020-06-10 MED ORDER — BUPIVACAINE HCL (PF) 0.25 % IJ SOLN
INTRAMUSCULAR | Status: AC
  Filled 2020-06-10: qty 30

## 2020-06-10 MED ORDER — CEFAZOLIN SODIUM-DEXTROSE 2-4 GM/100ML-% IV SOLN
2.0000 g | Freq: Three times a day (TID) | INTRAVENOUS | Status: AC
  Administered 2020-06-10 – 2020-06-11 (×2): 2 g via INTRAVENOUS
  Filled 2020-06-10 (×2): qty 100

## 2020-06-10 MED ORDER — MIDAZOLAM HCL 2 MG/2ML IJ SOLN
INTRAMUSCULAR | Status: AC
  Filled 2020-06-10: qty 2

## 2020-06-10 MED ORDER — PROPOFOL 10 MG/ML IV BOLUS
INTRAVENOUS | Status: DC | PRN
  Administered 2020-06-10: 160 mg via INTRAVENOUS

## 2020-06-10 MED ORDER — ONDANSETRON HCL 4 MG/2ML IJ SOLN
INTRAMUSCULAR | Status: DC | PRN
  Administered 2020-06-10: 4 mg via INTRAVENOUS

## 2020-06-10 MED ORDER — PROPOFOL 10 MG/ML IV BOLUS
INTRAVENOUS | Status: AC
  Filled 2020-06-10: qty 20

## 2020-06-10 MED ORDER — THROMBIN 5000 UNITS EX SOLR
OROMUCOSAL | Status: DC | PRN
  Administered 2020-06-10: 5 mL

## 2020-06-10 MED ORDER — FENTANYL CITRATE (PF) 100 MCG/2ML IJ SOLN
INTRAMUSCULAR | Status: AC
  Filled 2020-06-10: qty 2

## 2020-06-10 MED ORDER — ONDANSETRON HCL 4 MG PO TABS: 4.0000 mg | ORAL_TABLET | Freq: Four times a day (QID) | ORAL | Status: DC | PRN

## 2020-06-10 MED ORDER — THROMBIN 5000 UNITS EX SOLR
CUTANEOUS | Status: AC
  Filled 2020-06-10: qty 5000

## 2020-06-10 MED ORDER — PHENOL 1.4 % MT LIQD: 1.0000 | OROMUCOSAL | Status: DC | PRN

## 2020-06-10 MED ORDER — ZOLPIDEM TARTRATE 5 MG PO TABS
10.0000 mg | ORAL_TABLET | Freq: Every evening | ORAL | Status: DC | PRN
  Administered 2020-06-10: 10 mg via ORAL
  Filled 2020-06-10: qty 2

## 2020-06-10 MED ORDER — ACETAMINOPHEN 650 MG RE SUPP: 650.0000 mg | RECTAL | Status: DC | PRN

## 2020-06-10 MED ORDER — FENTANYL CITRATE (PF) 250 MCG/5ML IJ SOLN
INTRAMUSCULAR | Status: DC | PRN
Start: 2020-06-10 — End: 2020-06-10
  Administered 2020-06-10 (×2): 50 ug via INTRAVENOUS
  Administered 2020-06-10: 100 ug via INTRAVENOUS

## 2020-06-10 MED ORDER — LACTATED RINGERS IV SOLN: INTRAVENOUS | Status: DC

## 2020-06-10 MED ORDER — ONDANSETRON HCL 4 MG/2ML IJ SOLN: 4.0000 mg | Freq: Four times a day (QID) | INTRAMUSCULAR | Status: DC | PRN

## 2020-06-10 MED ORDER — TIZANIDINE HCL 4 MG PO TABS: 2.0000 mg | ORAL_TABLET | Freq: Every evening | ORAL | Status: DC | PRN

## 2020-06-10 MED ORDER — DEXAMETHASONE SODIUM PHOSPHATE 10 MG/ML IJ SOLN
10.0000 mg | Freq: Once | INTRAMUSCULAR | Status: DC
  Filled 2020-06-10: qty 1

## 2020-06-10 SURGICAL SUPPLY — 60 items
BASKET BONE COLLECTION (BASKET) ×2 IMPLANT
BENZOIN TINCTURE PRP APPL 2/3 (GAUZE/BANDAGES/DRESSINGS) ×2 IMPLANT
BLADE CLIPPER SURG (BLADE) IMPLANT
BONE CANC CHIPS 20CC PCAN1/4 (Bone Implant) ×2 IMPLANT
BUR CARBIDE MATCH 3.0 (BURR) ×2 IMPLANT
CANISTER SUCT 3000ML PPV (MISCELLANEOUS) ×2 IMPLANT
CHIPS CANC BONE 20CC PCAN1/4 (Bone Implant) ×1 IMPLANT
CNTNR URN SCR LID CUP LEK RST (MISCELLANEOUS) ×1 IMPLANT
CONT SPEC 4OZ STRL OR WHT (MISCELLANEOUS) ×1
COVER BACK TABLE 60X90IN (DRAPES) ×2 IMPLANT
COVER WAND RF STERILE (DRAPES) ×2 IMPLANT
DERMABOND ADVANCED (GAUZE/BANDAGES/DRESSINGS) ×1
DERMABOND ADVANCED .7 DNX12 (GAUZE/BANDAGES/DRESSINGS) ×1 IMPLANT
DIFFUSER DRILL AIR PNEUMATIC (MISCELLANEOUS) ×2 IMPLANT
DRAPE C-ARM 42X72 X-RAY (DRAPES) ×4 IMPLANT
DRAPE LAPAROTOMY 100X72X124 (DRAPES) ×2 IMPLANT
DRAPE SURG 17X23 STRL (DRAPES) ×2 IMPLANT
DRSG OPSITE POSTOP 4X6 (GAUZE/BANDAGES/DRESSINGS) ×2 IMPLANT
DURAPREP 26ML APPLICATOR (WOUND CARE) ×2 IMPLANT
ELECT REM PT RETURN 9FT ADLT (ELECTROSURGICAL) ×2
ELECTRODE REM PT RTRN 9FT ADLT (ELECTROSURGICAL) ×1 IMPLANT
EVACUATOR 1/8 PVC DRAIN (DRAIN) ×2 IMPLANT
GAUZE 4X4 16PLY RFD (DISPOSABLE) IMPLANT
GLOVE BIO SURGEON STRL SZ7 (GLOVE) IMPLANT
GLOVE BIO SURGEON STRL SZ8 (GLOVE) ×4 IMPLANT
GLOVE BIOGEL PI IND STRL 7.0 (GLOVE) IMPLANT
GLOVE BIOGEL PI INDICATOR 7.0 (GLOVE)
GOWN STRL REUS W/ TWL LRG LVL3 (GOWN DISPOSABLE) IMPLANT
GOWN STRL REUS W/ TWL XL LVL3 (GOWN DISPOSABLE) ×2 IMPLANT
GOWN STRL REUS W/TWL 2XL LVL3 (GOWN DISPOSABLE) IMPLANT
GOWN STRL REUS W/TWL LRG LVL3 (GOWN DISPOSABLE)
GOWN STRL REUS W/TWL XL LVL3 (GOWN DISPOSABLE) ×2
GRAFT BONE PROTEIOS MED 2.5CC (Orthopedic Implant) ×2 IMPLANT
HEMOSTAT POWDER KIT SURGIFOAM (HEMOSTASIS) IMPLANT
KIT BASIN OR (CUSTOM PROCEDURE TRAY) ×2 IMPLANT
KIT BONE MRW ASP ANGEL CPRP (KITS) IMPLANT
KIT TURNOVER KIT B (KITS) ×2 IMPLANT
MILL MEDIUM DISP (BLADE) ×2 IMPLANT
NEEDLE HYPO 25X1 1.5 SAFETY (NEEDLE) ×2 IMPLANT
NS IRRIG 1000ML POUR BTL (IV SOLUTION) ×2 IMPLANT
PACK LAMINECTOMY NEURO (CUSTOM PROCEDURE TRAY) ×2 IMPLANT
PAD ARMBOARD 7.5X6 YLW CONV (MISCELLANEOUS) ×6 IMPLANT
ROD LORD LIPPED TI 5.5X35 (Rod) ×4 IMPLANT
SCREW CORT SHANK MOD 6.5X40 (Screw) ×8 IMPLANT
SCREW POLYAXIAL TULIP (Screw) ×8 IMPLANT
SET SCREW (Screw) ×4 IMPLANT
SET SCREW SPNE (Screw) ×4 IMPLANT
SPACER BATT PS 9X25X12 (Spacer) ×4 IMPLANT
SPONGE LAP 4X18 RFD (DISPOSABLE) IMPLANT
SPONGE SURGIFOAM ABS GEL 100 (HEMOSTASIS) ×2 IMPLANT
STRIP CLOSURE SKIN 1/2X4 (GAUZE/BANDAGES/DRESSINGS) ×2 IMPLANT
SUT VIC AB 0 CT1 18XCR BRD8 (SUTURE) ×1 IMPLANT
SUT VIC AB 0 CT1 8-18 (SUTURE) ×1
SUT VIC AB 2-0 CP2 18 (SUTURE) ×2 IMPLANT
SUT VIC AB 3-0 SH 8-18 (SUTURE) ×4 IMPLANT
SYR 3ML LL SCALE MARK (SYRINGE) ×2 IMPLANT
TOWEL GREEN STERILE (TOWEL DISPOSABLE) ×2 IMPLANT
TOWEL GREEN STERILE FF (TOWEL DISPOSABLE) ×2 IMPLANT
TRAY FOLEY MTR SLVR 16FR STAT (SET/KITS/TRAYS/PACK) ×2 IMPLANT
WATER STERILE IRR 1000ML POUR (IV SOLUTION) ×2 IMPLANT

## 2020-06-10 NOTE — Anesthesia Procedure Notes (Signed)
Procedure Name: Intubation Date/Time: 06/10/2020 9:31 AM Performed by: Amadeo Garnet, CRNA Pre-anesthesia Checklist: Patient identified, Emergency Drugs available, Suction available and Patient being monitored Patient Re-evaluated:Patient Re-evaluated prior to induction Oxygen Delivery Method: Circle system utilized Preoxygenation: Pre-oxygenation with 100% oxygen Induction Type: IV induction Ventilation: Mask ventilation without difficulty and Oral airway inserted - appropriate to patient size Laryngoscope Size: Mac and 4 Grade View: Grade I Tube type: Oral Tube size: 7.5 mm Number of attempts: 1 Airway Equipment and Method: Stylet Placement Confirmation: ETT inserted through vocal cords under direct vision,  positive ETCO2 and breath sounds checked- equal and bilateral Secured at: 22 cm Tube secured with: Tape Dental Injury: Teeth and Oropharynx as per pre-operative assessment

## 2020-06-10 NOTE — H&P (Signed)
Subjective: Patient is a 74 y.o. male admitted for spondylolisthesis. Onset of symptoms was several months ago, gradually worsening since that time.  The pain is rated severe, and is located at the across the lower back and radiates to legs. The pain is described as aching and occurs all day. The symptoms have been progressive. Symptoms are exacerbated by exercise. MRI or CT showed anterolisthesis with stenosis L4-5  Past Medical History:  Diagnosis Date  . Anxiety   . Aortic aneurysm ([place])    followed by Dr Johnsie Cancel  . Atypical chest pain   . BPH (benign prostatic hypertrophy)   . Chronic back pain   . Depression   . DVT (deep venous thrombosis) ([place])   . GERD (gastroesophageal reflux disease)   . History of kidney stones   . HTN (hypertension)   . Hyperlipemia   . Hypogonadism in male   . Insomnia   . Localized edema   . PE (pulmonary embolism)   . Renal insufficiency   . Restless leg syndrome   . Sleep apnea    severe OSA-cannot tolerate CPAP  . SOB (shortness of breath)   . Testicular hypofunction     Past Surgical History:  Procedure Laterality Date  . DRUG INDUCED ENDOSCOPY N/A 12/09/2019   Procedure: DRUG INDUCED ENDOSCOPY;  Surgeon: Melida Quitter, MD;  Location: [facility];  Service: ENT;  Laterality: N/A;  . IMPLANTATION OF HYPOGLOSSAL NERVE STIMULATOR Right 02/17/2020   Procedure: IMPLANTATION OF HYPOGLOSSAL NERVE STIMULATOR;  Surgeon: Melida Quitter, MD;  Location: [place];  Service: ENT;  Laterality: Right;  . INGUINAL HERNIA REPAIR    . KIDNEY STONE SURGERY     stent  . ROTATOR CUFF REPAIR Right     Prior to Admission medications   Medication Sig Start Date End Date Taking? Authorizing Provider  acetaminophen (TYLENOL) 500 MG tablet Take 1,000 mg by mouth every 6 (six) hours as needed for mild pain or moderate pain.    Yes [provider]  Ascorbic Acid (VITAMIN C PO) Take 1 tablet by mouth daily.   Yes [provider]  B Complex  Vitamins (VITAMIN B COMPLEX PO) Take 1 tablet by mouth daily.   Yes [provider]  diclofenac Sodium (VOLTAREN) 1 % GEL Apply 1 application topically 4 (four) times daily as needed (pain).   Yes [provider]  docusate sodium (COLACE) 100 MG capsule Take 100 mg by mouth daily.    Yes [provider]  fenofibrate (TRICOR) 145 MG tablet Take 145 mg by mouth daily.   Yes [provider]  gabapentin (NEURONTIN) 300 MG capsule Take 300 mg by mouth at bedtime as needed (pain).    Yes [provider]  irbesartan (AVAPRO) 150 MG tablet Take 150 mg by mouth daily. 09/30/19  Yes [provider]  Multiple Vitamins-Minerals (ONE-A-DAY MENS 50+ ADVANTAGE) TABS Take 1 tablet by mouth daily with breakfast.   Yes [provider]  omeprazole (PRILOSEC) 40 MG capsule Take 40 mg by mouth every evening.    Yes [provider]  OVER THE COUNTER MEDICATION Take 2 tablets by mouth at bedtime. Cramp Defense otc supplement   Yes [provider]  rivaroxaban (XARELTO) 20 MG TABS tablet Take 1 tablet (20 mg total) by mouth daily with supper. 10/19/16  Yes Josue Hector, MD  rOPINIRole (REQUIP) 2 MG tablet Take 2 mg by mouth at bedtime.    Yes [provider]  sertraline (ZOLOFT) 100  MG tablet Take 50 mg by mouth daily.    Yes [provider]  simvastatin (ZOCOR) 40 MG tablet Take 1 tablet (40 mg total) by mouth daily. 11/30/19  Yes Josue Hector, MD  Specialty Vitamins Products (ADVANCED COLLAGEN PO) Take 1 tablet by mouth daily.   Yes [provider]  tamsulosin (FLOMAX) 0.4 MG CAPS capsule Take 0.4 mg by mouth daily.    Yes [provider]  Testosterone Cypionate 200 MG/ML SOLN Inject 200 mg into the muscle every 14 (fourteen) days.    Yes [provider]  tiZANidine (ZANAFLEX) 2 MG tablet Take 2 mg by mouth at bedtime as needed for muscle spasms.  09/22/19  Yes [provider]  VITAMIN D  PO Take 1 tablet by mouth daily.   Yes [provider]  zolpidem (AMBIEN) 10 MG tablet Take 10 mg by mouth at bedtime.    Yes [provider]  HYDROcodone-acetaminophen (NORCO/VICODIN) 5-325 MG tablet Take 1-2 tablets by mouth every 6 (six) hours as needed for moderate pain. Patient not taking: Reported on 05/31/2020 02/17/20 02/16/21  Melida Quitter, MD   Allergies  Allergen Reactions  . Sulfa Antibiotics Itching  . Amoxapine And Related Hives and Rash  . Lotensin [Benazepril Hcl] Hives and Rash    Social History   Tobacco Use  . Smoking status: Never Smoker  . Smokeless tobacco: Never Used  Substance Use Topics  . Alcohol use: No    Alcohol/week: 0.0 standard drinks    Family History  Problem Relation Age of Onset  . Hyperlipidemia Mother   . Hyperlipidemia Father   . Heart attack Father   . Healthy Son   . Healthy Brother   . Other Brother   . Healthy Sister   . Healthy Son      Review of Systems  Positive ROS: Negative  All other systems have been reviewed and were otherwise negative with the exception of those mentioned in the HPI and as above.  Objective: Vital signs in last 24 hours: Temp:  [97.6 F (36.4 C)] 97.6 F (36.4 C) (10/15 0826) Pulse Rate:  [62] 62 (10/15 0826) Resp:  [18] 18 (10/15 0826) BP: (138)/(64) 138/64 (10/15 0826) SpO2:  [98 %] 98 % (10/15 0826) Weight:  [86.2 kg] 86.2 kg (10/15 0826)  General Appearance: Alert, cooperative, no distress, appears stated age Head: Normocephalic, without obvious abnormality, atraumatic Eyes: PERRL, conjunctiva/corneas clear, EOM's intact    Neck: Supple, symmetrical, trachea midline Back: Symmetric, no curvature, ROM normal, no CVA tenderness Lungs:  respirations unlabored Heart: Regular rate and rhythm Abdomen: Soft, non-tender Extremities: Extremities normal, atraumatic, no cyanosis or edema Pulses: 2+ and symmetric all extremities Skin: Skin color, texture, turgor normal, no rashes or  lesions  NEUROLOGIC:   Mental status: Alert and oriented x4,  no aphasia, good attention span, fund of knowledge, and memory Motor Exam - grossly normal Sensory Exam - grossly normal Reflexes: 1+ Coordination - grossly normal Gait - grossly normal Balance - grossly normal Cranial Nerves: I: smell Not tested  II: visual acuity  OS: nl    OD: nl  II: visual fields Full to confrontation  II: pupils Equal, round, reactive to light  III,VII: ptosis None  III,IV,VI: extraocular muscles  Full ROM  V: mastication Normal  V: facial light touch sensation  Normal  V,VII: corneal reflex  Present  VII: facial muscle function - upper  Normal  VII: facial muscle function - lower Normal  VIII: hearing  Not tested  IX: soft palate elevation  Normal  IX,X: gag reflex Present  XI: trapezius strength  5/5  XI: sternocleidomastoid strength 5/5  XI: neck flexion strength  5/5  XII: tongue strength  Normal    Data Review Lab Results  Component Value Date   WBC 5.9 06/08/2020   HGB 13.9 06/08/2020   HCT 43.4 06/08/2020   MCV 95.0 06/08/2020   PLT 240 06/08/2020   Lab Results  Component Value Date   NA 138 06/08/2020   K 4.3 06/08/2020   CL 103 06/08/2020   CO2 26 06/08/2020   BUN 16 06/08/2020   CREATININE 1.42 (H) 06/08/2020   GLUCOSE 93 06/08/2020   Lab Results  Component Value Date   INR 1.0 06/08/2020    Assessment/Plan:  Estimated body mass index is 28.89 kg/m as calculated from the following:   Height as of this encounter: 5\' 8"  (1.727 m).   Weight as of this encounter: 86.2 kg. Patient admitted for PLIF L4-5. Patient has failed a reasonable attempt at conservative therapy.  I explained the condition and procedure to the patient and answered any questions.  Patient wishes to proceed with procedure as planned. Understands risks/ benefits and typical outcomes of procedure.   Eustace Moore 06/10/2020 8:34 AM

## 2020-06-10 NOTE — Op Note (Signed)
06/10/2020  11:48 AM  PATIENT:  Adrian Franco  74 y.o. 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 Decompressive lumbar laminectomy hemifacetectomy and foraminotomies bilaterally L4-5 requiring more work than would be required for a simple exposure of the disk for PLIF in order to adequately decompress the neural elements and address the spinal stenosis 2. Posterior lumbar interbody fusion L4-5 using peek interbody cages packed with morcellized allograft and autograft  3. Posterior fixation L4-5 using Alphatec cortical pedicle screws.  4. Intertransverse arthrodesis L4-5 using morcellized autograft and allograft.  SURGEON:  Sherley Bounds, MD  ASSISTANTS: Dr. Reatha Armour  ANESTHESIA:  General  EBL: 100 ml  Total I/O In: 1100 [I.V.:1000; IV Piggyback:100] Out: 235 [Urine:135; Blood:100]  BLOOD ADMINISTERED:none  DRAINS: none   INDICATION FOR PROCEDURE: This patient presented with severe back and leg pain. Imaging revealed degenerative spondylolisthesis L4-5 with spinal stenosis. The patient tried a reasonable attempt at conservative medical measures without relief. I recommended decompression and instrumented fusion to address the stenosis as well as the segmental  instability.  Patient understood the risks, benefits, and alternatives and potential outcomes and wished to proceed.  PROCEDURE DETAILS:  The patient was brought to the operating room. After induction of generalized endotracheal anesthesia the patient was rolled into the prone position on chest rolls and all pressure points were padded. The patient's lumbar region was cleaned and then prepped with DuraPrep and draped in the usual sterile fashion. Anesthesia was injected and then a dorsal midline incision was made and carried down to the lumbosacral fascia. The fascia was opened and the paraspinous musculature was  taken down in a subperiosteal fashion to expose L4-5. A self-retaining retractor was placed. Intraoperative fluoroscopy confirmed my level, and I started with placement of the L4 cortical pedicle screws. The pedicle screw entry zones were identified utilizing surface landmarks and  AP and lateral fluoroscopy. I scored the cortex with the high-speed drill and then used the hand drill to drill an upward and outward direction into the pedicle. I then tapped line to line. I then placed a 6.5 x 40 mm cortical pedicle screw into the pedicles of L4 bilaterally. .  I then turned my attention to the decompression and complete lumbar laminectomies, hemi- facetectomies, and foraminotomies were performed at L4-5.  My nurse practitioner was directly involved in the decompression and exposure of the neural elements. the patient had significant spinal stenosis and this required more work than would be required for a simple exposure of the disc for posterior lumbar interbody fusion which would only require a limited laminotomy. Much more generous decompression and generous foraminotomy was undertaken in order to adequately decompress the neural elements and address the patient's leg pain. The yellow ligament was removed to expose the underlying dura and nerve roots, and generous foraminotomies were performed to adequately decompress the neural elements. Both the exiting and traversing nerve roots were decompressed on both sides until a coronary dilator passed easily along the nerve roots. Once the decompression was complete, I turned my attention to the posterior lower lumbar interbody fusion. The epidural venous vasculature was coagulated and cut sharply. Disc space was incised and the initial discectomy was performed with pituitary rongeurs. The disc space was distracted with sequential distractors to a height of 12 mm. We then used a series of scrapers and shavers to prepare the endplates for fusion. The midline was prepared  with  Epstein curettes. Once the complete discectomy was finished, we packed an appropriate sized interbody cage with local autograft and morcellized allograft, gently retracted the nerve root, and tapped the cage into position at L4-5.  Dr. Reatha Armour assisted with placement of the cages.  The midline between the cages was packed with morselized autograft and allograft. We then turned our attention to the placement of the lower pedicle screws. The pedicle screw entry zones were identified utilizing surface landmarks and fluoroscopy. I drilled into each pedicle utilizing the hand drill, and tapped each pedicle with the appropriate tap. We palpated with a ball probe to assure no break in the cortex. We then placed 6.5 x 40 mm cortical pedicle screws into the pedicles bilaterally at L5.  Dr. Reatha Armour assisted in placement of the pedicle screws.  We then decorticated the transverse processes and laid a mixture of morcellized autograft and allograft out over these to perform intertransverse arthrodesis at L4-5. We then placed lordotic rods into the multiaxial screw heads of the pedicle screws and locked these in position with the locking caps and anti-torque device. We then checked our construct with AP and lateral fluoroscopy. Irrigated with copious amounts of bacitracin-containing saline solution. Inspected the nerve roots once again to assure adequate decompression, lined to the dura with Gelfoam,  and then we closed the muscle and the fascia with 0 Vicryl. Closed the subcutaneous tissues with 2-0 Vicryl and subcuticular tissues with 3-0 Vicryl. The skin was closed with benzoin and Steri-Strips. Dressing was then applied, the patient was awakened from general anesthesia and transported to the recovery room in stable condition. At the end of the procedure all sponge, needle and instrument counts were correct.   PLAN OF CARE: admit to inpatient  PATIENT DISPOSITION:  PACU - hemodynamically stable.   Delay start of  Pharmacological VTE agent (>24hrs) due to surgical blood loss or risk of bleeding:  yes

## 2020-06-10 NOTE — Transfer of Care (Signed)
Immediate Anesthesia Transfer of Care Note  Patient: Adrian Franco  Procedure(s) Performed: Posterior Lateral Interbody Fusion - Lumbar Four-Lumbar Five (N/A Back)  Patient Location: PACU  Anesthesia Type:General  Level of Consciousness: awake, alert  and oriented  Airway & Oxygen Therapy: Patient Spontanous Breathing and Patient connected to face mask oxygen  Post-op Assessment: Report given to RN, Post -op Vital signs reviewed and stable and Patient moving all extremities  Post vital signs: Reviewed and stable  Last Vitals:  Vitals Value Taken Time  BP 135/60 06/10/20 1200  Temp 36.4 C 06/10/20 1200  Pulse 61 06/10/20 1203  Resp 13 06/10/20 1203  SpO2 98 % 06/10/20 1203  Vitals shown include unvalidated device data.  Last Pain:  Vitals:   06/10/20 1200  TempSrc:   PainSc: 0-No pain      Patients Stated Pain Goal: 3 (50/15/86 8257)  Complications: No complications documented.

## 2020-06-11 DIAGNOSIS — M4316 Spondylolisthesis, lumbar region: Secondary | ICD-10-CM | POA: Diagnosis not present

## 2020-06-11 MED ORDER — METHOCARBAMOL 750 MG PO TABS: 750.0000 mg | ORAL_TABLET | Freq: Four times a day (QID) | ORAL | 0 refills | Status: DC

## 2020-06-11 MED ORDER — OXYCODONE HCL 5 MG PO TABS
5.0000 mg | ORAL_TABLET | Freq: Four times a day (QID) | ORAL | 0 refills | Status: AC | PRN
Start: 2020-06-11 — End: 2020-06-19

## 2020-06-11 NOTE — Evaluation (Signed)
Physical Therapy Evaluation Patient Details Name: Adrian Franco MRN: [id number] DOB: 1945/12/18 Today's Date: 06/11/2020   History of Present Illness  This 74 y.o. male admitted for L4-5 PLIF.  PMH includes:  Sleep apnea, renal insufficiency, h/o PE, DVT, aortic aneurysm. s/p Rt RCR   Clinical Impression  Patient is s/p above surgery resulting in functional limitations. Pt mobilizing well. I have answered all patient's question regarding PT and mobility.   I have encouraged the patient to gradually increase activity daily to tolerance.  Pt feels ready for DC home today.        Follow Up Recommendations No PT follow up    Equipment Recommendations  Other (comment) (pt to use cane at hime with gait for safety)    Recommendations for Other Services       Precautions / Restrictions Precautions Precautions: Back Precaution Booklet Issued: Yes (comment) Precaution Comments: Pt able to state back precautions independently and demonstrates good understanding of them  Required Braces or Orthoses: Spinal Brace (when OOB ) Spinal Brace: Lumbar corset;Applied in sitting position Restrictions Weight Bearing Restrictions: No      Mobility  Bed Mobility Overal bed mobility: Modified Independent             General bed mobility comments: Pt sitting EOB   Transfers Overall transfer level: Modified independent               General transfer comment: able to stand, but does use UE's to push up.    Ambulation/Gait Ambulation/Gait assistance: Modified independent (Device/Increase time) Gait Distance (Feet): 250 Feet Assistive device: None (pt occassionally held to rail in hallways) Gait Pattern/deviations: Step-through pattern;Decreased stride length Gait velocity: decreased from normal   General Gait Details: no balance losses, somewhat gaurded with gait  Stairs Stairs:  (verbally discussed, pt did not want to practice)          Wheelchair Mobility    Modified  Rankin (Stroke Patients Only)       Balance                                             Pertinent Vitals/Pain Pain Assessment: Faces Pain Score: 4  Faces Pain Scale: Hurts little more Pain Location: low back Pain Intervention(s): Monitored during session    Home Living Family/patient expects to be discharged to:: Private residence Living Arrangements: Spouse/significant other Available Help at Discharge: Family Type of Home: House Home Access: Level entry     Home Layout: One level;Other (Comment) (Has basement that he doesn't need to access) Home Equipment: Cane - single point;Other (comment);Bedside commode;Shower seat;Adaptive equipment (walking stick) Additional Comments: Pt lives with wife who has had 6 back surgeries     Prior Function Level of Independence: Independent               Hand Dominance        Extremity/Trunk Assessment   Upper Extremity Assessment Upper Extremity Assessment: Defer to OT eval    Lower Extremity Assessment Lower Extremity Assessment: Grossly WFL    Cervical / Trunk Assessment Cervical / Trunk Assessment: Other exceptions (s/p lumbar fusion )  Communication   Communication: No difficulties  Cognition Arousal/Alertness: Awake/alert Behavior During Therapy: WFL for tasks assessed/performed Overall Cognitive Status: Within Functional Limits for tasks assessed  General Comments General comments (skin integrity, edema, etc.): no family present    Exercises     Assessment/Plan    PT Assessment Patent does not need any further PT services  PT Problem List         PT Treatment Interventions      PT Goals (Current goals can be found in the Care Plan section)  Acute Rehab PT Goals Patient Stated Goal: to go home and get better     Frequency     Barriers to discharge        Co-evaluation               AM-PAC PT "6 Clicks" Mobility   Outcome Measure Help needed turning from your back to your side while in a flat bed without using bedrails?: None Help needed moving from lying on your back to sitting on the side of a flat bed without using bedrails?: A Little Help needed moving to and from a bed to a chair (including a wheelchair)?: None Help needed standing up from a chair using your arms (e.g., wheelchair or bedside chair)?: None Help needed to walk in hospital room?: None Help needed climbing 3-5 steps with a railing? : A Little 6 Click Score: 22    End of Session Equipment Utilized During Treatment: Gait belt;Back brace Activity Tolerance: Patient tolerated treatment well Patient left: in bed;with call bell/phone within reach Nurse Communication: Mobility status PT Visit Diagnosis: Difficulty in walking, not elsewhere classified (R26.2)    Time: 1791-5056 PT Time Calculation (min) (ACUTE ONLY): 18 min   Charges:   PT Evaluation $PT Eval Low Complexity: [place], PT   Acute Rehabilitation Services  Pager [phone] Office [phone] 06/11/2020   Melvern Banker 06/11/2020, 9:39 AM

## 2020-06-11 NOTE — Discharge Summary (Signed)
Physician Discharge Summary  Patient ID: Adrian Franco MRN: [id number] DOB/AGE: Nov 20, 1945 74 y.o.  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 Post op he is voiding, ambulating, and tolerating a regular diet. His wound is clean, dry, and without signs of infection.  He is tolerating a regular diet.   Treatments: surgery: 1. Decompressive lumbar laminectomy hemifacetectomy and foraminotomies bilaterally L4-5 requiring more work than would be required for a simple exposure of the disk for PLIF in order to adequately decompress the neural elements and address the spinal stenosis 2. Posterior lumbar interbody fusion L4-5 using peek interbody cages packed with morcellized allograft and autograft  3. Posterior fixation L4-5 using Alphatec cortical pedicle screws.  4. Intertransverse arthrodesis L4-5 using morcellized autograft and allograft.  Discharge Exam: Blood pressure 122/64, pulse 71, temperature 98.3 F (36.8 C), temperature source Oral, resp. rate 18, height 5\' 8"  (1.727 m), weight 86.2 kg, SpO2 94 %. General appearance: alert, cooperative, appears stated age and mild distress  Disposition: Discharge disposition: 01-Home or Self Care      Spondylolisthesis  Allergies as of 06/11/2020      Reactions   Sulfa Antibiotics Itching   Amoxapine And Related Hives, Rash   Lotensin [benazepril Hcl] Hives, Rash      Medication List    TAKE these medications   acetaminophen 500 MG tablet Commonly known as: TYLENOL Take 1,000 mg by mouth every 6 (six) hours as needed for  mild pain or moderate pain.   ADVANCED COLLAGEN PO Take 1 tablet by mouth daily.   Ambien 10 MG tablet Generic drug: zolpidem Take 10 mg by mouth at bedtime.   diclofenac Sodium 1 % Gel Commonly known as: VOLTAREN Apply 1 application topically 4 (four) times daily as needed (pain).   docusate sodium 100 MG capsule Commonly known as: COLACE Take 100 mg by mouth daily.   fenofibrate 145 MG tablet Commonly known as: TRICOR Take 145 mg by mouth daily.   gabapentin 300 MG capsule Commonly known as: NEURONTIN Take 300 mg by mouth at bedtime as needed (pain).   irbesartan 150 MG tablet Commonly known as: AVAPRO Take 150 mg by mouth daily.   methocarbamol 750 MG tablet Commonly known as: Robaxin-750 Take 1 tablet (750 mg total) by mouth 4 (four) times daily.   omeprazole 40 MG capsule Commonly known as: PRILOSEC Take 40 mg by mouth every evening.   One-A-Day Mens 50+ Advantage Tabs Take 1 tablet by mouth daily with breakfast.   OVER THE COUNTER MEDICATION Take 2 tablets by mouth at bedtime. Cramp Defense otc supplement   oxyCODONE 5 MG immediate release tablet Commonly known as: Oxy IR/ROXICODONE Take 1 tablet (5 mg total) by mouth every 6 (six) hours as needed for up to 8 days for moderate pain ((score 4 to 6)).   rivaroxaban 20 MG Tabs tablet Commonly known as: XARELTO Take 1 tablet (20 mg total) by mouth daily with supper.   rOPINIRole 2 MG tablet Commonly known as: REQUIP Take 2 mg by mouth at bedtime.   sertraline 100 MG tablet Commonly known as: ZOLOFT Take 50 mg by mouth daily.   simvastatin 40 MG  tablet Commonly known as: ZOCOR Take 1 tablet (40 mg total) by mouth daily.   tamsulosin 0.4 MG Caps capsule Commonly known as: FLOMAX Take 0.4 mg by mouth daily.   Testosterone Cypionate 200 MG/ML Soln Inject 200 mg into the muscle every 14 (fourteen) days.   tiZANidine 2 MG tablet Commonly known as: ZANAFLEX Take 2 mg by mouth at bedtime as needed for  muscle spasms.   VITAMIN B COMPLEX PO Take 1 tablet by mouth daily.   VITAMIN C PO Take 1 tablet by mouth daily.   VITAMIN D PO Take 1 tablet by mouth daily.            Durable Medical Equipment  (From admission, onward)         Start     Ordered   06/10/20 1409  DME Walker rolling  Once       Question:  Patient needs a walker to treat with the following condition  Answer:  S/P lumbar fusion   06/10/20 1408   06/10/20 1409  DME 3 n 1  Once        06/10/20 1408          Follow-up Information    Eustace Moore, MD Follow up in 2 week(s).   Specialty: Neurosurgery Why: keep your previously made appointment Contact information: [address] [place] [address] [phone]               Signed: Ashok Pall 06/11/2020, 8:14 AM

## 2020-06-11 NOTE — Progress Notes (Signed)
Patient is discharged from room 3C10 at this time. Alert and in stable condition. IV site d/c'd and instructions read to patient and spouse with understanding verbalized and all questions answered. Left unit via wheelchair with all belongings at side. 

## 2020-06-11 NOTE — Evaluation (Signed)
Occupational Therapy Evaluation Patient Details Name: Adrian Franco MRN: [id number] DOB: 1946/04/08 Today's Date: 06/11/2020    History of Present Illness This 74 y.o. male admitted for L4-5 PLIF.  PMH includes:  Sleep apnea, renal insufficiency, h/o PE, DVT, aortic aneurysm. s/p Rt RCR    Clinical Impression   Patient evaluated by Occupational Therapy with no further acute OT needs identified. All education has been completed and the patient has no further questions. Pt demonstrates good understanding of precautions and safety.  See below for any follow-up Occupational Therapy or equipment needs. OT is signing off. Thank you for this referral.      Follow Up Recommendations  No OT follow up;Supervision - Intermittent    Equipment Recommendations  None recommended by OT    Recommendations for Other Services       Precautions / Restrictions Precautions Precautions: Back Precaution Booklet Issued: Yes (comment) Precaution Comments: Pt able to state back precautions independently and demonstrates good understanding of them  Required Braces or Orthoses: Spinal Brace (when OOB ) Spinal Brace: Lumbar corset;Applied in sitting position Restrictions Weight Bearing Restrictions: No      Mobility Bed Mobility Overal bed mobility: Modified Independent             General bed mobility comments: Pt sitting EOB   Transfers Overall transfer level: Modified independent               General transfer comment: able to stand, but does use UE's to push up.      Balance                                           ADL either performed or assessed with clinical judgement   ADL Overall ADL's : Needs assistance/impaired Eating/Feeding: Independent   Grooming: Wash/dry hands;Wash/dry face;Oral care;Brushing hair;Supervision/safety;Standing Grooming Details (indicate cue type and reason): reviewed safe techniques for brushing teeth and shaving.  He was able  to verbalize understanding  Upper Body Bathing: Supervision/ safety;Sitting;Standing   Lower Body Bathing: Supervison/ safety;Sit to/from stand Lower Body Bathing Details (indicate cue type and reason): able to perform figure 4.  Discussed sitting to shower, vs standing  and use of LH brush Upper Body Dressing : Supervision/safety;Sitting   Lower Body Dressing: Supervision/safety;Sit to/from stand Lower Body Dressing Details (indicate cue type and reason): able to perform figure 4  Toilet Transfer: Supervision/safety;Ambulation;Comfort height toilet   Toileting- Clothing Manipulation and Hygiene: Supervision/safety;Sit to/from stand   Tub/ Banker: Walk-in shower;Supervision/safety   Functional mobility during ADLs: Supervision/safety General ADL Comments: Pt verbalized understanding of how to don/doff brace, and demonstrates good understanding of back precautions      Vision         Perception     Praxis      Pertinent Vitals/Pain Pain Assessment: Faces Pain Score: 4  Faces Pain Scale: Hurts little more Pain Location: low back Pain Intervention(s): Monitored during session     Hand Dominance     Extremity/Trunk Assessment Upper Extremity Assessment Upper Extremity Assessment: Overall WFL for tasks assessed   Lower Extremity Assessment Lower Extremity Assessment: Defer to PT evaluation   Cervical / Trunk Assessment Cervical / Trunk Assessment: Other exceptions (s/p lumbar fusion )   Communication Communication Communication: No difficulties   Cognition Arousal/Alertness: Awake/alert Behavior During Therapy: WFL for tasks assessed/performed Overall Cognitive Status: Within Functional Limits for tasks  assessed                                     General Comments       Exercises     Shoulder Instructions      Home Living Family/patient expects to be discharged to:: Private residence Living Arrangements: Spouse/significant  other Available Help at Discharge: Family Type of Home: House Home Access: Level entry     Home Layout: One level;Other (Comment) (Has basement that he doesn't need to access)     Bathroom Shower/Tub: Tub/shower unit;Walk-in shower   Bathroom Toilet: Handicapped height     Home Equipment: [place] - single point;Other (comment);Bedside commode;Shower seat;Adaptive equipment (walking stick) Adaptive Equipment: Reacher Additional Comments: Pt lives with wife who has had 6 back surgeries       Prior Functioning/Environment Level of Independence: Independent                 OT Problem List: Decreased knowledge of precautions;Pain      OT Treatment/Interventions:      OT Goals(Current goals can be found in the care plan section) Acute Rehab OT Goals Patient Stated Goal: to go home and get better  OT Goal Formulation: All assessment and education complete, DC therapy  OT Frequency:     Barriers to D/C:            Co-evaluation              AM-PAC OT "6 Clicks" Daily Activity     Outcome Measure Help from another person eating meals?: None Help from another person taking care of personal grooming?: A Little Help from another person toileting, which includes using toliet, bedpan, or urinal?: A Little Help from another person bathing (including washing, rinsing, drying)?: A Little Help from another person to put on and taking off regular upper body clothing?: A Little Help from another person to put on and taking off regular lower body clothing?: A Little 6 Click Score: 19   End of Session Equipment Utilized During Treatment: Back brace Nurse Communication: Mobility status  Activity Tolerance: Patient tolerated treatment well Patient left: in bed;with call bell/phone within reach (EOB)  OT Visit Diagnosis: Pain Pain - part of body:  (back )                Time: 3149-7026 OT Time Calculation (min): 10 min Charges:  OT General Charges $OT Visit: 1 Visit OT  Evaluation $OT Eval Low Complexity: 1 Low  Nilsa Nutting., OTR/L Acute Rehabilitation Services Pager [phone] Office [phone]   Lucille Passy M 06/11/2020, 9:21 AM

## 2020-06-11 NOTE — Discharge Instructions (Signed)

## 2020-06-13 NOTE — Anesthesia Postprocedure Evaluation (Signed)
Anesthesia Post Note  Patient: Adrian Franco  Procedure(s) Performed: Posterior Lateral Interbody Fusion - Lumbar Four-Lumbar Five (N/A Back)     Patient location during evaluation: PACU Anesthesia Type: General Level of consciousness: awake and alert Pain management: pain level controlled Vital Signs Assessment: post-procedure vital signs reviewed and stable Respiratory status: spontaneous breathing, nonlabored ventilation, respiratory function stable and patient connected to nasal cannula oxygen Cardiovascular status: blood pressure returned to baseline and stable Postop Assessment: no apparent nausea or vomiting Anesthetic complications: no   No complications documented.  Last Vitals:  Vitals:   06/11/20 0505 06/11/20 0729  BP: 120/65 122/64  Pulse: 61 71  Resp: 20 18  Temp: 36.5 C 36.8 C  SpO2: 95% 94%    Last Pain:  Vitals:   06/11/20 1032  TempSrc:   PainSc: 3                  Laker Thompson

## 2020-07-01 ENCOUNTER — Other Ambulatory Visit: Payer: Self-pay

## 2020-07-01 ENCOUNTER — Ambulatory Visit (HOSPITAL_COMMUNITY)
Admission: RE | Admit: 2020-07-01 | Discharge: 2020-07-01 | Disposition: A | Discharge status: Home / Self Care | Payer: Medicare Other | Source: Ambulatory Visit | Attending: Cardiovascular Disease | Admitting: Cardiovascular Disease

## 2020-07-01 DIAGNOSIS — I712 Thoracic aortic aneurysm, without rupture, unspecified: Secondary | ICD-10-CM

## 2020-07-01 MED ORDER — IOHEXOL 350 MG/ML SOLN
80.0000 mL | Freq: Once | INTRAVENOUS | Status: AC | PRN
  Administered 2020-07-01: 80 mL via INTRAVENOUS

## 2020-07-04 ENCOUNTER — Telehealth: Payer: Self-pay | Admitting: Cardiovascular Disease

## 2020-07-04 NOTE — Telephone Encounter (Signed)
The patient has been notified of the result and verbalized understanding.  All questions (if any) were answered. Ewell Poe [place], RN 07/04/2020 3:39 PM

## 2020-07-04 NOTE — Telephone Encounter (Signed)
Patient returning call for CT results. 

## 2020-07-08 NOTE — Telephone Encounter (Signed)
This message was taken care of through Patient's clearance encounter.

## 2020-07-12 ENCOUNTER — Ambulatory Visit (INDEPENDENT_AMBULATORY_CARE_PROVIDER_SITE_OTHER): Payer: Medicare Other | Admitting: Neurology

## 2020-07-12 DIAGNOSIS — Z4549 Encounter for adjustment and management of other implanted nervous system device: Secondary | ICD-10-CM

## 2020-07-12 DIAGNOSIS — G4733 Obstructive sleep apnea (adult) (pediatric): Secondary | ICD-10-CM

## 2020-07-19 DIAGNOSIS — Z4549 Encounter for adjustment and management of other implanted nervous system device: Secondary | ICD-10-CM | POA: Insufficient documentation

## 2020-07-19 NOTE — Progress Notes (Signed)
DIAGNOSIS  1. Primarily Obstructive Sleep Apnea responded to Inspire at a  final voltage of 1.8V.  2. 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    PLANS/RECOMMENDATIONS: the patient will be instructed to advance  the settings of the INSPIRE device gradually in 0.2V increments  over the coming weeks.  An Inspire representative will be following this patient.   DISCUSSION:  A routine follow up appointment will be scheduled in the Sleep  Clinic at [facility] after 6 month and yearly  thereafter. Please call [phone] with any questions.

## 2020-07-19 NOTE — Procedures (Signed)
PATIENT'S NAME:  Eian, Vandervelden DOB:      27-Feb-1946      MR#:    [id number]     DATE OF RECORDING: 07/12/2020 [facility] REFERRING M.D.:  Hessie Knows, MD Study Performed:   Inspire Titration HISTORY:  Mr. Hendricks Schwandt.Loman was implanted with an INSPIRE device, serial number # [id number], on 02-17-2020, by Dr. Redmond Baseman.  The inspire device was activated on 03-31-2020, following a baseline AHI was 36.7/h.  Now returning to sleep lab for fine tuning adjustment of inspire sleep device. Incoming setting of 1.2V.   The patient endorsed the Epworth Sleepiness Scale at 7 points.   The patient's weight 194 pounds with a height of 69 (inches), resulting in a BMI of 28.7 kg/m2. The patient's neck circumference measured 17 inches.  CURRENT MEDICATIONS: Tylenol, Vitamin C, Vitamin B, Calcium PO, Colace, Tricor, Avapro, Mag-ox, Nitrostat, Prilosec, Xarelto, Requip, Zoloft, Zocor, Flomax, Vitamin D, Ambien, Testosterone cypionate.    PROCEDURE:  This is a multichannel digital polysomnogram utilizing the SomnoStar 11.2 system.  Electrodes and sensors were applied and monitored per AASM Specifications.   EEG, EOG, Chin and Limb EMG, were sampled at 200 Hz.  ECG, Snore and Nasal Pressure, Thermal Airflow, Respiratory Effort, CPAP Flow and Pressure, Oximetry was sampled at 50 Hz. Digital video and audio were recorded.       Inspire was initiated at 1.0 V ( 0.2 V below the already achived setting at baseline) and titrated after the patient fell asleep, step by step , to a final 1.8V where a therapeutic amplitude was found.  AHI was 8/h.  The patient then returned home with the same setting and voltage that he arrived with, at 1.2V, and was advised that the final setting will be achieved over the coming weeks.  Lights Out was at 22:10 and Lights On at 04:39. Total recording time (TRT) was 376.5 minutes, with a total sleep time (TST) of 327.5 minutes. The patient's sleep latency was 0 minutes. REM latency was 133 minutes.   The sleep efficiency was 87.1%.    SLEEP ARCHITECTURE: WASO (Wake after sleep onset) was 43.5 minutes.  There were 77 minutes in Stage N1, 142.5 minutes Stage N2, 60.5 minutes Stage N3 and 52 minutes in Stage REM.  The percentage of Stage N1 was 23.2%, Stage N2 was 42.9%, Stage N3 was 18.2% and Stage R (REM sleep) was 15.7%. The sleep architecture was normal for age.  RESPIRATORY ANALYSIS:  There was a total of 19 respiratory events: 0 obstructive apneas, 0 central apneas and 0 mixed apneas with a total of 0 apneas and an apnea index (AI) of 0 /hour. There were 19 hypopneas with a hypopnea index of 3.5/hour.   The total APNEA/HYPOPNEA INDEX  (AHI) was 3.5 /hour . 12 events occurred in REM sleep and 7 events in NREM. The REM AHI was 13.8 /hour versus a non-REM AHI of 1.5 /hour.  The patient spent 263 minutes of total sleep time in the supine position and 69 minutes in non-supine. The supine AHI was 4.2, versus a non-supine AHI of 0.9/h.  OXYGEN SATURATION & C02:  The baseline 02 saturation was 0%, with the lowest being 71%. Time spent below 89% saturation equaled 7 minutes.  The arousals were noted as: 80 were spontaneous, 0 were associated with PLMs, only 3 were associated with respiratory events. The patient had a total of 0 Periodic Limb Movements.   Audio and video analysis did not show any abnormal or unusual movements,  behaviors, phonations or vocalizations.   Soft Snoring was still noted. EKG was in keeping with normal sinus rhythm (NSR).  DIAGNOSIS 1. Primarily Obstructive Sleep Apnea responded to Inspire at a final voltage of 1.8V.  2. 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    PLANS/RECOMMENDATIONS: the patient will be instructed to advance the settings of the INSPIRE device gradually in 0.2V increments over the coming weeks. An Inspire representative will be following this patient     DISCUSSION: A routine follow up appointment will be scheduled in the  Sleep Clinic at [facility] after 6 month and yearly thereafter.  Please call [phone] with any questions.      I certify that I have reviewed the entire raw data recording prior to the issuance of this report in accordance with the Standards of Accreditation of the American Academy of Sleep Medicine (AASM)    Larey Seat, M.D. Diplomat, Tax adviser of Psychiatry and Neurology  Diplomat, Tax adviser of Sleep Medicine Market researcher, [employer] [facility] at [employer]

## 2020-07-27 ENCOUNTER — Encounter: Payer: Self-pay | Admitting: Neurology

## 2020-07-27 ENCOUNTER — Ambulatory Visit (INDEPENDENT_AMBULATORY_CARE_PROVIDER_SITE_OTHER): Payer: Medicare Other | Admitting: Neurology

## 2020-07-27 VITALS — BP 112/68 | HR 71 | Ht 68.0 in | Wt 196.0 lb

## 2020-07-27 DIAGNOSIS — G2581 Restless legs syndrome: Secondary | ICD-10-CM

## 2020-07-27 DIAGNOSIS — Z4549 Encounter for adjustment and management of other implanted nervous system device: Secondary | ICD-10-CM

## 2020-07-27 DIAGNOSIS — Z789 Other specified health status: Secondary | ICD-10-CM

## 2020-07-27 DIAGNOSIS — I82563 Chronic embolism and thrombosis of calf muscular vein, bilateral: Secondary | ICD-10-CM | POA: Diagnosis not present

## 2020-07-27 DIAGNOSIS — G4733 Obstructive sleep apnea (adult) (pediatric): Secondary | ICD-10-CM | POA: Diagnosis not present

## 2020-07-27 MED ORDER — ZOLPIDEM TARTRATE 10 MG PO TABS: 5.0000 mg | ORAL_TABLET | Freq: Every evening | ORAL | 2 refills | Status: DC | PRN

## 2020-07-27 NOTE — Progress Notes (Signed)
SLEEP MEDICINE CLINIC    Provider:  Larey Seat, MD  Primary Care Physician:  [name], [place] [address]     Referring Provider: Dr. Redmond Baseman, MD ENT- [phone]           Chief Complaint according to patient   Patient presents with:    . New Patient (Initial Visit)     INSPIRE-       07-27-2020: [name] returns today on 07-27-20 for a follow-up visit after his inspire in lab titration.  Incoming setting at the time was 1.2 V.  Sleep efficiency was great at 87.1% the patient's apnea index remained at 3.5/h over the night however he responded best to a final voltage of 1.8 V.  With] [residual apneas occurred only in supine sleep. The wife reports today he still snores, and loud when in supine. Wife has supine dependent apnea, very mild. He is now approaching level 2, 1.3 Volt after today's re- adjustment. He was instructed to slowly go up to 1.8V.  So his amplitude today as he leaves here will be 1.3 V lower limit 1.2 upper limit 1.9 V he has a start delay or ramp time of 30 minutes he has a pause time of 15 minutes which she uses frequently since he still has some nocturia breaks, therapy duration is 9 hours.  He is status post COVID 19 infect in Nov 2020- fully vaccinated.     03-31-2020 activation visit for INSPIRE-   [name], born on June 01, 1946 presented today for inspire therapy session and activation following implantation.  The amplitude was now set from 0 to 0.6 V, patient control will be between 0.6 and 1.6 V pulse width and microseconds state at 90 s before this visit and after the rate of stimulation and hertz remained at 33 Hz before and after pause time at 15 minutes start delay at 30 minutes.  Therapy duration was set from 8 to 9 hours nocturnal stimulation with automatic check of based on the patient's reported sleep time.  Sensing for exhalation will be 7 minutes for 2-1 and has stayed at this level  inhalation from zero to +1 and has stayed at this level.  Off.  38% alternating with thirteen has stayed at this level maximum stimulation time in seconds is 4 seconds and has remained there is no inversion.  Initial activation and stimulation settings have changed for amplitude, patient control and therapy duration only tongue motion was witnessed and was reported as comfortable.  The generator serial number is air [id number].  The testing of waveforms showed excellent response.  The patient will be fully titrated to inspire during a sleep study to find the goal setting.     HISTORY OF PRESENT ILLNESS:  Adrian Franco is a 74  year old Caucasian male patient seen here  In the company of his wife. He is a Forensic psychologist. Seen upon referral on 07/27/2020 from Dr Redmond Baseman, MD .  Chief concern according to patient :   I have the pleasure of seeing Adrian Franco today, a right -handed White or Caucasian male with a possible sleep disorder.  he   has a past medical history of Anxiety, Aortic aneurysm ([place]), Atypical chest pain, BPH (benign prostatic hypertrophy), Chronic back pain, Depression, DVT (deep venous thrombosis) ([place]), GERD (gastroesophageal reflux disease), History of kidney stones, HTN (hypertension), Hyperlipemia, Hypogonadism in male, Insomnia, Localized edema, PE (  pulmonary embolism), Renal insufficiency, Restless leg syndrome, Sleep apnea, SOB (shortness of breath), and Testicular hypofunction.. He had been infected with COVID 19 in November , and recovered after 10 days of severe illness, his sense of taste and smell have returned.    The patient had the first sleep study in the year 2005, and was diagnosed with apnea.  He has been using an auto titration CPAP machine and believes the average percentile is about 10 or 11 cm pressure.  He would like to be evaluated for alternatives to CPAP use and is interested in the inspire technology.  He presented to Dr. Hessie Knows on 7 December  and is described as a 74 year old male patient with a chief complaint of CPAP use for sleep apnea for the last 10 to 12 years he has noticed a benefit when using CPAP but he has never felt that the mask is fitting him well.  He has been on a full facemask which allows more air leakage and the machine wakes him up from air leaking and he feels at times also smothered. Tried nasal mask and pillows, was snoring loudly.  His machine makes a puffing sounds.  If he can sleep with CPAP, he feels good the next day.   He didn't bring his CPAP nor his wife's to this appointment.  PS: The arousals were noted as: 75 were spontaneous, 26 were  associated with PLMs, 119 were associated with respiratory  events.  The patient had a Periodic Limb Movement (PLM) index of 44.6 and  the PLM Arousal index was 5.8/hour.  He reported leg cramping, causing prolonged wakefulness. Audio  and video analysis did not show any abnormal or unusual  movements, behaviors, phonations or vocalizations during his  sleep.   Nocturia times one. Snoring was noted.  EKG was in keeping with sinus rhythm, but bradycardic.   IMPRESSION:  1. Given the severe degree of Obstructive Sleep Apnea (OSA) with  an AHI of 36.7/h, the associated hypoxia ( 69 minutes of total  sleep time ) and supine exacerbation to AHI of 61/h, treatment of  sleep apnea is a must.  2. 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  3. Primary Snoring.  4. Poor sleep quality and efficiency.    RECOMMENDATIONS:   1. I would usually advise a full-night, attended, PAP titration  study to optimize therapy. given that he feels better when PAP is  used, yet cannot longer tolerate CPAP, a change to inspire  therapy is indicated.  2. I will let Dr Redmond Baseman know that the indication for INSPIRE  therapy is given.     03-31-2020: Activation procedue for inspire, Implantation  was on 02-17-2020. Hypoglossal CN stimulator-    Review of Systems: Out of a complete 14 system review, the patient complains of only the following symptoms, and all other reviewed systems are negative.:  Fatigue, sleepiness , still some snoring,  fragmented sleep, nocturia - 2 times.  RLS:  He had DVT in both legs- veins are knobbly- support socks help- try ropinorol 4 mg at night. On neurontin, too.    RLS - the urge to move - between knee and foot bilaterally, left most severe.      How likely are you to doze in the following situations: 0 = not likely, 1 = slight chance, 2 = moderate chance, 3 = high chance   Sitting and Reading? Watching Television?  Sitting inactive in a public place (theater or meeting)? As a passenger in a car for an hour without a break? Lying down in the afternoon when circumstances permit? Sitting and talking to someone? Sitting quietly after lunch without alcohol? In a car, while stopped for a few minutes in traffic?   Total = 5/ 24 points   FSS endorsed at 24/ 63 points.   Social History   Socioeconomic History  . Marital status: Married    Spouse name: Not on file  . Number of children: Not on file  . Years of education: Not on file  . Highest education level: Not on file  Occupational History  . Not on file  Tobacco Use  . Smoking status: Never Smoker  . Smokeless tobacco: Never Used  Vaping Use  . Vaping Use: Never used  Substance and Sexual Activity  . Alcohol use: No    Alcohol/week: 0.0 standard drinks  . Drug use: No  . Sexual activity: Yes    Comment: married  Other Topics Concern  . Not on file  Social History Narrative  . Not on file   Social Determinants of Health   Financial Resource Strain:   . Difficulty of Paying Living Expenses: Not on file  Food Insecurity:   . Worried About Charity fundraiser in the Last Year: Not on file  . Ran Out of Food in the Last Year: Not on file  Transportation Needs:   . Lack of  Transportation (Medical): Not on file  . Lack of Transportation (Non-Medical): Not on file  Physical Activity:   . Days of Exercise per Week: Not on file  . Minutes of Exercise per Session: Not on file  Stress:   . Feeling of Stress : Not on file  Social Connections:   . Frequency of Communication with Friends and Family: Not on file  . Frequency of Social Gatherings with Friends and Family: Not on file  . Attends Religious Services: Not on file  . Active Member of Clubs or Organizations: Not on file  . Attends Archivist Meetings: Not on file  . Marital Status: Not on file    Family History  Problem Relation Age of Onset  . Hyperlipidemia Mother   . Hyperlipidemia Father   . Heart attack Father   . Healthy Son   . Healthy Brother   . Other Brother   . Healthy Sister   . Healthy Son     Past Medical History:  Diagnosis Date  . Anxiety   . Aortic aneurysm ([place])    followed by Dr Johnsie Cancel  . Atypical chest pain   . BPH (benign prostatic hypertrophy)   . Chronic back pain   . Depression   . DVT (deep venous thrombosis) ([place])   . GERD (gastroesophageal reflux disease)   . History of kidney stones   . HTN (hypertension)   . Hyperlipemia   . Hypogonadism in male   . Insomnia   . Localized edema   . PE (pulmonary embolism)   . Renal insufficiency   . Restless leg syndrome   . Sleep apnea    severe OSA-cannot tolerate CPAP  . SOB (shortness of breath)   . Testicular hypofunction     Past Surgical History:  Procedure Laterality Date  . DRUG INDUCED ENDOSCOPY N/A 12/09/2019   Procedure: DRUG INDUCED ENDOSCOPY;  Surgeon: Melida Quitter, MD;  Location: [facility];  Service: ENT;  Laterality: N/A;  . IMPLANTATION  OF HYPOGLOSSAL NERVE STIMULATOR Right 02/17/2020   Procedure: IMPLANTATION OF HYPOGLOSSAL NERVE STIMULATOR;  Surgeon: Melida Quitter, MD;  Location: [facility];  Service: ENT;  Laterality: Right;  . INGUINAL HERNIA REPAIR    . KIDNEY STONE  SURGERY     stent  . ROTATOR CUFF REPAIR Right      Current Outpatient Medications on File Prior to Visit  Medication Sig Dispense Refill  . acetaminophen (TYLENOL) 500 MG tablet Take 1,000 mg by mouth every 6 (six) hours as needed for mild pain or moderate pain.     . Ascorbic Acid (VITAMIN C PO) Take 1 tablet by mouth daily.    . B Complex Vitamins (VITAMIN B COMPLEX PO) Take 1 tablet by mouth daily.    . diclofenac Sodium (VOLTAREN) 1 % GEL Apply 1 application topically 4 (four) times daily as needed (pain).    Marland Kitchen docusate sodium (COLACE) 100 MG capsule Take 100 mg by mouth daily.     . fenofibrate (TRICOR) 145 MG tablet Take 145 mg by mouth daily.    Marland Kitchen gabapentin (NEURONTIN) 300 MG capsule Take 300 mg by mouth at bedtime as needed (pain).     . irbesartan (AVAPRO) 150 MG tablet Take 150 mg by mouth daily.    . methocarbamol (ROBAXIN-750) 750 MG tablet Take 1 tablet (750 mg total) by mouth 4 (four) times daily. 60 tablet 0  . Multiple Vitamins-Minerals (ONE-A-DAY MENS 50+ ADVANTAGE) TABS Take 1 tablet by mouth daily with breakfast.    . omeprazole (PRILOSEC) 40 MG capsule Take 40 mg by mouth every evening.     Marland Kitchen OVER THE COUNTER MEDICATION Take 2 tablets by mouth at bedtime. Cr[employer] otc supplement    . rivaroxaban (XARELTO) 20 MG TABS tablet Take 1 tablet (20 mg total) by mouth daily with supper. 90 tablet 3  . rOPINIRole (REQUIP) 2 MG tablet Take 2 mg by mouth at bedtime.     . sertraline (ZOLOFT) 100 MG tablet Take 50 mg by mouth daily.     . simvastatin (ZOCOR) 40 MG tablet Take 1 tablet (40 mg total) by mouth daily. 90 tablet 3  . Specialty Vitamins Products (ADVANCED COLLAGEN PO) Take 1 tablet by mouth daily.    . tamsulosin (FLOMAX) 0.4 MG CAPS capsule Take 0.4 mg by mouth daily.     . Testosterone Cypionate 200 MG/ML SOLN Inject 200 mg into the muscle every 14 (fourteen) days.     Marland Kitchen tiZANidine (ZANAFLEX) 2 MG tablet Take 2 mg by mouth at bedtime as needed for muscle spasms.      Marland Kitchen VITAMIN D PO Take 2 tablets by mouth daily. Bid    . zolpidem (AMBIEN) 10 MG tablet Take 10 mg by mouth at bedtime.      No current facility-administered medications on file prior to visit.    Allergies  Allergen Reactions  . Sulfa Antibiotics Itching  . Amoxapine And Related Hives and Rash  . Lotensin [Benazepril Hcl] Hives and Rash    Physical exam:  Today's Vitals   07/27/20 0932  BP: 112/68  Pulse: 71  Weight: 196 lb (88.9 kg)  Height: 5\' 8"  (1.727 m)   Body mass index is 29.8 kg/m.   Wt Readings from Last 3 Encounters:  07/27/20 196 lb (88.9 kg)  06/10/20 190 lb (86.2 kg)  06/08/20 197 lb (89.4 kg)     Ht Readings from Last 3 Encounters:  07/27/20 5\' 8"  (1.727 m)  06/10/20 5\' 8"  (1.727  m)  06/08/20 5\' 8"  (1.727 m)      General: The patient is awake, alert and appears not in acute distress. The patient is well groomed. Head: Normocephalic, atraumatic. Neck is supple. Mallampati 3  neck circumference:17 inches . Nasal airflow congested -   Retrognathia is seen.  Dental status:  Cardiovascular:  Regular rate and cardiac rhythm by pulse,  without distended neck veins. Respiratory: Lungs are clear to auscultation.  Skin:  Without evidence of ankle edema, or rash. Trunk: The patient's posture is erect.   Neurologic exam : The patient is awake and alert, oriented to place and time.   Memory subjective described as intact.  Attention span & concentration ability appears normal.  Speech is fluent,  without  dysarthria, dysphonia or aphasia.  Mood and affect are appropriate.   Cranial nerves: no loss of smell or taste reported - recovered by December 2020 Pupils are equal and briskly reactive to light. Funduscopic exam deferred.   Extraocular movements in vertical and horizontal planes were intact and without nystagmus. No Diplopia. Visual fields by finger perimetry are intact. Hearing was intact to soft voice and finger rubbing.    Facial sensation intact to  fine touch. Facial motor strength is symmetric and tongue and uvula move midline.  Neck ROM : rotation, tilt and flexion extension were normal for age and shoulder shrug was symmetrical.    Motor exam:  Symmetric bulk, tone and ROM.   Normal tone without cog wheeling, symmetric grip strength .   Sensory:  Fine touch, pinprick and vibration were tested  and  normal.  Proprioception tested in the upper extremities was normal. Coordination: Rapid alternating movements in the fingers/hands were of normal speed.  The Finger-to-nose maneuver was intact without evidence of ataxia, dysmetria , he has bilaterally rtrremors at rest.    Gait and station: Patient could rise unassisted from a seated position, walked without assistive device.  Stance is of normal width/ base and the patient turned with 4 steps.  Toe and heel walk were deferred.  Deep tendon reflexes: in the upper and lower extremities are symmetric and intact.  Babinski response was deferred.     ASSESSMENT:     OSA :Inspire- intolerant to CPAP_  After spending a total time of 25 minutes face to face and with initial stimulation settings to this device.   RLS:  He had DVT in both legs- veins are knobbly- support socks help- try ropinorol 4 mg at night. On neurontin, too. He take magnesium already- I think this is DVT related,not iron deficiency.  I asked him to try tonic water at niht, one glass.   I plan to follow up in another 8-12 month.   Electronically signed by: Larey Seat, MD 07/27/2020 9:52 AM  [facility] Board certified by The [employer] of Sleep Medicine and Diplomate of the [employer] of Sleep Medicine. Board certified In Neurology through the [employer], Fellow of the [employer] of Neurology. Medical Director of [facility].

## 2020-08-31 NOTE — Progress Notes (Signed)
Patient location : Home Physician location : Office   Cardiology Office Note    Date:  09/06/2020   ID:  COE ANGELOS, DOB 09-07-1945, MRN [id number]  PCP:  [name], MD  Cardiologist: [name]   CC:  Follow up  History of Present Illness:   75 y.o. with history of HLD, HTN, CKD baseline Cr around 1.5. Diagnosed with submassive PE in August of 2017. Seen by Dr Donzetta Matters [facility] and ? If event ppt by testosterone supplements. 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 Anxiety   . Aortic aneurysm ([place])    followed by Dr Johnsie Cancel  . Atypical chest pain   . BPH (benign prostatic hypertrophy)   . Chronic back pain   . Depression   . DVT (deep venous thrombosis) ([place])   . GERD (gastroesophageal reflux disease)   . History of kidney stones   . HTN (hypertension)   . Hyperlipemia   .  Hypogonadism in male   . Insomnia   . Localized edema   . PE (pulmonary embolism)   . Renal insufficiency   . Restless leg syndrome   . Sleep apnea    severe OSA-cannot tolerate CPAP  . SOB (shortness of breath)   . Testicular hypofunction     Past Surgical History:  Procedure Laterality Date  . DRUG INDUCED ENDOSCOPY N/A 12/09/2019   Procedure: DRUG INDUCED ENDOSCOPY;  Surgeon: Melida Quitter, MD;  Location: [place];  Service: ENT;  Laterality: N/A;  . IMPLANTATION OF HYPOGLOSSAL NERVE STIMULATOR Right 02/17/2020   Procedure: IMPLANTATION OF HYPOGLOSSAL NERVE STIMULATOR;  Surgeon: Melida Quitter, MD;  Location: [place];  Service: ENT;  Laterality: Right;  . INGUINAL HERNIA REPAIR    . KIDNEY STONE SURGERY     stent  . ROTATOR CUFF REPAIR Right     Current Medications: Outpatient Medications Prior to Visit  Medication Sig Dispense Refill  . acetaminophen (TYLENOL) 500 MG tablet Take 1,000 mg by mouth every 6 (six) hours as needed for mild pain or moderate pain.     . Ascorbic Acid (VITAMIN C PO) Take 1 tablet by mouth daily.    . B Complex Vitamins (VITAMIN B COMPLEX PO) Take 1 tablet by mouth daily.    Marland Kitchen  diclofenac Sodium (VOLTAREN) 1 % GEL Apply 1 application topically 4 (four) times daily as needed (pain).    Marland Kitchen docusate sodium (COLACE) 100 MG capsule Take 100 mg by mouth daily.    . fenofibrate (TRICOR) 145 MG tablet Take 145 mg by mouth daily.    Marland Kitchen gabapentin (NEURONTIN) 300 MG capsule Take 300 mg by mouth at bedtime as needed (pain).     . irbesartan (AVAPRO) 150 MG tablet Take 150 mg by mouth daily.    . methocarbamol (ROBAXIN-750) 750 MG tablet Take 1 tablet (750 mg total) by mouth 4 (four) times daily. 60 tablet 0  . Multiple Vitamins-Minerals (ONE-A-DAY MENS 50+ ADVANTAGE) TABS Take 1 tablet by mouth daily with breakfast.    . omeprazole (PRILOSEC) 40 MG capsule Take 40 mg by mouth every evening.     . rivaroxaban (XARELTO) 20 MG TABS tablet Take 1 tablet  (20 mg total) by mouth daily with supper. 90 tablet 3  . rOPINIRole (REQUIP) 2 MG tablet Take 2 mg by mouth at bedtime.     . sertraline (ZOLOFT) 100 MG tablet Take 50 mg by mouth daily.     . simvastatin (ZOCOR) 40 MG tablet Take 1 tablet (40 mg total) by mouth daily. 90 tablet 3  . Specialty Vitamins Products (ADVANCED COLLAGEN PO) Take 1 tablet by mouth daily.    . tamsulosin (FLOMAX) 0.4 MG CAPS capsule Take 0.4 mg by mouth daily.     . Testosterone Cypionate 200 MG/ML SOLN Inject 200 mg into the muscle every 14 (fourteen) days.     Marland Kitchen tiZANidine (ZANAFLEX) 2 MG tablet Take 2 mg by mouth at bedtime as needed for muscle spasms.     Marland Kitchen VITAMIN D PO Take 2 tablets by mouth daily. Bid    . zolpidem (AMBIEN) 10 MG tablet Take 0.5-1 tablets (5-10 mg total) by mouth at bedtime as needed for sleep. 30 tablet 2  . OVER THE COUNTER MEDICATION Take 2 tablets by mouth at bedtime. [employer] otc supplement     No facility-administered medications prior to visit.     Allergies:   Sulfa antibiotics, Amoxapine and related, and Lotensin [benazepril hcl]   Social History   Socioeconomic History  . Marital status: Married    Spouse name: Not on file  . Number of children: Not on file  . Years of education: Not on file  . Highest education level: Not on file  Occupational History  . Not on file  Tobacco Use  . Smoking status: Never Smoker  . Smokeless tobacco: Never Used  Vaping Use  . Vaping Use: Never used  Substance and Sexual Activity  . Alcohol use: No    Alcohol/week: 0.0 standard drinks  . Drug use: No  . Sexual activity: Yes    Comment: married  Other Topics Concern  . Not on file  Social History Narrative  . Not on file   Social Determinants of Health   Financial Resource Strain: Not on file  Food Insecurity: Not on file  Transportation Needs: Not on file  Physical Activity: Not on file  Stress: Not on file  Social Connections: Not on file     Family History:  The  patient'sfamily history includes Healthy in his brother, sister, son, and son; Heart attack in his father; Hyperlipidemia in his father and mother; Other in his brother.     ROS:   Please see the history of present illness.    ROS All other systems  reviewed and are negative.   PHYSICAL EXAM:   VS:  BP 130/82   Pulse 66   Ht 5\' 8"  (1.727 m)   Wt 92.5 kg   SpO2 95%   BMI 31.02 kg/m     Affect appropriate Healthy:  appears stated age 21: normal Neck supple with no adenopathy JVP normal no bruits no thyromegaly Lungs clear with no wheezing and good diaphragmatic motion Heart:  S1/S2 AR murmur, no rub, gallop or click PMI normal Abdomen: benighn, BS positve, no tenderness, no AAA no bruit.  No HSM or HJR Distal pulses intact with no bruits No edema Neuro non-focal Skin warm and dry No muscular weakness   Wt Readings from Last 3 Encounters:  09/06/20 92.5 kg  07/27/20 88.9 kg  06/10/20 86.2 kg      Studies/Labs Reviewed:   EKG:  SR rate 51 PR 238 normal otherwise   Recent Labs: 06/08/2020: BUN 16; Creatinine, Ser 1.42; Hemoglobin 13.9; Platelets 240; Potassium 4.3; Sodium 138   Lipid Panel No results found for: CHOL, TRIG, HDL, CHOLHDL, VLDL, LDLCALC, LDLDIRECT  Additional studies/ records that were reviewed today include:  04/05/16 Study Highlights    Nuclear stress EF: 55%.  Blood pressure demonstrated a hypertensive response to exercise.  There was no ST segment deviation noted during stress.  No T wave inversion was noted during stress.  The study is normal.  This is a low risk study.  The left ventricular ejection fraction is normal (55-65%).     Echo 10/22/19 EF 55-60% normal size Mild to moderate AR  Aortic root 4.5 cm   Echo 09/06/20  EF 55-60%  Moderate AR   ASSESSMENT & PLAN:   DVT/PE: f/u Dr Donzetta Matters [facility] duplex  Discharge summary from hospital called this a provoked DVT from testosterone replacement but Dr. Donzetta Matters called it unprovoked.  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 I reviewed his echo, he has a trileaflet valve.moderate  AR with root 4.5 cm f/u echo ordered  For January 2023   No change in murmur    CKD: Cr about 1.5    Low T: he and wife are worried that he will feel badly off his supplementation. I am not sure if this was the inciting cause of his DVT/PE, but if he does end up feeling badly off the testosterone therapy and his PCP thinks he needs it long term, he may need to continue his oral anticoagulation.  Aortic Aneurysm :   Cardiac CTA 11/17/19 Aorta 4.2 cm  F/U CT done 07/02/20 stable at same dimension   CAD:  11/17/19  Calcium score 329 , 60 th percentile for age Moderate calcified stenosis in ostial/proximal LAD and ostial RCA FFR CT negative continue medical Rx  FFR CT in LAD was 0.84 will need to follow closely     F/U in a year   Signed, Jenkins Rouge, MD  09/06/2020 2:09 PM    [facility] [address], [place], [place]  [address] Phone: [phone]; Fax: [phone]

## 2020-09-06 ENCOUNTER — Other Ambulatory Visit: Payer: Self-pay

## 2020-09-06 ENCOUNTER — Ambulatory Visit (HOSPITAL_COMMUNITY): Payer: Medicare Other | Attending: Cardiovascular Disease

## 2020-09-06 ENCOUNTER — Encounter: Payer: Self-pay | Admitting: Cardiovascular Disease

## 2020-09-06 ENCOUNTER — Ambulatory Visit (INDEPENDENT_AMBULATORY_CARE_PROVIDER_SITE_OTHER): Payer: Medicare Other | Admitting: Cardiovascular Disease

## 2020-09-06 VITALS — BP 130/82 | HR 66 | Ht 68.0 in | Wt 204.0 lb

## 2020-09-06 DIAGNOSIS — I10 Essential (primary) hypertension: Secondary | ICD-10-CM | POA: Diagnosis present

## 2020-09-06 DIAGNOSIS — I351 Nonrheumatic aortic (valve) insufficiency: Secondary | ICD-10-CM | POA: Diagnosis present

## 2020-09-06 DIAGNOSIS — E785 Hyperlipidemia, unspecified: Secondary | ICD-10-CM | POA: Diagnosis present

## 2020-09-06 LAB — ECHOCARDIOGRAM COMPLETE
Area-P 1/2: 2.91 cm2
S' Lateral: 3.2 cm

## 2020-09-06 NOTE — Patient Instructions (Addendum)
Medication Instructions:  *If you need a refill on your cardiac medications before your next appointment, please call your pharmacy*  Lab Work: If you have labs (blood work) drawn today and your tests are completely normal, you will receive your results only by: Marland Kitchen MyChart Message (if you have MyChart) OR . A paper copy in the mail If you have any lab test that is abnormal or we need to change your treatment, we will call you to review the results.  Testing/Procedures: Your physician has requested that you have an echocardiogram in 1 year. Echocardiography is a painless test that uses sound waves to create images of your heart. It provides your doctor with information about the size and shape of your heart and how well your heart's chambers and valves are working. This procedure takes approximately one hour. There are no restrictions for this procedure.   Follow-Up: At [facility], you and your health needs are our priority.  As part of our continuing mission to provide you with exceptional heart care, we have created designated Provider Care Teams.  These Care Teams include your primary Cardiologist (physician) and Advanced Practice Providers (APPs -  Physician Assistants and Nurse Practitioners) who all work together to provide you with the care you need, when you need it.  We recommend signing up for the patient portal called "MyChart".  Sign up information is provided on this After Visit Summary.  MyChart is used to connect with patients for Virtual Visits (Telemedicine).  Patients are able to view lab/test results, encounter notes, upcoming appointments, etc.  Non-urgent messages can be sent to your provider as well.   To learn more about what you can do with MyChart, go to [email or web address].    Your next appointment:   12 month(s)  The format for your next appointment:   In Person  Provider:   You may see Jenkins Rouge, MD or one of the following Advanced Practice Providers on  your designated Care Team:    Truitt Merle, NP  Cecilie Kicks, NP  Kathyrn Drown, NP

## 2020-10-17 ENCOUNTER — Other Ambulatory Visit: Payer: Self-pay | Admitting: Cardiovascular Disease

## 2020-10-19 ENCOUNTER — Telehealth: Payer: Self-pay | Admitting: Cardiovascular Disease

## 2020-10-19 NOTE — Telephone Encounter (Signed)
Pt c/o Shortness Of Breath: STAT if SOB developed within the last 24 hours or pt is noticeably SOB on the phone  1. Are you currently SOB (can you hear that pt is SOB on the phone)? Patient wife calling  2. How long have you been experiencing SOB? About a week  3. Are you SOB when sitting or when up moving around? Both   4. Are you currently experiencing any other symptoms? Dizziness  light headed

## 2020-10-19 NOTE — Progress Notes (Signed)
Cardiology Office Note   Date:  10/20/2020   ID:  AZA LAUE, DOB 01-20-1946, MRN [id number]  PCP:  [name], MD    No chief complaint on file.  AI  Wt Readings from Last 3 Encounters:  10/20/20 202 lb (91.6 kg)  09/06/20 204 lb (92.5 kg)  07/27/20 196 lb (88.9 kg)       History of Present Illness: Adrian Franco is a 75 y.o. male  Seen urgently on DODO schedule for [facility].  Records show: "with history of HLD, HTN, CKD baseline Cr around 1.5. Diagnosed with submassive PE in August of 2017. Seen by Dr Donzetta Matters [facility] and ? If event ppt by testosterone supplements. 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 Patient stated it feels similar to when he had his PE. Patient stated he is not sick with congestion or fever. Patient is on xarelto 20 mg.  Patient has history of moderate AR with aortic aneurysm 4.2 cm, and was told to call our office if he starts having any SOB. Made patient an appointment with DOD tomorrow to be evaluated. Encouraged patient to go to ED if his symptoms get worse or if resting does not help his symptoms. Patient and his wife verbalized understanding."  In the past week, he has had  more SHOB with walking up stairs and bending down.  He has had a cough with some dizziness.    Past Medical History:  Diagnosis Date  . Anxiety   . Aortic aneurysm ([place])    followed by Dr Johnsie Cancel  . Atypical chest pain   . BPH (benign prostatic hypertrophy)   . Chronic back pain   . Depression   . DVT (deep venous thrombosis) ([place])   . GERD (gastroesophageal reflux disease)   . History of kidney stones   . HTN (hypertension)   . Hyperlipemia   . Hypogonadism in male   . Insomnia   . Localized edema   . PE (pulmonary embolism)   . Renal insufficiency   . Restless leg syndrome   . Sleep apnea    severe OSA-cannot tolerate CPAP  . SOB (shortness of breath)   . Testicular hypofunction     Past Surgical History:  Procedure Laterality Date  . DRUG INDUCED ENDOSCOPY N/A 12/09/2019   Procedure: DRUG INDUCED ENDOSCOPY;  Surgeon: Melida Quitter, MD;  Location: [place];  Service: ENT;  Laterality: N/A;  . IMPLANTATION OF HYPOGLOSSAL NERVE STIMULATOR Right 02/17/2020   Procedure: IMPLANTATION OF HYPOGLOSSAL NERVE STIMULATOR;  Surgeon: Melida Quitter, MD;  Location: [place];  Service: ENT;  Laterality: Right;  . INGUINAL  HERNIA REPAIR    . KIDNEY STONE SURGERY     stent  . ROTATOR CUFF REPAIR Right      Current Outpatient Medications  Medication Sig Dispense Refill  . acetaminophen (TYLENOL) 500 MG tablet Take 1,000 mg by mouth every 6 (six) hours as needed for mild pain or moderate pain.     . Ascorbic Acid (VITAMIN C PO) Take 1 tablet by mouth daily.    . B Complex Vitamins (VITAMIN B COMPLEX PO) Take 1 tablet by mouth daily.    . diclofenac Sodium (VOLTAREN) 1 % GEL Apply 1 application topically 4 (four) times daily as needed (pain).    Marland Kitchen docusate sodium (COLACE) 100 MG capsule Take 100 mg by mouth daily.    . fenofibrate (TRICOR) 145 MG tablet Take 145 mg by mouth daily.    Marland Kitchen gabapentin (NEURONTIN) 300 MG capsule Take 300 mg by mouth at bedtime as needed (pain).      . irbesartan (AVAPRO) 150 MG tablet Take 150 mg by mouth daily.    . methocarbamol (ROBAXIN-750) 750 MG tablet Take 1 tablet (750 mg total) by mouth 4 (four) times daily. 60 tablet 0  . Multiple Vitamins-Minerals (ONE-A-DAY MENS 50+ ADVANTAGE) TABS Take 1 tablet by mouth daily with breakfast.    . omeprazole (PRILOSEC) 40 MG capsule Take 40 mg by mouth every evening.     . rivaroxaban (XARELTO) 20 MG TABS tablet Take 1 tablet (20 mg total) by mouth daily with supper. 90 tablet 3  . rOPINIRole (REQUIP) 2 MG tablet Take 2 mg by mouth at bedtime.     . sertraline (ZOLOFT) 100 MG tablet Take 50 mg by mouth daily.     . simvastatin (ZOCOR) 40 MG tablet TAKE 1 TABLET EVERY DAY 90 tablet 3  . Specialty Vitamins Products (ADVANCED COLLAGEN PO) Take 1 tablet by mouth daily.    . tamsulosin (FLOMAX) 0.4 MG CAPS capsule Take 0.4 mg by mouth daily.     . Testosterone Cypionate 200 MG/ML SOLN Inject 200 mg into the muscle every 14 (fourteen) days.     Marland Kitchen tiZANidine (ZANAFLEX) 2 MG tablet Take 2 mg by mouth at bedtime as needed for muscle spasms.     Marland Kitchen VITAMIN D PO Take 2 tablets by mouth daily. Bid    . zolpidem (AMBIEN) 10 MG tablet Take 0.5-1 tablets (5-10 mg total) by mouth at bedtime as needed for sleep. 30 tablet 2   No current facility-administered medications for this visit.    Allergies:   Sulfa antibiotics, Amoxapine and related, and Lotensin [benazepril hcl]    Social History:  The patient  reports that he has never smoked. He has never used smokeless tobacco. He reports that he does not drink alcohol and does not use drugs.   Family History:  The patient's family history includes Healthy in his brother, sister, son, and son; Heart attack in his father; Hyperlipidemia in his father and mother; Other in his brother.    ROS:  Please see the history of present illness.   Otherwise, review of systems are positive for left leg pain and swelling.   All other systems are reviewed and negative.     PHYSICAL EXAM: VS:  BP 94/60   Pulse (!) 57   Ht '5\' 8"'$  (1.727 m)   Wt 202 lb (91.6 kg)   SpO2 96%   BMI 30.71 kg/m  , BMI Body mass index is 30.71 kg/m. GEN: Well nourished, well developed, in  no acute distress  HEENT: normal  Neck: no JVD, carotid bruits, or masses Cardiac: RRR; no murmurs, rubs, or gallops; left leg edema  Respiratory:  clear to auscultation bilaterally, normal work of breathing GI: soft, nontender, nondistended, + BS MS: no deformity or atrophy  Skin: warm and dry, no rash Neuro:  Strength and sensation are intact Psych: euthymic mood, full affect   EKG:   The ekg ordered today demonstrates SB, LAD   Recent Labs: 06/08/2020: BUN 16; Creatinine, Ser 1.42; Hemoglobin 13.9; Platelets 240; Potassium 4.3; Sodium 138   Lipid Panel No results found for: CHOL, TRIG, HDL, CHOLHDL, VLDL, LDLCALC, LDLDIRECT   Other studies Reviewed: Additional studies/ records that were reviewed today with results demonstrating: prior labs reviewed.   ASSESSMENT AND PLAN:  1. Shortness of breath: No obvious fluid overload.  Plan for CXR.  BNP.  2. Moderate AI: No CHF on exam.   3. HTN: BPs at home have been low as well.  Given lightheadedness, will decrease irbesartan to 75 mg daily. Monitor the blood pressure at home and if the readings remain low, we could keep the lower dose.  If the readings go up down then we can go back to the 150 mg. 4. Hyperlipidemia:  5. CKD: Cr 1.4.  Will check BNP and BMet given SHOB 6. Moderate CAD noted in 10/2019. 7. Continue Xarelto.  Prior PE.    Current medicines are reviewed at length with the patient today.  The patient concerns regarding his medicines were addressed.  The following changes have been made:  No change  Labs/ tests ordered today include:  No orders of the defined types were placed in this encounter.   Recommend 150 minutes/week of aerobic exercise Low fat, low carb, high fiber diet recommended  Disposition:   FU  in 1 year   Signed, Larae Grooms, MD  10/20/2020 11:17 AM    [facility] [address], [place], [place]  [address] Phone: [phone]; Fax: [phone]

## 2020-10-19 NOTE — Telephone Encounter (Signed)
Patient has been having sob, lightheadedness, and cough with activity that has been going on for about a week. Patient stated it feels similar to when he had his PE. Patient stated he is not sick with congestion or fever. Patient is on xarelto 20 mg.  Patient has history of moderate AR with aortic aneurysm 4.2 cm, and was told to call our office if he starts having any SOB. Made patient an appointment with DOD tomorrow to be evaluated. Encouraged patient to go to ED if his symptoms get worse or if resting does not help his symptoms. Patient and his wife verbalized understanding.

## 2020-10-20 ENCOUNTER — Ambulatory Visit (INDEPENDENT_AMBULATORY_CARE_PROVIDER_SITE_OTHER): Payer: Medicare Other | Admitting: Interventional Cardiology

## 2020-10-20 ENCOUNTER — Ambulatory Visit
Admission: RE | Admit: 2020-10-20 | Discharge: 2020-10-20 | Disposition: A | Discharge status: Home / Self Care | Payer: Medicare Other | Source: Ambulatory Visit | Attending: Interventional Cardiology | Admitting: Interventional Cardiology

## 2020-10-20 ENCOUNTER — Encounter: Payer: Self-pay | Admitting: Interventional Cardiology

## 2020-10-20 ENCOUNTER — Other Ambulatory Visit: Payer: Self-pay

## 2020-10-20 VITALS — BP 94/60 | HR 57 | Ht 68.0 in | Wt 202.0 lb

## 2020-10-20 DIAGNOSIS — R0602 Shortness of breath: Secondary | ICD-10-CM

## 2020-10-20 DIAGNOSIS — I712 Thoracic aortic aneurysm, without rupture, unspecified: Secondary | ICD-10-CM

## 2020-10-20 DIAGNOSIS — I351 Nonrheumatic aortic (valve) insufficiency: Secondary | ICD-10-CM | POA: Diagnosis not present

## 2020-10-20 DIAGNOSIS — E785 Hyperlipidemia, unspecified: Secondary | ICD-10-CM | POA: Diagnosis not present

## 2020-10-20 DIAGNOSIS — N184 Chronic kidney disease, stage 4 (severe): Secondary | ICD-10-CM

## 2020-10-20 DIAGNOSIS — I10 Essential (primary) hypertension: Secondary | ICD-10-CM

## 2020-10-20 MED ORDER — IRBESARTAN 75 MG PO TABS: 75.0000 mg | ORAL_TABLET | Freq: Every day | ORAL | 6 refills | Status: DC

## 2020-10-20 NOTE — Patient Instructions (Signed)
Medication Instructions:  Your physician has recommended you make the following change in your medication: Decrease Irbesartan to 75 mg by mouth daily  *If you need a refill on your cardiac medications before your next appointment, please call your pharmacy*   Lab Work: Lab work to be done today--BMP, BNP, CBC If you have labs (blood work) drawn today and your tests are completely normal, you will receive your results only by: Marland Kitchen MyChart Message (if you have MyChart) OR . A paper copy in the mail If you have any lab test that is abnormal or we need to change your treatment, we will call you to review the results.   Testing/Procedures: A chest x-ray takes a picture of the organs and structures inside the chest, including the heart, lungs, and blood vessels. This test can show several things, including, whether the heart is enlarges; whether fluid is building up in the lungs; and whether pacemaker / defibrillator leads are still in place.  Have done today at [place]: At [facility], you and your health needs are our priority.  As part of our continuing mission to provide you with exceptional heart care, we have created designated Provider Care Teams.  These Care Teams include your primary Cardiologist (physician) and Advanced Practice Providers (APPs -  Physician Assistants and Nurse Practitioners) who all work together to provide you with the care you need, when you need it.  We recommend signing up for the patient portal called "MyChart".  Sign up information is provided on this After Visit Summary.  MyChart is used to connect with patients for Virtual Visits (Telemedicine).  Patients are able to view lab/test results, encounter notes, upcoming appointments, etc.  Non-urgent messages can be sent to your provider as well.   To learn more about what you can do with MyChart, go to [email or web address].    Your next appointment:   Follow up as  planned The format for your next appointment:   In Person  Provider:   You may see Jenkins Rouge, MD  or one of the following Advanced Practice Providers on your designated Care Team:    Kathyrn Drown, NP    Other Instructions

## 2020-10-21 LAB — BASIC METABOLIC PANEL
BUN/Creatinine Ratio: 12 (ref 10–24)
BUN: 18 mg/dL (ref 8–27)
CO2: 22 mmol/L (ref 20–29)
Calcium: 9.6 mg/dL (ref 8.6–10.2)
Chloride: 99 mmol/L (ref 96–106)
Creatinine, Ser: 1.49 mg/dL — ABNORMAL HIGH (ref 0.76–1.27)
GFR calc Af Amer: 53 mL/min/{1.73_m2} — ABNORMAL LOW (ref >59)
GFR calc non Af Amer: 46 mL/min/{1.73_m2} — ABNORMAL LOW (ref >59)
Glucose: 84 mg/dL (ref 65–99)
Potassium: 4.5 mmol/L (ref 3.5–5.2)
Sodium: 139 mmol/L (ref 134–144)

## 2020-10-21 LAB — CBC
Hematocrit: 42.3 % (ref 37.5–51.0)
Hemoglobin: 14.2 g/dL (ref 13.0–17.7)
MCH: 29.6 pg (ref 26.6–33.0)
MCHC: 33.6 g/dL (ref 31.5–35.7)
MCV: 88 fL (ref 79–97)
Platelets: 260 10*3/uL (ref 150–450)
RBC: 4.8 x10E6/uL (ref 4.14–5.80)
RDW: 12.3 % (ref 11.6–15.4)
WBC: 6 10*3/uL (ref 3.4–10.8)

## 2020-10-21 LAB — PRO B NATRIURETIC PEPTIDE: NT-Pro BNP: 38 pg/mL (ref 0–376)

## 2020-12-02 ENCOUNTER — Other Ambulatory Visit: Payer: Self-pay | Admitting: Neurological Surgery

## 2020-12-02 DIAGNOSIS — M5416 Radiculopathy, lumbar region: Secondary | ICD-10-CM

## 2020-12-13 ENCOUNTER — Other Ambulatory Visit: Payer: Self-pay | Admitting: Orthopaedic Surgery

## 2020-12-13 DIAGNOSIS — M25512 Pain in left shoulder: Secondary | ICD-10-CM

## 2020-12-16 ENCOUNTER — Other Ambulatory Visit: Payer: Self-pay

## 2020-12-16 ENCOUNTER — Ambulatory Visit
Admission: RE | Admit: 2020-12-16 | Discharge: 2020-12-16 | Disposition: A | Discharge status: Home / Self Care | Payer: Medicare Other | Source: Ambulatory Visit | Attending: Neurological Surgery | Admitting: Neurological Surgery

## 2020-12-16 ENCOUNTER — Telehealth: Payer: Self-pay

## 2020-12-16 ENCOUNTER — Ambulatory Visit
Admission: RE | Admit: 2020-12-16 | Discharge: 2020-12-16 | Disposition: A | Discharge status: Home / Self Care | Payer: Medicare Other | Source: Ambulatory Visit | Attending: Orthopaedic Surgery | Admitting: Orthopaedic Surgery

## 2020-12-16 DIAGNOSIS — M5416 Radiculopathy, lumbar region: Secondary | ICD-10-CM

## 2020-12-16 DIAGNOSIS — M25512 Pain in left shoulder: Secondary | ICD-10-CM

## 2020-12-16 NOTE — Telephone Encounter (Signed)
   [facility] Pre-operative Risk Assessment    Patient Name: Adrian Franco  DOB: 24-Jul-1946  MRN: [id number]   HEARTCARE STAFF: - Please ensure there is not already an duplicate clearance open for this procedure. - Under Visit Info/Reason for Call, type in Other and utilize the format Clearance MM/DD/YY or Clearance TBD. Do not use dashes or single digits. - If request is for dental extraction, please clarify the # of teeth to be extracted.  Request for surgical clearance:  1. What type of surgery is being performed? Left total shoulder replacement   2. When is this surgery scheduled? TBD  3. What type of clearance is required (medical clearance vs. Pharmacy clearance to hold med vs. Both)? Both   4. Are there any medications that need to be held prior to surgery and how long? Xarelto   5. Practice name and name of physician performing surgery? Ralie[facility] Ip, Ophelia Charter, MD  6. What is the office phone number? [phone]   7.   What is the office fax number? [phone] Attn: Sherri  8.   Anesthesia type (None, local, MAC, general) ? Not specified    Mendel Ryder 12/16/2020, 4:53 PM  _________________________________________________________________   (provider comments below)

## 2020-12-19 NOTE — Telephone Encounter (Signed)
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 Patient feels worse off testosterone."  Procedure: left total shoulder replacement Date of procedure: TBD  Typically require 2-3 day Xarelto hold prior to shoulder replacement, however pt is at elevated risk off of anticoagulation given prior recurrent VTEs. We have not been managing pt's Xarelto (last prescribed by [place] 4 years ago). Recommend that clearance come from managing physician.

## 2020-12-20 ENCOUNTER — Other Ambulatory Visit: Payer: Medicare Other

## 2020-12-20 NOTE — Telephone Encounter (Signed)
   Name: Adrian Franco  DOB: 04-26-1946  MRN: [id number]   Primary Cardiologist: Jenkins Rouge, MD  Chart reviewed as part of pre-operative protocol coverage. Patient was contacted 12/20/2020 in reference to pre-operative risk assessment for pending surgery as outlined below.  Adrian Franco was last seen on 10/20/2020 by Dr. Irish Lack.  Since that day, Adrian Franco has done well without any exertional chest discomfort or worsening dyspnea.  He has been able to achieve more than 4 METS of activity without any issue [place].  Therefore, based on ACC/AHA guidelines, the patient would be at acceptable risk for the planned procedure without further cardiovascular testing.   His Xarelto is being managed by Dr. Macarthur Critchley, we will defer to prescribing MD to manage holding Xarelto during perioperative phase.  The patient was advised that if he develops new symptoms prior to surgery to contact our office to arrange for a follow-up visit, and he verbalized understanding.  I will route this recommendation to the requesting party via Epic fax function and remove from pre-op pool. Please call with questions.  [place], [place] 12/20/2020, 10:11 AM

## 2021-03-01 NOTE — Patient Instructions (Signed)
DUE TO COVID-19 ONLY ONE VISITOR IS ALLOWED TO COME WITH YOU AND STAY IN THE WAITING ROOM ONLY DURING PRE OP AND PROCEDURE DAY OF SURGERY. THE 1 VISITOR  MAY VISIT WITH YOU AFTER SURGERY IN YOUR PRIVATE ROOM DURING VISITING HOURS ONLY!               Adrian Franco   Your procedure is scheduled on: 03/08/21   Report to [facility] Main  Entrance   Report to admitting at : 11:45 AM     Call this number if you have problems the morning of surgery [phone]    Remember: NO SOLID FOOD AFTER MIDNIGHT THE NIGHT PRIOR TO SURGERY. NOTHING BY MOUTH EXCEPT CLEAR LIQUIDS UNTIL: 11:15 AM . PLEASE FINISH ENSURE DRINK PER SURGEON ORDER  WHICH NEEDS TO BE COMPLETED AT: 11:15 AM.  CLEAR LIQUID DIET  Foods Allowed                                                                     Foods Excluded  Coffee and tea, regular and decaf                             liquids that you cannot  Plain Jell-O any favor except red or purple                                           see through such as: Fruit ices (not with fruit pulp)                                     milk, soups, orange juice  Iced Popsicles                                    All solid food Carbonated beverages, regular and diet                                    Cranberry, grape and apple juices Sports drinks like Gatorade Lightly seasoned clear broth or consume(fat free) Sugar, honey syrup  Sample Menu Breakfast                                Lunch                                     Supper Cranberry juice                    Beef broth                            Chicken broth Jell-O  Grape juice                           Apple juice Coffee or tea                        Jell-O                                      Popsicle                                                Coffee or tea                        Coffee or tea  _____________________________________________________________________    BRUSH YOUR TEETH MORNING OF SURGERY AND RINSE YOUR MOUTH OUT, NO CHEWING GUM CANDY OR MINTS.   Take these medicines the morning of surgery with A SIP OF WATER: pregabalin,tamsulosin,omeprazole.                               You may not have any metal on your body including hair pins and              piercings  Do not wear jewelry,lotions, powders or perfumes, deodorant             Men may shave face and neck.   Do not bring valuables to the hospital. [place].  Contacts, dentures or bridgework may not be worn into surgery.  Leave suitcase in the car. After surgery it may be brought to your room.     Patients discharged the day of surgery will not be allowed to drive home. IF YOU ARE HAVING SURGERY AND GOING HOME THE SAME DAY, YOU MUST HAVE AN ADULT TO DRIVE YOU HOME AND BE WITH YOU FOR 24 HOURS. YOU MAY GO HOME BY TAXI OR UBER OR ORTHERWISE, BUT AN ADULT MUST ACCOMPANY YOU HOME AND STAY WITH YOU FOR 24 HOURS.  Name and phone number of your driver:  Special Instructions: N/A              Please read over the following fact sheets you were given: ____________________________________________________________________           [facility] - Preparing for Surgery Before surgery, you can play an important role.  Because skin is not sterile, your skin needs to be as free of germs as possible.  You can reduce the number of germs on your skin by washing with CHG (chlorahexidine gluconate) soap before surgery.  CHG is an antiseptic cleaner which kills germs and bonds with the skin to continue killing germs even after washing. Please DO NOT use if you have an allergy to CHG or antibacterial soaps.  If your skin becomes reddened/irritated stop using the CHG and inform your nurse when you arrive at Short Stay. Do not shave (including legs and underarms) for at least 48 hours prior to the first CHG shower.  You may shave your face/neck. Please follow these  instructions carefully:  1.  Shower with CHG Soap the night before surgery and the  morning of Surgery.  2.  If you choose to wash your hair, wash your hair first as usual with your  normal  shampoo.  3.  After you shampoo, rinse your hair and body thoroughly to remove the  shampoo.                           4.  Use CHG as you would any other liquid soap.  You can apply chg directly  to the skin and wash                       Gently with a scrungie or clean washcloth.  5.  Apply the CHG Soap to your body ONLY FROM THE NECK DOWN.   Do not use on face/ open                           Wound or open sores. Avoid contact with eyes, ears mouth and genitals (private parts).                       Wash face,  Genitals (private parts) with your normal soap.             6.  Wash thoroughly, paying special attention to the area where your surgery  will be performed.  7.  Thoroughly rinse your body with warm water from the neck down.  8.  DO NOT shower/wash with your normal soap after using and rinsing off  the CHG Soap.                9.  Pat yourself dry with a clean towel.            10.  Wear clean pajamas.            11.  Place clean sheets on your bed the night of your first shower and do not  sleep with pets. Day of Surgery : Do not apply any lotions/deodorants the morning of surgery.  Please wear clean clothes to the hospital/surgery center.  FAILURE TO FOLLOW THESE INSTRUCTIONS MAY RESULT IN THE CANCELLATION OF YOUR SURGERY PATIENT SIGNATURE_________________________________  NURSE SIGNATURE__________________________________  ________________________________________________________________________   [facility]- Preparing for Total Shoulder Arthroplasty    Before surgery, you can play an important role. Because skin is not sterile, your skin needs to be as free of germs as possible. You can reduce the number of germs on your skin by using the following products. Benzoyl Peroxide Gel Reduces the  number of germs present on the skin Applied twice a day to shoulder area starting two days before surgery    ==================================================================  Please follow these instructions carefully:  BENZOYL PEROXIDE 5% GEL  Please do not use if you have an allergy to benzoyl peroxide.   If your skin becomes reddened/irritated stop using the benzoyl peroxide.  Starting two days before surgery, apply as follows: Apply benzoyl peroxide in the morning and at night. Apply after taking a shower. If you are not taking a shower clean entire shoulder front, back, and side along with the armpit with a clean wet washcloth.  Place a quarter-sized dollop on your shoulder and rub in thoroughly, making sure to cover the front, back, and side of your shoulder, along with the armpit.   2 days before ____ AM  ____ PM              1 day before ____ AM   ____ PM                         Do this twice a day for two days.  (Last application is the night before surgery, AFTER using the CHG soap as described below).  Do NOT apply benzoyl peroxide gel on the day of surgery.    Incentive Spirometer  An incentive spirometer is a tool that can help keep your lungs clear and active. This tool measures how well you are filling your lungs with each breath. Taking long deep breaths may help reverse or decrease the chance of developing breathing (pulmonary) problems (especially infection) following: A long period of time when you are unable to move or be active. BEFORE THE PROCEDURE  If the spirometer includes an indicator to show your best effort, your nurse or respiratory therapist will set it to a desired goal. If possible, sit up straight or lean slightly forward. Try not to slouch. Hold the incentive spirometer in an upright position. INSTRUCTIONS FOR USE  Sit on the edge of your bed if possible, or sit up as far as you can in bed or on a chair. Hold the incentive spirometer in an upright  position. Breathe out normally. Place the mouthpiece in your mouth and seal your lips tightly around it. Breathe in slowly and as deeply as possible, raising the piston or the ball toward the top of the column. Hold your breath for 3-5 seconds or for as long as possible. Allow the piston or ball to fall to the bottom of the column. Remove the mouthpiece from your mouth and breathe out normally. Rest for a few seconds and repeat Steps 1 through 7 at least 10 times every 1-2 hours when you are awake. Take your time and take a few normal breaths between deep breaths. The spirometer may include an indicator to show your best effort. Use the indicator as a goal to work toward during each repetition. After each set of 10 deep breaths, practice coughing to be sure your lungs are clear. If you have an incision (the cut made at the time of surgery), support your incision when coughing by placing a pillow or rolled up towels firmly against it. Once you are able to get out of bed, walk around indoors and cough well. You may stop using the incentive spirometer when instructed by your caregiver.  RISKS AND COMPLICATIONS Take your time so you do not get dizzy or light-headed. If you are in pain, you may need to take or ask for pain medication before doing incentive spirometry. It is harder to take a deep breath if you are having pain. AFTER USE Rest and breathe slowly and easily. It can be helpful to keep track of a log of your progress. Your caregiver can provide you with a simple table to help with this. If you are using the spirometer at home, follow these instructions: [place] IF:  You are having difficultly using the spirometer. You have trouble using the spirometer as often as instructed. Your pain medication is not giving enough relief while using the spirometer. You develop fever of 100.5 F (38.1 C) or higher. SEEK IMMEDIATE MEDICAL CARE IF:  You cough up bloody sputum that had not been  present before. You develop fever of 102 F (38.9 C) or greater. You  develop worsening pain at or near the incision site. MAKE SURE YOU:  Understand these instructions. Will watch your condition. Will get help right away if you are not doing well or get worse. Document Released: 12/24/2006 Document Revised: 11/05/2011 Document Reviewed: 02/24/2007 [facility] Patient Information 2014 [place], [place].   ________________________________________________________________________

## 2021-03-02 ENCOUNTER — Encounter (HOSPITAL_COMMUNITY): Payer: Self-pay

## 2021-03-02 ENCOUNTER — Encounter (HOSPITAL_COMMUNITY)
Admission: RE | Admit: 2021-03-02 | Discharge: 2021-03-02 | Disposition: A | Discharge status: Home / Self Care | Payer: Medicare Other | Source: Ambulatory Visit | Attending: Orthopaedic Surgery | Admitting: Orthopaedic Surgery

## 2021-03-02 ENCOUNTER — Other Ambulatory Visit: Payer: Self-pay

## 2021-03-02 DIAGNOSIS — Z01812 Encounter for preprocedural laboratory examination: Secondary | ICD-10-CM | POA: Diagnosis present

## 2021-03-02 HISTORY — DX: Aneurysm of unspecified site: I72.9

## 2021-03-02 HISTORY — DX: Unspecified osteoarthritis, unspecified site: M19.90

## 2021-03-02 LAB — BASIC METABOLIC PANEL
Anion gap: 9 (ref 5–15)
BUN: 20 mg/dL (ref 8–23)
CO2: 27 mmol/L (ref 22–32)
Calcium: 9.8 mg/dL (ref 8.9–10.3)
Chloride: 100 mmol/L (ref 98–111)
Creatinine, Ser: 1.08 mg/dL (ref 0.61–1.24)
GFR, Estimated: >60 mL/min (ref >60)
Glucose, Bld: 100 mg/dL — ABNORMAL HIGH (ref 70–99)
Potassium: 4.5 mmol/L (ref 3.5–5.1)
Sodium: 136 mmol/L (ref 135–145)

## 2021-03-02 LAB — CBC
HCT: 45.9 % (ref 39.0–52.0)
Hemoglobin: 14.8 g/dL (ref 13.0–17.0)
MCH: 29.3 pg (ref 26.0–34.0)
MCHC: 32.2 g/dL (ref 30.0–36.0)
MCV: 90.9 fL (ref 80.0–100.0)
Platelets: 205 10*3/uL (ref 150–400)
RBC: 5.05 MIL/uL (ref 4.22–5.81)
RDW: 13.5 % (ref 11.5–15.5)
WBC: 6.3 10*3/uL (ref 4.0–10.5)
nRBC: 0 % (ref 0.0–0.2)

## 2021-03-02 LAB — SURGICAL PCR SCREEN
MRSA, PCR: NEGATIVE
Staphylococcus aureus: NEGATIVE

## 2021-03-02 NOTE — Progress Notes (Addendum)
COVID Vaccine Completed: Yes Date COVID Vaccine completed: 05/2020 COVID vaccine manufacturer:    Moderna     PCP - Dr. [name] Cardiologist - Dr. Jenkins Rouge. Clearance:Almyra Deforest PA: 12/20/20: EPIC  Chest x-ray - 10/21/20 EKG - 09/30/20 Stress Test -  ECHO - 09/06/20 Cardiac Cath -  Pacemaker/ICD device last checked:  Sleep Study - Yes> PT has hypoglossal implant. Levada Dy PA was notified about it.. CPAP -   Fasting Blood Sugar -  Checks Blood Sugar _____ times a day  Blood Thinner Instructions: Xarelto will be held 5 days before surgery as per Dr. Macarthur Critchley instructions. Aspirin Instructions: Last Dose:  Anesthesia review: Hx: HTN,Aorta aneurism,DVT,PE,OSA(No CPAP)  Patient denies shortness of breath, fever, cough and chest pain at PAT appointment   Patient verbalized understanding of instructions that were given to them at the PAT appointment. Patient was also instructed that they will need to review over the PAT instructions again at home before surgery.

## 2021-03-03 NOTE — Anesthesia Preprocedure Evaluation (Addendum)
Anesthesia Evaluation  Patient identified by MRN, date of birth, ID band Patient awake    Reviewed: Allergy & Precautions, NPO status , Patient's Chart, lab work & pertinent test results  Airway Mallampati: II  TM Distance: >3 FB Neck ROM: Full    Dental no notable dental hx. (+) Teeth Intact, Dental Advisory Given   Pulmonary neg pulmonary ROS, sleep apnea ,    Pulmonary exam normal breath sounds clear to auscultation       Cardiovascular hypertension, Pt. on medications + CAD, + Peripheral Vascular Disease and + DVT (hx of)  Normal cardiovascular exam Rhythm:Regular Rate:Normal     Neuro/Psych    GI/Hepatic Neg liver ROS, GERD  Medicated,  Endo/Other    Renal/GU Renal InsufficiencyRenal diseaseLab Results      Component                Value               Date                      CREATININE               1.08                03/02/2021                BUN                      20                  03/02/2021                NA                       136                 03/02/2021                K                        4.5                 03/02/2021                CL                       100                 03/02/2021                CO2                      27                  03/02/2021                Musculoskeletal  (+) Arthritis ,   Abdominal (+) + obese,   Peds  Hematology Lab Results      Component                Value               Date                      WBC  6.3                 03/02/2021                HGB                      14.8                03/02/2021                HCT                      45.9                03/02/2021                MCV                      90.9                03/02/2021                PLT                      205                 03/02/2021              Anesthesia Other Findings   Reproductive/Obstetrics                            Anesthesia Physical Anesthesia Plan  ASA: 3  Anesthesia Plan: General   Post-op Pain Management:  Regional for Post-op pain   Induction: Intravenous  PONV Risk Score and Plan: 3 and Treatment may vary due to age or medical condition, Ondansetron, Dexamethasone and Midazolam  Airway Management Planned: Oral ETT  Additional Equipment: None  Intra-op Plan:   Post-operative Plan: Extubation in OR  Informed Consent: I have reviewed the patients History and Physical, chart, labs and discussed the procedure including the risks, benefits and alternatives for the proposed anesthesia with the patient or authorized representative who has indicated his/her understanding and acceptance.     Dental advisory given  Plan Discussed with: CRNA  Anesthesia Plan Comments: (See APP note by Durel Salts, FNP   GA w L ISB)      Anesthesia Quick Evaluation

## 2021-03-03 NOTE — Progress Notes (Signed)
Anesthesia Chart Review:   Case: [id number] Date/Time: 03/08/21 1400   Procedure: REVERSE SHOULDER ARTHROPLASTY (Left: Shoulder)   Anesthesia type: Choice   Pre-op diagnosis: djd left shoulder   Location: [facility] ROOM 06 / [facility]   Surgeons: Hiram Gash, MD       DISCUSSION: Pt is 75 years old with hx of HTN, PE, DVT, OSA (has hypoglossal stimulator), ascending aortic aneurysm (71m by 06/2020 CT)  Pt will have hypoglossal stimulator off on arrival day of surgery. His wife will have controller for it with her if it is needed.   PCP instructed pt to hold xarelto 5 days before surgery   VS: BP 131/63   Pulse (!) 53   Temp (!) 36.4 C   Ht '5\' 8"'$  (1.727 m)   Wt 90.7 kg   SpO2 98%   BMI 30.41 kg/m   PROVIDERS: - PCP is JEarney Mallet MD - Cardiologist is PJenkins Rouge MD. Last office visit 10/20/20 with JLarae Grooms MD. Cleared for surgery at acceptable risk by HAlmyra Deforest PA on 12/20/20   LABS: Labs reviewed: Acceptable for surgery. (all labs ordered are listed, but only abnormal results are displayed)  Labs Reviewed  BASIC METABOLIC PANEL - Abnormal; Notable for the following components:      Result Value   Glucose, Bld 100 (*)    All other components within normal limits  SURGICAL PCR SCREEN  CBC     IMAGES: CXR 10/21/20: No active cardiopulmonary disease  CT chest 07/02/20:  1. Stable uncomplicated fusiform aneurysmal dilatation of the ascending thoracic aorta measuring 42 mm in diameter, grossly unchanged compared to the 2017 examination. Recommend annual imaging followup by CTA or MRA.  2. Cardiomegaly with calcifications within the aortic leaflets as could be seen in the setting of aortic stenosis. 3. Coronary artery calcifications. Aortic Atherosclerosis  EKG 10/20/20: sinus bradycardia, LAD   CV: Echo 09/06/20:  1. 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 Suspect it is mild to moderate. Normal LV dimensions and normal LV function also support non-significant  AI. The aortic valve is tricuspid. Aortic valve regurgitation is mild to moderate. No aortic stenosis is present.   2. Left ventricular ejection fraction, by estimation, is 60 to 65%. The left ventricle has normal function. The left ventricle has no regional wall motion abnormalities. There is mild concentric left ventricular hypertrophy. Left ventricular diastolic parameters are consistent with Grade II diastolic dysfunction (pseudonormalization).   3. Right ventricular systolic function is normal. The right ventricular size is normal. Tricuspid regurgitation signal is inadequate for assessing PA pressure.   4. Left atrial size was mildly dilated.   5. The mitral valve is grossly normal. No evidence of mitral valve regurgitation. No evidence of mitral stenosis.   6. Aneurysm of the ascending aorta, measuring 45 mm.   7. The inferior vena cava is normal in size with greater than 50% respiratory variability, suggesting right atrial pressure of 3 mmHg.  - Comparison(s): No significant change from prior study. Ascending aorta measurements stable. AI remains mild to moderate.  Cardiac CTA 11/16/19:  - moderate RCA/LAD disease negative FFR CT - Aortic root 4.2 cm  Nuclear stress test 04/05/16:  Nuclear stress EF: 55%. Blood pressure demonstrated a hypertensive response to exercise. There was no ST segment deviation noted during stress. No T wave inversion was noted during stress. The study is normal. This is a low risk study. The left ventricular ejection fraction is normal (55-65%).    Past Medical History:  Diagnosis Date   Aneurysm ([place])    Anxiety    Aortic aneurysm ([facility])    followed by Dr Johnsie Cancel   Arthritis    Atypical chest pain    BPH (benign prostatic hypertrophy)    Chronic back pain    Depression    DVT (deep venous thrombosis) ([facility])    GERD (gastroesophageal reflux disease)    History of kidney stones    HTN (hypertension)    Hyperlipemia    Hypogonadism in male     Insomnia    Localized edema    PE (pulmonary embolism)    Renal insufficiency    Restless leg syndrome    Sleep apnea    severe OSA-cannot tolerate CPAP   SOB (shortness of breath)    Testicular hypofunction     Past Surgical History:  Procedure Laterality Date   DRUG INDUCED ENDOSCOPY N/A 12/09/2019   Procedure: DRUG INDUCED ENDOSCOPY;  Surgeon: Melida Quitter, MD;  Location: [place];  Service: ENT;  Laterality: N/A;   IMPLANTATION OF HYPOGLOSSAL NERVE STIMULATOR Right 02/17/2020   Procedure: IMPLANTATION OF HYPOGLOSSAL NERVE STIMULATOR;  Surgeon: Melida Quitter, MD;  Location: [place];  Service: ENT;  Laterality: Right;   INGUINAL HERNIA REPAIR     KIDNEY STONE SURGERY     stent   ROTATOR CUFF REPAIR Right     MEDICATIONS:  acetaminophen (TYLENOL) 500 MG tablet   Ascorbic Acid (VITAMIN C) 1000 MG tablet   Cholecalciferol (VITAMIN D) 50 MCG (2000 UT) tablet   Collagen-Vitamin C (SUPER COLLAGEN PLUS VITAMIN C PO)   Cyanocobalamin (B-12) 5000 MCG CAPS   docusate sodium (COLACE) 100 MG capsule   irbesartan (AVAPRO) 75 MG tablet   LORazepam (ATIVAN) 0.5 MG tablet   Multiple Vitamins-Minerals (ONE-A-DAY MENS 50+ ADVANTAGE) TABS   omeprazole (PRILOSEC) 40 MG capsule   oxymetazoline (AFRIN) 0.05 % nasal spray   Polyvinyl Alcohol-Povidone (REFRESH OP)   pregabalin (LYRICA) 50 MG capsule   rivaroxaban (XARELTO) 20 MG TABS tablet   rOPINIRole (REQUIP) 4 MG tablet   simvastatin (ZOCOR) 40 MG tablet   tamsulosin (FLOMAX) 0.4 MG CAPS capsule   Testosterone Cypionate 200 MG/ML SOLN   tiZANidine (ZANAFLEX) 2 MG tablet   No current facility-administered medications for this encounter.    If no changes, I anticipate pt can proceed with surgery as scheduled.   Willeen Cass, PhD, FNP-BC [facility] Short Stay Surgical Center/Anesthesiology Phone: [phone] 03/03/2021 3:00 PM

## 2021-03-07 NOTE — H&P (Signed)
PREOPERATIVE H&P  Chief Complaint: djd left shoulder  HPI: Adrian Franco is a 75 y.o. male who is scheduled for Procedure(s): REVERSE SHOULDER ARTHROPLASTY.   Patient has a past medical history significant for aortic aneursm, BPH, DVT, GERD, HTN, HLD, PE, RLS, Sleep apnea,.   Patient is a 75 year-old who has a history of left shoulder pain.  He has had pain for many years.  There is an extensive medical history.  He had a massive PE in 2017.  He has CKD and obstructive sleep apnea, not on a CPAP.  He is on Xarelto for his PE.  He has aortic aneurysm.  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  He is limited by his shoulder.  He is accompanied by his wife.  He had a right cuff repair and that side seems to be giving him trouble too, but his left side is his primary issue.  It bothers him with 10/10 pain occasionally.    His symptoms are rated as moderate to severe, and have been worsening.  This is significantly impairing activities of daily living.    Please see clinic note for further details on this patient's care.    He has elected for surgical management.   Past Medical History:  Diagnosis Date   Aneurysm ([place])    Anxiety    Aortic aneurysm ([facility])    followed by Dr Johnsie Cancel   Arthritis    Atypical chest pain    BPH (benign prostatic hypertrophy)    Chronic back pain    Depression    DVT (deep venous thrombosis) ([facility])    GERD (gastroesophageal reflux disease)    History of kidney stones    HTN (hypertension)    Hyperlipemia    Hypogonadism in male    Insomnia    Localized edema    PE (pulmonary embolism)    Renal insufficiency    Restless leg syndrome    Sleep apnea    severe OSA-cannot tolerate CPAP   SOB (shortness of breath)    Testicular hypofunction    Past Surgical History:  Procedure Laterality Date   DRUG INDUCED ENDOSCOPY N/A 12/09/2019   Procedure: DRUG INDUCED ENDOSCOPY;   Surgeon: Melida Quitter, MD;  Location: [facility];  Service: ENT;  Laterality: N/A;   IMPLANTATION OF HYPOGLOSSAL NERVE STIMULATOR Right 02/17/2020   Procedure: IMPLANTATION OF HYPOGLOSSAL NERVE STIMULATOR;  Surgeon: Melida Quitter, MD;  Location: [facility] OR;  Service: ENT;  Laterality: Right;   INGUINAL HERNIA REPAIR     KIDNEY STONE SURGERY     stent   ROTATOR CUFF REPAIR Right    Social History   Socioeconomic History   Marital status: Married    Spouse name: Not on file   Number of children: Not on file   Years of education: Not on file   Highest education level: Not on file  Occupational History   Not on file  Tobacco Use   Smoking status: Never   Smokeless tobacco: Never  Vaping Use   Vaping Use: Never used  Substance and Sexual Activity   Alcohol use: No    Alcohol/week: 0.0 standard drinks   Drug use: No   Sexual activity: Yes    Comment: married  Other Topics Concern   Not on file  Social History Narrative   Not on file   Social Determinants of Health  Financial Resource Strain: Not on file  Food Insecurity: Not on file  Transportation Needs: Not on file  Physical Activity: Not on file  Stress: Not on file  Social Connections: Not on file   Family History  Problem Relation Age of Onset   Hyperlipidemia Mother    Hyperlipidemia Father    Heart attack Father    Healthy Son    Healthy Brother    Other Brother    Healthy Sister    Healthy Son    Allergies  Allergen Reactions   Sulfa Antibiotics Itching   Amoxapine And Related Hives and Rash   Lotensin [Benazepril Hcl] Hives and Rash   Prior to Admission medications   Medication Sig Start Date End Date Taking? Authorizing Provider  acetaminophen (TYLENOL) 500 MG tablet Take 1,000 mg by mouth every 6 (six) hours as needed for mild pain or moderate pain.    Yes [provider]  Ascorbic Acid (VITAMIN C) 1000 MG tablet Take 1,000 mg by mouth in the morning.   Yes [provider]  Cholecalciferol (VITAMIN D) 50 MCG (2000 UT) tablet Take 2,000 Units by mouth daily.   Yes [provider]  Collagen-Vitamin C (SUPER COLLAGEN PLUS VITAMIN C PO) Take 1 tablet by mouth daily.   Yes [provider]  Cyanocobalamin (B-12) 5000 MCG CAPS Take 5,000 mcg by mouth daily.   Yes [provider]  docusate sodium (COLACE) 100 MG capsule Take 200 mg by mouth daily.   Yes [provider]  irbesartan (AVAPRO) 75 MG tablet Take 1 tablet (75 mg total) by mouth daily. 10/20/20  Yes Jettie Booze, MD  LORazepam (ATIVAN) 0.5 MG tablet Take 0.5 mg by mouth at bedtime. 02/03/21  Yes [provider]  Multiple Vitamins-Minerals (ONE-A-DAY MENS 50+ ADVANTAGE) TABS Take 1 tablet by mouth daily with breakfast.   Yes [provider]  omeprazole (PRILOSEC) 40 MG capsule Take 40 mg by mouth daily.   Yes [provider]  oxymetazoline (AFRIN) 0.05 % nasal spray Place 1 spray into both nostrils at bedtime.   Yes [provider]  Polyvinyl Alcohol-Povidone (REFRESH OP) Place 1 drop into the left eye daily.   Yes [provider]  pregabalin (LYRICA) 50 MG capsule Take 50 mg by mouth 2 (two) times daily.   Yes [provider]  rivaroxaban (XARELTO) 20 MG TABS tablet Take 1 tablet (20 mg total) by mouth daily with supper. Patient taking differently: Take 20 mg by mouth at bedtime. 10/19/16  Yes Josue Hector, MD  rOPINIRole (REQUIP) 4 MG tablet Take 4 mg by mouth at bedtime.   Yes [provider]  simvastatin (ZOCOR) 40 MG tablet TAKE 1 TABLET EVERY DAY Patient taking differently: Take 40 mg by mouth daily. 10/17/20  Yes Josue Hector, MD  tamsulosin (FLOMAX) 0.4 MG CAPS capsule Take 0.4 mg by mouth 2 (two) times daily.   Yes [provider]  Testosterone Cypionate 200 MG/ML SOLN Inject 200 mg into the muscle every 14 (fourteen) days.    Yes [provider]  tiZANidine (ZANAFLEX) 2 MG  tablet Take 2 mg by mouth every 8 (eight) hours as needed for muscle spasms. 09/22/19  Yes [provider]    ROS: All other systems have been reviewed and were otherwise negative with the exception of those mentioned in the HPI and as above.  Physical Exam: General: Alert, no acute distress Cardiovascular: No pedal edema Respiratory: No cyanosis, no use  of accessory musculature GI: No organomegaly, abdomen is soft and non-tender Skin: No lesions in the area of chief complaint Neurologic: Sensation intact distally Psychiatric: Patient is competent for consent with normal mood and affect Lymphatic: No axillary or cervical lymphadenopathy  MUSCULOSKELETAL:  Left shoulder: Active forward elevation on the left to 100 degrees, passive to same.  External rotation to 20 versus 60.  Cuff strength is 5/5.    Imaging: X-rays reviewed demonstrate a B2 glenoid and significant preservation of the humeral head.  He has flattening of the humeral head and glenohumeral arthritis.    Assessment: djd left shoulder  Plan: Plan for Procedure(s): REVERSE SHOULDER ARTHROPLASTY  The risks benefits and alternatives were discussed with the patient including but not limited to the risks of nonoperative treatment, versus surgical intervention including infection, bleeding, nerve injury,  blood clots, cardiopulmonary complications, morbidity, mortality, among others, and they were willing to proceed.   We additionally specifically discussed risks of axillary nerve injury, infection, periprosthetic fracture, continued pain and longevity of implants prior to beginning procedure.    Patient will be closely monitored in PACU for medical stabilization and pain control. If found stable in PACU, patient may be discharged home with outpatient follow-up. If any concerns regarding patient's stabilization patient will be admitted for observation after surgery. The patient is planning to be discharged home with  outpatient PT.   The patient acknowledged the explanation, agreed to proceed with the plan and consent was signed.   He received clearance from his PCP, Dr. Ihor Gully, and cardiologist, Dr.Nishan.  Operative Plan: Left reverse total shoulder arthroplasty - full wedge Discharge Medications: Tylenol, Gabapentin, Oxycodone, Zofran, Robaxin DVT Prophylaxis: Resume Xarelto Physical Therapy: Outpatient PT Special Discharge needs: Sling. [place], PA-C  03/07/2021 7:20 AM

## 2021-03-08 ENCOUNTER — Ambulatory Visit (HOSPITAL_COMMUNITY): Payer: Medicare Other | Admitting: Emergency Medicine

## 2021-03-08 ENCOUNTER — Ambulatory Visit (HOSPITAL_COMMUNITY): Payer: Medicare Other | Admitting: Anesthesiology

## 2021-03-08 ENCOUNTER — Ambulatory Visit (HOSPITAL_COMMUNITY)
Admission: RE | Admit: 2021-03-08 | Discharge: 2021-03-08 | Disposition: A | Discharge status: Home / Self Care | Payer: Medicare Other | Attending: Orthopaedic Surgery | Admitting: Orthopaedic Surgery

## 2021-03-08 ENCOUNTER — Encounter (HOSPITAL_COMMUNITY): Payer: Self-pay | Admitting: Orthopaedic Surgery

## 2021-03-08 ENCOUNTER — Encounter (HOSPITAL_COMMUNITY)
Admission: RE | Disposition: A | Discharge status: Home / Self Care | Payer: Self-pay | Source: Home / Self Care | Attending: Orthopaedic Surgery

## 2021-03-08 ENCOUNTER — Ambulatory Visit (HOSPITAL_COMMUNITY): Payer: Medicare Other

## 2021-03-08 DIAGNOSIS — Z79899 Other long term (current) drug therapy: Secondary | ICD-10-CM | POA: Diagnosis not present

## 2021-03-08 DIAGNOSIS — Z86711 Personal history of pulmonary embolism: Secondary | ICD-10-CM | POA: Insufficient documentation

## 2021-03-08 DIAGNOSIS — K219 Gastro-esophageal reflux disease without esophagitis: Secondary | ICD-10-CM | POA: Diagnosis not present

## 2021-03-08 DIAGNOSIS — G4733 Obstructive sleep apnea (adult) (pediatric): Secondary | ICD-10-CM | POA: Insufficient documentation

## 2021-03-08 DIAGNOSIS — I129 Hypertensive chronic kidney disease with stage 1 through stage 4 chronic kidney disease, or unspecified chronic kidney disease: Secondary | ICD-10-CM | POA: Insufficient documentation

## 2021-03-08 DIAGNOSIS — E785 Hyperlipidemia, unspecified: Secondary | ICD-10-CM | POA: Insufficient documentation

## 2021-03-08 DIAGNOSIS — M19012 Primary osteoarthritis, left shoulder: Secondary | ICD-10-CM | POA: Diagnosis present

## 2021-03-08 DIAGNOSIS — Z09 Encounter for follow-up examination after completed treatment for conditions other than malignant neoplasm: Secondary | ICD-10-CM

## 2021-03-08 DIAGNOSIS — N189 Chronic kidney disease, unspecified: Secondary | ICD-10-CM | POA: Insufficient documentation

## 2021-03-08 DIAGNOSIS — Z888 Allergy status to other drugs, medicaments and biological substances status: Secondary | ICD-10-CM | POA: Insufficient documentation

## 2021-03-08 DIAGNOSIS — Z7901 Long term (current) use of anticoagulants: Secondary | ICD-10-CM | POA: Insufficient documentation

## 2021-03-08 DIAGNOSIS — Z86718 Personal history of other venous thrombosis and embolism: Secondary | ICD-10-CM | POA: Diagnosis not present

## 2021-03-08 DIAGNOSIS — Z882 Allergy status to sulfonamides status: Secondary | ICD-10-CM | POA: Diagnosis not present

## 2021-03-08 DIAGNOSIS — N400 Enlarged prostate without lower urinary tract symptoms: Secondary | ICD-10-CM | POA: Diagnosis not present

## 2021-03-08 DIAGNOSIS — G2581 Restless legs syndrome: Secondary | ICD-10-CM | POA: Diagnosis not present

## 2021-03-08 HISTORY — PX: REVERSE SHOULDER ARTHROPLASTY: SHX5054

## 2021-03-08 SURGERY — ARTHROPLASTY, SHOULDER, TOTAL, REVERSE
Anesthesia: General | Site: Shoulder | Laterality: Left

## 2021-03-08 MED ORDER — CHLORHEXIDINE GLUCONATE 0.12 % MT SOLN
15.0000 mL | Freq: Once | OROMUCOSAL | Status: AC
  Administered 2021-03-08: 15 mL via OROMUCOSAL

## 2021-03-08 MED ORDER — ONDANSETRON HCL 4 MG/2ML IJ SOLN
INTRAMUSCULAR | Status: AC
  Filled 2021-03-08: qty 2

## 2021-03-08 MED ORDER — VANCOMYCIN HCL 1 G IV SOLR
INTRAVENOUS | Status: DC | PRN
  Administered 2021-03-08: 1000 mg via TOPICAL

## 2021-03-08 MED ORDER — ONDANSETRON HCL 4 MG/2ML IJ SOLN: 4.0000 mg | Freq: Once | INTRAMUSCULAR | Status: DC | PRN

## 2021-03-08 MED ORDER — PROPOFOL 10 MG/ML IV BOLUS
INTRAVENOUS | Status: DC | PRN
  Administered 2021-03-08: 150 mg via INTRAVENOUS

## 2021-03-08 MED ORDER — SODIUM CHLORIDE 0.9 % IV SOLN
2.0000 g | INTRAVENOUS | Status: AC
  Administered 2021-03-08: 2 g via INTRAVENOUS
  Filled 2021-03-08: qty 2

## 2021-03-08 MED ORDER — ONDANSETRON HCL 4 MG/2ML IJ SOLN
INTRAMUSCULAR | Status: DC | PRN
  Administered 2021-03-08: 4 mg via INTRAVENOUS

## 2021-03-08 MED ORDER — ONDANSETRON HCL 4 MG PO TABS: 4.0000 mg | ORAL_TABLET | Freq: Three times a day (TID) | ORAL | 0 refills | Status: AC | PRN

## 2021-03-08 MED ORDER — STERILE WATER FOR IRRIGATION IR SOLN
Status: DC | PRN
  Administered 2021-03-08: 1000 mL

## 2021-03-08 MED ORDER — LIDOCAINE 2% (20 MG/ML) 5 ML SYRINGE
INTRAMUSCULAR | Status: DC | PRN
  Administered 2021-03-08: 100 mg via INTRAVENOUS

## 2021-03-08 MED ORDER — LIDOCAINE 2% (20 MG/ML) 5 ML SYRINGE
INTRAMUSCULAR | Status: AC
  Filled 2021-03-08: qty 5

## 2021-03-08 MED ORDER — TRANEXAMIC ACID-NACL 1000-0.7 MG/100ML-% IV SOLN
1000.0000 mg | INTRAVENOUS | Status: AC
  Administered 2021-03-08: 1000 mg via INTRAVENOUS
  Filled 2021-03-08: qty 100

## 2021-03-08 MED ORDER — PHENYLEPHRINE 40 MCG/ML (10ML) SYRINGE FOR IV PUSH (FOR BLOOD PRESSURE SUPPORT)
PREFILLED_SYRINGE | INTRAVENOUS | Status: DC | PRN
  Administered 2021-03-08: 120 ug via INTRAVENOUS

## 2021-03-08 MED ORDER — DEXAMETHASONE SODIUM PHOSPHATE 10 MG/ML IJ SOLN
INTRAMUSCULAR | Status: AC
  Filled 2021-03-08: qty 1

## 2021-03-08 MED ORDER — ACETAMINOPHEN 10 MG/ML IV SOLN
1000.0000 mg | Freq: Once | INTRAVENOUS | Status: DC | PRN
Start: 2021-03-08 — End: 2021-03-08

## 2021-03-08 MED ORDER — ROCURONIUM BROMIDE 10 MG/ML (PF) SYRINGE
PREFILLED_SYRINGE | INTRAVENOUS | Status: AC
  Filled 2021-03-08: qty 10

## 2021-03-08 MED ORDER — FENTANYL CITRATE (PF) 100 MCG/2ML IJ SOLN
50.0000 ug | INTRAMUSCULAR | Status: AC
  Administered 2021-03-08: 50 ug via INTRAVENOUS
  Filled 2021-03-08: qty 2

## 2021-03-08 MED ORDER — VANCOMYCIN HCL 1000 MG IV SOLR
INTRAVENOUS | Status: AC
  Filled 2021-03-08: qty 1000

## 2021-03-08 MED ORDER — DEXAMETHASONE SODIUM PHOSPHATE 4 MG/ML IJ SOLN
INTRAMUSCULAR | Status: DC | PRN
  Administered 2021-03-08: 8 mg via INTRAVENOUS

## 2021-03-08 MED ORDER — OXYCODONE HCL 5 MG PO TABS: ORAL_TABLET | ORAL | 0 refills | Status: AC

## 2021-03-08 MED ORDER — SODIUM CHLORIDE 0.9 % IR SOLN
Status: DC | PRN
  Administered 2021-03-08: 1000 mL

## 2021-03-08 MED ORDER — ORAL CARE MOUTH RINSE: 15.0000 mL | Freq: Once | OROMUCOSAL | Status: AC

## 2021-03-08 MED ORDER — LACTATED RINGERS IV SOLN: INTRAVENOUS | Status: DC

## 2021-03-08 MED ORDER — 0.9 % SODIUM CHLORIDE (POUR BTL) OPTIME
TOPICAL | Status: DC | PRN
  Administered 2021-03-08: 1000 mL

## 2021-03-08 MED ORDER — ACETAMINOPHEN 500 MG PO TABS: 1000.0000 mg | ORAL_TABLET | Freq: Three times a day (TID) | ORAL | 0 refills | Status: AC

## 2021-03-08 MED ORDER — METHOCARBAMOL 500 MG PO TABS: 500.0000 mg | ORAL_TABLET | Freq: Three times a day (TID) | ORAL | 0 refills | Status: DC | PRN

## 2021-03-08 MED ORDER — PHENYLEPHRINE HCL-NACL 10-0.9 MG/250ML-% IV SOLN
INTRAVENOUS | Status: DC | PRN
  Administered 2021-03-08: 40 ug/min via INTRAVENOUS

## 2021-03-08 MED ORDER — PROPOFOL 10 MG/ML IV BOLUS
INTRAVENOUS | Status: AC
  Filled 2021-03-08: qty 20

## 2021-03-08 MED ORDER — SUGAMMADEX SODIUM 200 MG/2ML IV SOLN
INTRAVENOUS | Status: DC | PRN
  Administered 2021-03-08: 200 mg via INTRAVENOUS

## 2021-03-08 MED ORDER — ROCURONIUM BROMIDE 10 MG/ML (PF) SYRINGE
PREFILLED_SYRINGE | INTRAVENOUS | Status: DC | PRN
  Administered 2021-03-08: 10 mg via INTRAVENOUS
  Administered 2021-03-08: 50 mg via INTRAVENOUS

## 2021-03-08 MED ORDER — LACTATED RINGERS IV BOLUS
500.0000 mL | Freq: Once | INTRAVENOUS | Status: AC
  Administered 2021-03-08: 500 mL via INTRAVENOUS

## 2021-03-08 MED ORDER — FENTANYL CITRATE (PF) 100 MCG/2ML IJ SOLN: 25.0000 ug | INTRAMUSCULAR | Status: DC | PRN

## 2021-03-08 MED ORDER — MIDAZOLAM HCL 2 MG/2ML IJ SOLN
1.0000 mg | INTRAMUSCULAR | Status: AC
  Administered 2021-03-08: 1 mg via INTRAVENOUS
  Filled 2021-03-08: qty 2

## 2021-03-08 MED ORDER — LACTATED RINGERS IV BOLUS
250.0000 mL | Freq: Once | INTRAVENOUS | Status: AC
  Administered 2021-03-08: 250 mL via INTRAVENOUS

## 2021-03-08 SURGICAL SUPPLY — 64 items
BAG COUNTER SPONGE SURGICOUNT (BAG) ×2 IMPLANT
BASEPLATE GLENOID STD REV 42 (Joint) ×2 IMPLANT
BASEPLATE SHOULDER FW 15D 29 (Joint) ×2 IMPLANT
BIT DRILL 3.2 PERIPHERAL SCREW (BIT) ×2 IMPLANT
BLADE SAW SAG 73X25 THK (BLADE) ×1
BLADE SAW SGTL 73X25 THK (BLADE) ×1 IMPLANT
CHLORAPREP W/TINT 26 (MISCELLANEOUS) ×4 IMPLANT
CLSR STERI-STRIP ANTIMIC 1/2X4 (GAUZE/BANDAGES/DRESSINGS) ×2 IMPLANT
COOLER ICEMAN CLASSIC (MISCELLANEOUS) IMPLANT
COVER BACK TABLE 60X90IN (DRAPES) IMPLANT
COVER SURGICAL LIGHT HANDLE (MISCELLANEOUS) ×2 IMPLANT
DRAPE INCISE IOBAN 66X45 STRL (DRAPES) ×2 IMPLANT
DRAPE ORTHO SPLIT 77X108 STRL (DRAPES) ×2
DRAPE SHEET LG 3/4 BI-LAMINATE (DRAPES) ×4 IMPLANT
DRAPE SURG ORHT 6 SPLT 77X108 (DRAPES) ×2 IMPLANT
DRSG AQUACEL AG ADV 3.5X 6 (GAUZE/BANDAGES/DRESSINGS) ×2 IMPLANT
ELECT BLADE TIP CTD 4 INCH (ELECTRODE) ×2 IMPLANT
ELECT REM PT RETURN 15FT ADLT (MISCELLANEOUS) ×2 IMPLANT
FACESHIELD WRAPAROUND (MASK) ×2 IMPLANT
GLOVE SRG 8 PF TXTR STRL LF DI (GLOVE) ×1 IMPLANT
GLOVE SURG ENC MOIS LTX SZ6.5 (GLOVE) ×4 IMPLANT
GLOVE SURG LTX SZ8 (GLOVE) ×4 IMPLANT
GLOVE SURG UNDER POLY LF SZ6.5 (GLOVE) ×2 IMPLANT
GLOVE SURG UNDER POLY LF SZ8 (GLOVE) ×1
GOWN STRL REUS W/TWL LRG LVL3 (GOWN DISPOSABLE) ×2 IMPLANT
GOWN STRL REUS W/TWL XL LVL3 (GOWN DISPOSABLE) ×2 IMPLANT
GUIDEWIRE GLENOID 2.5X220 (WIRE) ×2 IMPLANT
HANDPIECE INTERPULSE COAX TIP (DISPOSABLE) ×1
HEMOSTAT SURGICEL 2X14 (HEMOSTASIS) IMPLANT
IMPL REVERSE SHOULDER 0X3.5 (Shoulder) ×1 IMPLANT
IMPLANT REVERSE SHOULDER 0X3.5 (Shoulder) ×2 IMPLANT
INSERT SHLD REV 42X6 ANGLE B (Insert) ×2 IMPLANT
KIT BASIN OR (CUSTOM PROCEDURE TRAY) ×2 IMPLANT
KIT STABILIZATION SHOULDER (MISCELLANEOUS) ×2 IMPLANT
KIT TURNOVER KIT A (KITS) ×2 IMPLANT
MANIFOLD NEPTUNE II (INSTRUMENTS) ×2 IMPLANT
NEEDLE MAYO CATGUT SZ4 (NEEDLE) IMPLANT
NS IRRIG 1000ML POUR BTL (IV SOLUTION) ×2 IMPLANT
PACK SHOULDER (CUSTOM PROCEDURE TRAY) ×2 IMPLANT
PAD COLD SHLDR WRAP-ON (PAD) IMPLANT
PENCIL SMOKE EVACUATOR (MISCELLANEOUS) IMPLANT
RESTRAINT HEAD UNIVERSAL NS (MISCELLANEOUS) ×2 IMPLANT
SCREW 5.5X22 (Screw) ×2 IMPLANT
SCREW 5.5X26 (Screw) ×6 IMPLANT
SCREW CENTRAL THREAD 6.5X45 (Screw) ×2 IMPLANT
SET HNDPC FAN SPRY TIP SCT (DISPOSABLE) ×1 IMPLANT
SLING ARM IMMOBILIZER MED (SOFTGOODS) ×2 IMPLANT
SLING ULTRA II L (ORTHOPEDIC SUPPLIES) IMPLANT
SLING ULTRA III MED (ORTHOPEDIC SUPPLIES) ×2 IMPLANT
STEM HUMERAL AEQUALIS 5BX82 (Stem) ×2 IMPLANT
SUCTION FRAZIER HANDLE 12FR (TUBING) ×1
SUCTION TUBE FRAZIER 12FR DISP (TUBING) ×1 IMPLANT
SUT ETHIBOND 2 V 37 (SUTURE) ×2 IMPLANT
SUT ETHIBOND NAB CT1 #1 30IN (SUTURE) ×2 IMPLANT
SUT FIBERWIRE #5 38 CONV NDL (SUTURE)
SUT MNCRL AB 4-0 PS2 18 (SUTURE) ×2 IMPLANT
SUT VIC AB 0 CT1 36 (SUTURE) IMPLANT
SUT VIC AB 3-0 SH 27 (SUTURE) ×1
SUT VIC AB 3-0 SH 27X BRD (SUTURE) ×1 IMPLANT
SUTURE FIBERWR #5 38 CONV NDL (SUTURE) IMPLANT
TAPE STRIPS DRAPE STRL (GAUZE/BANDAGES/DRESSINGS) ×2 IMPLANT
TOWEL OR 17X26 10 PK STRL BLUE (TOWEL DISPOSABLE) ×2 IMPLANT
TUBE SUCTION HIGH CAP CLEAR NV (SUCTIONS) ×2 IMPLANT
WATER STERILE IRR 1000ML POUR (IV SOLUTION) ×4 IMPLANT

## 2021-03-08 NOTE — Anesthesia Procedure Notes (Addendum)
Anesthesia Regional Block: Interscalene brachial plexus block   Pre-Anesthetic Checklist: , timeout performed,  Correct Patient, Correct Site, Correct Laterality,  Correct Procedure, Correct Position, site marked,  Risks and benefits discussed,  Surgical consent,  Pre-op evaluation,  At surgeon's request and post-op pain management  Laterality: Upper and Left  Prep: Maximum Sterile Barrier Precautions used, chloraprep       Needles:  Injection technique: Single-shot  Needle Type: Echogenic Needle     Needle Length: 5cm  Needle Gauge: 21     Additional Needles:   Procedures:,,,, ultrasound used (permanent image in chart),,    Narrative:  Start time: 03/08/2021 1:01 PM End time: 03/08/2021 1:08 PM Injection made incrementally with aspirations every 5 mL.  Performed by: Personally  Anesthesiologist: Barnet Glasgow, MD  Additional Notes: Block assessed prior to procedure. Patient tolerated procedure well.

## 2021-03-08 NOTE — Discharge Instructions (Signed)
Ophelia Charter MD, MPH Noemi Chapel, PA-C [facility] [address] [phone] (tel)   [phone] (fax)   [place] may leave the operative dressing in place until your follow-up appointment. KEEP THE INCISIONS CLEAN AND DRY. There may be a small amount of fluid/bleeding leaking at the surgical site. This is normal after surgery.  If it fills with liquid or blood please call us immediately to change it for you. Use the provided ice machine or Ice packs as often as possible for the first 3-4 days, then as needed for pain relief.  Keep a layer of cloth or a shirt between your skin and the cooling unit to prevent frost bite as it can get very cold.  SHOWERING: - You may shower on Post-Op Day #2.  - The dressing is water resistant but do not scrub it as it may start to peel up.   - You may remove the sling for showering, but keep a water resistant pillow under the arm to keep both the  elbow and shoulder away from the body (mimicking the abduction sling).  - Gently pat the area dry.  - Do not soak the shoulder in water. Do not go swimming in the pool or ocean until your incision has completely healed (about 4-6 weeks after surgery) - KEEP THE INCISIONS CLEAN AND DRY.  EXERCISES Wear the sling at all times You may remove the sling for showering, but keep the arm across the chest or in a secondary sling.    Accidental/Purposeful External Rotation and shoulder flexion (reaching behind you) is to be avoided at all costs for the first month. It is ok to come out of your sling if your are sitting and have assistance for eating.   Do not lift anything heavier than 1 pound until we discuss it further in clinic.   REGIONAL ANESTHESIA (NERVE BLOCKS) The anesthesia team may have performed a nerve block for you if safe in the setting of your care.  This is a great tool used to minimize pain.   Typically the block may start wearing off overnight but the long acting medicine may last for 3-4 days.  The nerve block wearing off can be a challenging period but please utilize your as needed pain medications to try and manage this period.    POST-OP MEDICATIONS- Multimodal approach to pain control In general your pain will be controlled with a combination of substances.  Prescriptions unless otherwise discussed are electronically sent to your pharmacy.  This is a carefully made plan we use to minimize narcotic use.     Acetaminophen - Non-narcotic pain medicine taken on a scheduled basis  Oxycodone - This is a strong narcotic, to be used only on an "as needed" basis for SEVERE pain. Robaxin - this is a muscle relaxer, take as needed for muscle spasms Zofran -  take as needed for nausea   FOLLOW-UP If you develop a Fever (>101.5), Redness or Drainage from the surgical incision site, please call our office to arrange for an evaluation. Please call the office to schedule a follow-up appointment for a wound check, 7-10 days post-operatively.  IF YOU HAVE ANY QUESTIONS, PLEASE FEEL FREE TO CALL OUR OFFICE.  HELPFUL INFORMATION  If you had a block, it will wear off between 8-24 hrs postop typically.  This is period when your pain may go from nearly zero to the pain you would  have had post-op without the block.  This is an abrupt transition but nothing dangerous is happening.  You may take an extra dose of narcotic when this happens.  Your arm will be in a sling following surgery. You will be in this sling for the next 4 weeks.  I will let you know the exact duration at your follow-up visit.  You may be more comfortable sleeping in a semi-seated position the first few nights following surgery.  Keep a pillow propped under the elbow and forearm for comfort.  If you have a recliner type of chair it might be beneficial.  If not that is fine too, but it would be helpful to sleep propped up with  pillows behind your operated shoulder as well under your elbow and forearm.  This will reduce pulling on the suture lines.  When dressing, put your operative arm in the sleeve first.  When getting undressed, take your operative arm out last.  Loose fitting, button-down shirts are recommended.  In most states it is against the law to drive while your arm is in a sling. And certainly against the law to drive while taking narcotics.  You may return to work/school in the next couple of days when you feel up to it. Desk work and typing in the sling is fine.  We suggest you use the pain medication the first night prior to going to bed, in order to ease any pain when the anesthesia wears off. You should avoid taking pain medications on an empty stomach as it will make you nauseous.  Do not drink alcoholic beverages or take illicit drugs when taking pain medications.  Pain medication may make you constipated.  Below are a few solutions to try in this order: Decrease the amount of pain medication if you aren't having pain. Drink lots of decaffeinated fluids. Drink prune juice and/or each dried prunes  If the first 3 don't work start with additional solutions Take Colace - an over-the-counter stool softener Take Senokot - an over-the-counter laxative Take Miralax - a stronger over-the-counter laxative   Dental Antibiotics:  In most cases prophylactic antibiotics for Dental procdeures after total joint surgery are not necessary.  Exceptions are as follows:  1. History of prior total joint infection  2. Severely immunocompromised (Organ Transplant, cancer chemotherapy, Rheumatoid biologic meds such as [place])  3. Poorly controlled diabetes (A1C &gt; 8.0, blood glucose over 200)  If you have one of these conditions, contact your surgeon for an antibiotic prescription, prior to your dental procedure.   For more information including helpful videos and documents visit our website:    [email or web address]

## 2021-03-08 NOTE — Transfer of Care (Signed)
Immediate Anesthesia Transfer of Care Note  Patient: [name]  Procedure(s) Performed: REVERSE SHOULDER ARTHROPLASTY (Left: Shoulder)  Patient Location: PACU  Anesthesia Type:General  Level of Consciousness: awake, alert  and oriented  Airway & Oxygen Therapy: Patient Spontanous Breathing and Patient connected to face mask oxygen  Post-op Assessment: Report given to RN, Post -op Vital signs reviewed and stable and Patient moving all extremities X 4  Post vital signs: Reviewed and stable  Last Vitals:  Vitals Value Taken Time  BP 144/78 03/08/21 1553  Temp    Pulse 65 03/08/21 1555  Resp 25 03/08/21 1555  SpO2 100 % 03/08/21 1555  Vitals shown include unvalidated device data.  Last Pain:  Vitals:   03/08/21 1230  TempSrc:   PainSc: 0-No pain      Patients Stated Pain Goal: 4 ([id number])  Complications: No notable events documented.

## 2021-03-08 NOTE — Interval H&P Note (Signed)
History and Physical Interval Note:  03/08/2021 1:43 PM  Adrian Franco  has presented today for surgery, with the diagnosis of djd left shoulder.  The various methods of treatment have been discussed with the patient and family. After consideration of risks, benefits and other options for treatment, the patient has consented to  Procedure(s): REVERSE SHOULDER ARTHROPLASTY (Left) as a surgical intervention.  The patient's history has been reviewed, patient examined, no change in status, stable for surgery.  I have reviewed the patient's chart and labs.  Questions were answered to the patient's satisfaction.     Hiram Gash

## 2021-03-08 NOTE — Progress Notes (Signed)
AssistedDr. Valma Cava with left, ultrasound guided, interscalene  block. Side rails up, monitors on throughout procedure. See vital signs in flow sheet. Tolerated Procedure well.

## 2021-03-08 NOTE — Anesthesia Procedure Notes (Signed)
Procedure Name: Intubation Date/Time: 03/08/2021 2:26 PM Performed by: Niel Hummer, CRNA Pre-anesthesia Checklist: Patient identified, Emergency Drugs available, Suction available and Patient being monitored Patient Re-evaluated:Patient Re-evaluated prior to induction Oxygen Delivery Method: Circle system utilized Preoxygenation: Pre-oxygenation with 100% oxygen Induction Type: IV induction Ventilation: Mask ventilation without difficulty Laryngoscope Size: Mac and 4 Grade View: Grade I Tube type: Oral Number of attempts: 1 Airway Equipment and Method: Stylet Placement Confirmation: positive ETCO2, breath sounds checked- equal and bilateral and ETT inserted through vocal cords under direct vision Secured at: 23 cm Tube secured with: Tape Dental Injury: Teeth and Oropharynx as per pre-operative assessment

## 2021-03-08 NOTE — Op Note (Signed)
Orthopaedic Surgery Operative Note (CSN: [id number])  Adrian Franco  26-Mar-1946 Date of Surgery: 03/08/2021   Diagnoses:  Left B2 glenoid, primary glenohumeral arthritis  Procedure: Left reverse  Total Shoulder Arthroplasty   Operative Finding Successful completion of planned procedure.  Patient's rotator cuff was intact however with his significant posterior wear we felt that he would be benefited by a reverse shoulder arthroplasty as its more predictable as compared to a total shoulder in this age population.  His actual nerve tug test was normal throughout the case, his glenoid bone was quite sclerotic but we had good fixation.  His humeral bone was quite resorbed and the cancellous portion and the medial calcar area was relatively soft but we achieved good fixation with a short stem.  Post-operative plan: The patient will be NWB in sling.  The patient will be will be discharged from PACU if continues to be stable as was plan prior to surgery.  DVT prophylaxis Aspirin 81 mg twice daily for 6 weeks.  Pain control with PRN pain medication preferring oral medicines.  Follow up plan will be scheduled in approximately 7 days for incision check and XR.  Physical therapy to start after first visit.  Implants: Tornier size 5 short flex stem, 0 high offset tray with 42+6 poly-, 42 glenosphere standard, 29 full wedge implant with a 45 center screw and 4 peripheral screws  Post-Op Diagnosis: Same Surgeons:Primary: Hiram Gash, MD Assistants:Caroline McBane PA-C Location: [facility] ROOM 06 Anesthesia: General with Exparel Interscalene Antibiotics: Ancef 2g preop, Vancomycin '1000mg'$  locally Tourniquet time: None Estimated Blood Loss: 123XX123 Complications: None Specimens: None Implants: Implant Name Type Inv. Item Serial No. Manufacturer Lot No. LRB No. Used Action  BASEPLATE SHOULDER FW [id number] 29 - [id number] Joint BASEPLATE SHOULDER FW [id number] [employer]  Left 1 Implanted  SCREW CENTRAL THREAD  6.5X45 - [id number] Screw SCREW CENTRAL THREAD 6.5X45  [employer]  Left 1 Implanted  BASEPLATE GLENOID STD REV 42 - [id number] Joint BASEPLATE GLENOID STD REV 42 [id number] [employer]  Left 1 Implanted  SCREW 5.5X26 - [id number] Screw SCREW 5.5X26  [employer]  Left 3 Implanted  SCREW 5.5X22 - [id number] Screw SCREW 5.5X22  [employer]  Left 1 Implanted  INSERT SHLD REV 42X6 ANGLE B - [id number] Insert INSERT SHLD REV 42X6 ANGLE B [id number] [employer]  Left 1 Implanted  STEM HUMERAL AEQUALIS 5BX82 - [id number] Stem STEM HUMERAL AEQUALIS 5BX82 [id number] [employer]  Left 1 Implanted  IMPLANT REVERSE SHOULDER 0X3.5 [id number] Shoulder IMPLANT REVERSE SHOULDER 0X3.5 [id number] [employer]  Left 1 Implanted    Indications for Surgery:   Adrian Franco is a 75 y.o. male with end-stage glenohumeral arthritis with a B2 glenoid and significant posterior wear.  Benefits and risks of operative and nonoperative management were discussed prior to surgery with patient/guardian(s) and informed consent form was completed.  Infection and need for further surgery were discussed as was prosthetic stability and cuff issues.  We additionally specifically discussed risks of axillary nerve injury, infection, periprosthetic fracture, continued pain and longevity of implants prior to beginning procedure.      Procedure:   The patient was identified in the preoperative holding area where the surgical site was marked. Block placed by anesthesia with exparel.  The patient was taken to the OR where a procedural timeout was called and the above noted anesthesia was induced.  The patient was positioned beachchair on allen table with spider arm positioner.  Preoperative antibiotics  were dosed.  The patient's left shoulder was prepped and draped in the usual sterile fashion.  A second preoperative timeout was called.       Standard deltopectoral approach was performed with a #10 blade. We dissected down to the  subcutaneous tissues and the cephalic vein was taken laterally with the deltoid. Clavipectoral fascia was incised in line with the incision. Deep retractors were placed. The long of the biceps tendon was identified and there was significant tenosynovitis present.  Tenodesis was performed to the pectoralis tendon with #2 Ethibond. The remaining biceps was followed up into the rotator interval where it was released.   The subscapularis was taken down in a full thickness layer with capsule along the humeral neck extending inferiorly around the humeral head. We continued releasing the capsule directly off of the osteophytes inferiorly all the way around the corner. This allowed [place] to dislocate the humeral head.   The humeral head had evidence of severe osteoarthritic wear with full-thickness cartilage loss and exposed subchondral bone. There was significant flattening of the humeral head.   The rotator cuff was intact however due to the patient's posterior erosion and his age we felt that a reverse was indicated.    There were osteophytes along the inferior humeral neck. The osteophytes were removed with an osteotome and a rongeur.  Osteophytes were removed with a rongeur and an osteotome and the anatomic neck was well visualized.     A humeral cutting guide was inserted down the intramedullary canal. The version was set at 20 of retroversion. Humeral osteotomy was performed with an oscillating saw. The head fragment was passed off the back table. A starter awl was used to open the humeral canal. We next used T-handle straight sound reamers to ream up to an appropriate fit. A chisel was used to remove proximal humeral bone. We then broached starting with a size one broach and broaching up to 5 which obtained an appropriate fit. The broach handle was removed. A cut protector was placed. The broach handle was removed and a cut protector was placed. The humerus was retracted posteriorly and we turned our  attention to glenoid exposure.  The subscapularis was again identified and immediately we took care to palpate the axillary nerve anteriorly and verify its position with gentle palpation as well as the tug test.  We then released the SGHL with bovie cautery prior to placing a curved mayo at the junction of the anterior glenoid well above the axillary nerve and bluntly dissecting the subscapularis from the capsule.  We then carefully protected the axillary nerve as we gently released the inferior capsule to fully mobilize the subscapularis.  An anterior deltoid retractor was then placed as well as a small Hohmann retractor superiorly.   The glenoid was inspected and had evidence of severe osteoarthritic wear with full-thickness cartilage loss and exposed subchondral bone.   The remaining labrum was removed circumferentially taking great care not to disrupt the posterior capsule.   At this point we felt based on blueprint templating that a full wedge augment was necessary.  We began by using a full wedge guide to place our center pin as was templated.  We had good position of this pin and we proceeded with our starter center drill.  This allowed for [place] to use the 15 degree full wedge reamer obtaining circumferential witness marks and good bone preparation for ingrowth.  At this point we proceeded with our center drill and had an intact vault.  We then drilled our center screw to a length of 45 mm.    We selected a 6.5 mm x 45 mm screw and the full wedge baseplate which was placed in the same orientation as our reaming.  We double checked that we had good apposition of the base plate to bone and then proceeded to place 3 locking screws and one nonlocking screw as is typical. We turned attention back to the humeral side. The cut protector was removed. We trialed with multiple size tray and polyethylene options and selected a 6 which provided good stability and range of motion without excess soft tissue  tension. The offset was dialed in to match the normal anatomy. The shoulder was trialed.  There was good ROM in all planes and the shoulder was stable with no inferior translation.  The real humeral implants were opened after again confirming sizes.  The trial was removed. #5 Fiberwire x4 sutures passed through the humeral neck for subscap repair. The humeral component was press-fit obtaining a secure fit. A +0 high offset tray was selected and impacted onto the stem.  A 42+6 polyethylene liner was impacted onto the stem.  The joint was reduced and thoroughly irrigated with pulsatile lavage. Subscap was repaired back with #5 Fiberwire sutures through bone tunnels. Hemostasis was obtained. The deltopectoral interval was reapproximated with #1 Ethibond. The subcutaneous tissues were closed with 2-0 Vicryl and the skin was closed with running monocryl.    The wounds were cleaned and dried and an Aquacel dressing was placed. The drapes taken down. The arm was placed into sling with abduction pillow. Patient was awakened, extubated, and transferred to the recovery room in stable condition. There were no intraoperative complications. The sponge, needle, and attention counts were  correct at the end of the case.       Noemi Chapel, PA-C, present and scrubbed throughout the case, critical for completion in a timely fashion, and for retraction, instrumentation, closure.

## 2021-03-08 NOTE — Anesthesia Postprocedure Evaluation (Signed)
Anesthesia Post Note  Patient: Adrian Franco  Procedure(s) Performed: REVERSE SHOULDER ARTHROPLASTY (Left: Shoulder)     Patient location during evaluation: PACU Anesthesia Type: General Level of consciousness: awake and alert Pain management: pain level controlled Vital Signs Assessment: post-procedure vital signs reviewed and stable Respiratory status: spontaneous breathing, nonlabored ventilation, respiratory function stable and patient connected to nasal cannula oxygen Cardiovascular status: blood pressure returned to baseline and stable Postop Assessment: no apparent nausea or vomiting Anesthetic complications: no   No notable events documented.  Last Vitals:  Vitals:   03/08/21 1632 03/08/21 1640  BP: 130/63 (!) 142/79  Pulse: 61 61  Resp: 18 16  Temp:    SpO2: 95% 98%    Last Pain:  Vitals:   03/08/21 1640  TempSrc:   PainSc: 2                  Barnet Glasgow

## 2021-03-09 ENCOUNTER — Encounter (HOSPITAL_COMMUNITY): Payer: Self-pay | Admitting: Orthopaedic Surgery

## 2021-04-11 ENCOUNTER — Other Ambulatory Visit: Payer: Self-pay | Admitting: Interventional Cardiology

## 2021-04-12 MED ORDER — IRBESARTAN 75 MG PO TABS: ORAL_TABLET | ORAL | 2 refills | Status: DC

## 2021-04-27 ENCOUNTER — Ambulatory Visit (INDEPENDENT_AMBULATORY_CARE_PROVIDER_SITE_OTHER): Payer: Medicare Other | Admitting: Adult Health

## 2021-04-27 VITALS — BP 129/65 | HR 54 | Ht 68.0 in | Wt 198.6 lb

## 2021-04-27 DIAGNOSIS — Z4549 Encounter for adjustment and management of other implanted nervous system device: Secondary | ICD-10-CM

## 2021-04-27 DIAGNOSIS — G4733 Obstructive sleep apnea (adult) (pediatric): Secondary | ICD-10-CM | POA: Diagnosis not present

## 2021-04-27 NOTE — Progress Notes (Signed)
PATIENT: Adrian Franco DOB: April 24, 1946  REASON FOR VISIT: follow up HISTORY FROM: patient PRIMARY NEUROLOGIST: Dr. Brett Fairy  HISTORY OF PRESENT ILLNESS: Today 04/27/21:  Adrian Franco is a 75 year old male with a history of obstructive sleep apnea on CPAP.  He returns today for follow-up.  He reports that he is trying to use the inspire every night.  He does report that some nights he will wake up and he will have discomfort up under the tongue.  Therefore sometimes he turns it off.  He is also been adjusting his voltage.  He states that when he is at 1.8 he notices more pain under the tongue.  States that he feels more comfortable at 1.6 but he does not see the benefit the next day is much as he does when he is at 1.8.  He also feels at times that there may be blisters on the end of the time but when he looks nothing is there.  He returns today for an evaluation.  HISTORY 07-27-2020: Adrian Franco returns today on 07-27-20 for a follow-up visit after his inspire in lab titration.  Incoming setting at the time was 1.2 V.  Sleep efficiency was great at 87.1% the patient's apnea index remained at 3.5/h over the night however he responded best to a final voltage of 1.8 V.  With] [residual apneas occurred only in supine sleep. The wife reports today he still snores, and loud when in supine. Wife has supine dependent apnea, very mild. He is now approaching level 2, 1.3 Volt after today's re- adjustment. He was instructed to slowly go up to 1.8V.  So his amplitude today as he leaves here will be 1.3 V lower limit 1.2 upper limit 1.9 V he has a start delay or ramp time of 30 minutes he has a pause time of 15 minutes which she uses frequently since he still has some nocturia breaks, therapy duration is 9 hours.   He is status post COVID 19 infect in Nov 2020- fully vaccinated.      03-31-2020 activation visit for INSPIRE-    Adrian Franco, born on [birth year] presented today for  inspire therapy session and activation following implantation.  The amplitude was now set from 0 to 0.6 V, patient control will be between 0.6 and 1.6 V pulse width and microseconds state at 90 s before this visit and after the rate of stimulation and hertz remained at 33 Hz before and after pause time at 15 minutes start delay at 30 minutes.  Therapy duration was set from 8 to 9 hours nocturnal stimulation with automatic check of based on the patient's reported sleep time.   Sensing for exhalation will be 7 minutes for 2-1 and has stayed at this level inhalation from zero to +1 and has stayed at this level.  Off.  38% alternating with thirteen has stayed at this level maximum stimulation time in seconds is 4 seconds and has remained there is no inversion.   Initial activation and stimulation settings have changed for amplitude, patient control and therapy duration only tongue motion was witnessed and was reported as comfortable.   The generator serial number is air [id number].  The testing of waveforms showed excellent response.  The patient will be fully titrated to inspire during a sleep study to find the goal setting.   REVIEW OF SYSTEMS: Out of a complete 14 system review of symptoms, the patient complains only of the following symptoms,  and all other reviewed systems are negative.  ALLERGIES: Allergies  Allergen Reactions   Sulfa Antibiotics Itching   Amoxapine And Related Hives and Rash   Lotensin [Benazepril Hcl] Hives and Rash    HOME MEDICATIONS: Outpatient Medications Prior to Visit  Medication Sig Dispense Refill   Ascorbic Acid (VITAMIN C) 1000 MG tablet Take 1,000 mg by mouth in the morning.     Cholecalciferol (VITAMIN D) 50 MCG (2000 UT) tablet Take 2,000 Units by mouth daily.     Collagen-Vitamin C (SUPER COLLAGEN PLUS VITAMIN C PO) Take 1 tablet by mouth daily.     Cyanocobalamin (B-12) 5000 MCG CAPS Take 5,000 mcg by mouth daily.     docusate sodium (COLACE) 100 MG capsule  Take 200 mg by mouth daily.     irbesartan (AVAPRO) 75 MG tablet TAKE 1 TABLET BY MOUTH EVERY DAY *DOSE INCREASE* 90 tablet 2   LORazepam (ATIVAN) 0.5 MG tablet Take 0.5 mg by mouth at bedtime.     methocarbamol (ROBAXIN) 500 MG tablet Take 1 tablet (500 mg total) by mouth every 8 (eight) hours as needed for muscle spasms. 20 tablet 0   Multiple Vitamins-Minerals (ONE-A-DAY MENS 50+ ADVANTAGE) TABS Take 1 tablet by mouth daily with breakfast.     omeprazole (PRILOSEC) 40 MG capsule Take 40 mg by mouth daily.     oxymetazoline (AFRIN) 0.05 % nasal spray Place 1 spray into both nostrils at bedtime.     Polyvinyl Alcohol-Povidone (REFRESH OP) Place 1 drop into the left eye daily.     pregabalin (LYRICA) 50 MG capsule Take 50 mg by mouth 2 (two) times daily.     rivaroxaban (XARELTO) 20 MG TABS tablet Take 1 tablet (20 mg total) by mouth daily with supper. (Patient taking differently: Take 20 mg by mouth at bedtime.) 90 tablet 3   rOPINIRole (REQUIP) 4 MG tablet Take 4 mg by mouth at bedtime.     simvastatin (ZOCOR) 40 MG tablet TAKE 1 TABLET EVERY DAY (Patient taking differently: Take 40 mg by mouth daily.) 90 tablet 3   tamsulosin (FLOMAX) 0.4 MG CAPS capsule Take 0.4 mg by mouth 2 (two) times daily.     Testosterone Cypionate 200 MG/ML SOLN Inject 200 mg into the muscle every 14 (fourteen) days.      No facility-administered medications prior to visit.    PAST MEDICAL HISTORY: Past Medical History:  Diagnosis Date   Aneurysm ([place])    Anxiety    Aortic aneurysm ([facility])    followed by Dr Johnsie Cancel   Arthritis    Atypical chest pain    BPH (benign prostatic hypertrophy)    Chronic back pain    Depression    DVT (deep venous thrombosis) ([facility])    GERD (gastroesophageal reflux disease)    History of kidney stones    HTN (hypertension)    Hyperlipemia    Hypogonadism in male    Insomnia    Localized edema    PE (pulmonary embolism)    Renal insufficiency    Restless leg syndrome    Sleep  apnea    severe OSA-cannot tolerate CPAP   SOB (shortness of breath)    Testicular hypofunction     PAST SURGICAL HISTORY: Past Surgical History:  Procedure Laterality Date   DRUG INDUCED ENDOSCOPY N/A 12/09/2019   Procedure: DRUG INDUCED ENDOSCOPY;  Surgeon: Melida Quitter, MD;  Location: [place];  Service: ENT;  Laterality: N/A;   [place]  Right 02/17/2020   Procedure: IMPLANTATION OF HYPOGLOSSAL NERVE STIMULATOR;  Surgeon: Melida Quitter, MD;  Location: [place];  Service: ENT;  Laterality: Right;   INGUINAL HERNIA REPAIR     KIDNEY STONE SURGERY     stent   REVERSE SHOULDER ARTHROPLASTY Left 03/08/2021   Procedure: REVERSE SHOULDER ARTHROPLASTY;  Surgeon: Hiram Gash, MD;  Location: [facility];  Service: Orthopedics;  Laterality: Left;   ROTATOR CUFF REPAIR Right     FAMILY HISTORY: Family History  Problem Relation Age of Onset   Hyperlipidemia Mother    Hyperlipidemia Father    Heart attack Father    Healthy Son    Healthy Brother    Other Brother    Healthy Sister    Healthy Son     SOCIAL HISTORY: Social History   Socioeconomic History   Marital status: Married    Spouse name: Not on file   Number of children: Not on file   Years of education: Not on file   Highest education level: Not on file  Occupational History   Not on file  Tobacco Use   Smoking status: Never   Smokeless tobacco: Never  Vaping Use   Vaping Use: Never used  Substance and Sexual Activity   Alcohol use: No    Alcohol/week: 0.0 standard drinks   Drug use: No   Sexual activity: Yes    Comment: married  Other Topics Concern   Not on file  Social History Narrative   Not on file   Social Determinants of Health   Financial Resource Strain: Not on file  Food Insecurity: Not on file  Transportation Needs: Not on file  Physical Activity: Not on file  Stress: Not on file  Social Connections: Not on file  Intimate Partner Violence: Not on  file      PHYSICAL EXAM  Vitals:   04/27/21 1246  BP: 129/65  Pulse: (!) 54  Weight: 198 lb 9.6 oz (90.1 kg)  Height: '5\' 8"'$  (1.727 m)   Body mass index is 30.2 kg/m.  Generalized: Well developed, in no acute distress  Chest: Lungs clear to auscultation bilaterally  Neurological examination  Mentation: Alert oriented to time, place, history taking. Follows all commands speech and language fluent Cranial nerve II-XII: Extraocular movements were full, visual field were full on confrontational test Head turning and shoulder shrug  were normal and symmetric. Motor: The motor testing reveals 5 over 5 strength of all 4 extremities. Good symmetric motor tone is noted throughout.  Sensory: Sensory testing is intact to soft touch on all 4 extremities. No evidence of extinction is noted.  Gait and station: Gait is normal.    DIAGNOSTIC DATA (LABS, IMAGING, TESTING) - I reviewed patient records, labs, notes, testing and imaging myself where available.  Lab Results  Component Value Date   WBC 6.3 03/02/2021   HGB 14.8 03/02/2021   HCT 45.9 03/02/2021   MCV 90.9 03/02/2021   PLT 205 03/02/2021      Component Value Date/Time   NA 136 03/02/2021 1056   NA 139 10/20/2020 1156   K 4.5 03/02/2021 1056   CL 100 03/02/2021 1056   CO2 27 03/02/2021 1056   GLUCOSE 100 (H) 03/02/2021 1056   BUN 20 03/02/2021 1056   BUN 18 10/20/2020 1156   CREATININE 1.08 03/02/2021 1056   CALCIUM 9.8 03/02/2021 1056   GFRNONAA >60 03/02/2021 1056   GFRAA 53 (L) 10/20/2020 1156      ASSESSMENT AND PLAN  75 y.o. year old male  has a past medical history of Aneurysm ([place]), Anxiety, Aortic aneurysm ([place]), Arthritis, Atypical chest pain, BPH (benign prostatic hypertrophy), Chronic back pain, Depression, DVT (deep venous thrombosis) ([place]), GERD (gastroesophageal reflux disease), History of kidney stones, HTN (hypertension), Hyperlipemia, Hypogonadism in male, Insomnia, Localized edema, PE (pulmonary  embolism), Renal insufficiency, Restless leg syndrome, Sleep apnea, SOB (shortness of breath), and Testicular hypofunction. here with :  Obstructive sleep apnea with inspire device  Advised the patient that he should increase his voltage to 1.7 try this for couple days and then increase to 1.8.  If he experiences discomfort he can decrease his voltage back to 1.7.  Advised that he should keep trying to work up to 1.8.  Patient voiced understanding.  Advised that if he has any additional issues he should let us know.  Advised that we will call to schedule his next appointment     Ward Givens, MSN, NP-C 04/27/2021, 11:34 AM [facility] [address], [place], [place] [address] [phone]

## 2021-06-15 ENCOUNTER — Telehealth: Payer: Self-pay | Admitting: *Deleted

## 2021-06-15 NOTE — Telephone Encounter (Signed)
   [facility] Pre-operative Risk Assessment    Patient Name: Adrian Franco  DOB: 1945/10/29 MRN: [id number]  HEARTCARE STAFF:  - IMPORTANT!!!!!! Under Visit Info/Reason for Call, type in Other and utilize the format Clearance MM/DD/YY or Clearance TBD. Do not use dashes or single digits. - Please review there is not already an duplicate clearance open for this procedure. - If request is for dental extraction, please clarify the # of teeth to be extracted. - If the patient is currently at the dentist's office, call Pre-Op Callback Staff (MA/nurse) to input urgent request.  - If the patient is not currently in the dentist office, please route to the Pre-Op pool.  Request for surgical clearance:  What type of surgery is being performed?  EPIDURAL STEROID INJECTION  When is this surgery scheduled?  06/29/21  What type of clearance is required (medical clearance vs. Pharmacy clearance to hold med vs. Both)?  BOTH  Are there any medications that need to be held prior to surgery and how long?  Warnell Forester 3 DAYS  Practice name and name of physician performing surgery?  [place] / DR. Davy Pique  What is the office phone number?  [phone]   7.   What is the office fax number?  [phone]  8.   Anesthesia type (None, local, MAC, general) ?     Jeanann Lewandowsky 06/15/2021, 12:37 PM  _________________________________________________________________   (provider comments below)

## 2021-06-16 NOTE — Telephone Encounter (Signed)
Left voice message 8:38 AM on 06/16/2021.  Patient needs call back.

## 2021-06-16 NOTE — Telephone Encounter (Signed)
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 Discussion was had about life long anticoagulation since he had residual DVT and not clear if large PE was unprovoked. Patient feels worse off testosterone."  Procedure: EPIDURAL STEROID INJECTION Date of procedure: 06/29/21  Patient would need to hold Xarelto for 3 days prior to procedure, however, he is high risk off anticoagulation due to his Hx of PE/DVT. We have not been managing his anticoagulation (last Rx sent in 2018). Clearance should be sent to managing provider.

## 2021-06-16 NOTE — Telephone Encounter (Signed)
Wife of patient was returning call to [place].

## 2021-06-16 NOTE — Telephone Encounter (Signed)
   Primary Cardiologist: Jenkins Rouge, MD  Chart reviewed as part of pre-operative protocol coverage. Given past medical history and time since last visit, based on ACC/AHA guidelines, TRAMAR LOSANO would be at acceptable risk for the planned procedure without further cardiovascular testing.   His primary care provider manages his Xarelto.  He will need to receive recommendations for holding Xarelto from his PCP.  Patient was advised that if he develops new symptoms prior to surgery to contact our office to arrange a follow-up appointment.  He verbalized understanding.  I will route this recommendation to the requesting party via Epic fax function and remove from pre-op pool.  Please call with questions.  Jossie Ng. Tarrance Januszewski NP-C    06/16/2021, 10:45 AM [facility] [address] [address] Office [phone] Fax [phone]

## 2021-08-08 ENCOUNTER — Other Ambulatory Visit: Payer: Self-pay

## 2021-08-08 ENCOUNTER — Ambulatory Visit (HOSPITAL_COMMUNITY): Payer: Medicare Other | Attending: Internal Medicine

## 2021-08-08 DIAGNOSIS — I351 Nonrheumatic aortic (valve) insufficiency: Secondary | ICD-10-CM | POA: Diagnosis present

## 2021-08-08 LAB — ECHOCARDIOGRAM COMPLETE
Area-P 1/2: 2.87 cm2
P 1/2 time: 420 msec
S' Lateral: 3.2 cm

## 2021-08-11 ENCOUNTER — Ambulatory Visit (INDEPENDENT_AMBULATORY_CARE_PROVIDER_SITE_OTHER): Payer: Medicare Other | Admitting: Cardiovascular Disease

## 2021-08-11 ENCOUNTER — Other Ambulatory Visit: Payer: Self-pay

## 2021-08-11 ENCOUNTER — Encounter: Payer: Self-pay | Admitting: Cardiovascular Disease

## 2021-08-11 VITALS — BP 130/84 | HR 75 | Ht 69.0 in | Wt 199.4 lb

## 2021-08-11 DIAGNOSIS — I351 Nonrheumatic aortic (valve) insufficiency: Secondary | ICD-10-CM | POA: Diagnosis not present

## 2021-08-11 MED ORDER — SODIUM CHLORIDE 0.9% FLUSH: 3.0000 mL | Freq: Two times a day (BID) | INTRAVENOUS | Status: DC

## 2021-08-11 NOTE — Addendum Note (Signed)
Addended by: Antonieta Iba on: 08/11/2021 05:59 PM   Modules accepted: Orders

## 2021-08-11 NOTE — Addendum Note (Signed)
Addended by: Thayer Headings on: 08/11/2021 06:06 PM   Modules accepted: Orders

## 2021-08-11 NOTE — Patient Instructions (Addendum)
Medication Instructions:  Your physician recommends that you continue on your current medications as directed. Please refer to the Current Medication list given to you today.  *If you need a refill on your cardiac medications before your next appointment, please call your pharmacy*  Lab Work: January 3rd between 7:15am and 5:00pm If you have labs (blood work) drawn today and your tests are completely normal, you will receive your results only by: [place] (if you have MyChart) OR A paper copy in the mail If you have any lab test that is abnormal or we need to change your treatment, we will call you to review the results.   Testing/Procedures: Your physician has requested that you have a TEE. During a TEE, sound waves are used to create images of your heart. It provides your doctor with information about the size and shape of your heart and how well your hearts chambers and valves are working. In this test, a transducer is attached to the end of a flexible tube thats guided down your throat and into your esophagus (the tube leading from you mouth to your stomach) to get a more detailed image of your heart. You are not awake for the procedure. Please see the instruction sheet given to you today. For further information please visit HugeFiesta.tn.  Your physician has requested that you have a cardiac catheterization. Cardiac catheterization is used to diagnose and/or treat various heart conditions. Doctors may recommend this procedure for a number of different reasons. The most common reason is to evaluate chest pain. Chest pain can be a symptom of coronary artery disease (CAD), and cardiac catheterization can show whether plaque is narrowing or blocking your hearts arteries. This procedure is also used to evaluate the valves, as well as measure the blood flow and oxygen levels in different parts of your heart. For further information please visit HugeFiesta.[email or web address]. Please follow  instruction sheet, as given.  Follow-Up: At [facility], you and your health needs are our priority.  As part of our continuing mission to provide you with exceptional heart care, we have created designated Provider Care Teams.  These Care Teams include your primary Cardiologist (physician) and Advanced Practice Providers (APPs -  Physician Assistants and Nurse Practitioners) who all work together to provide you with the care you need, when you need it.  :1}  Other Instructions  [place] OFFICE [place], [place] [address] Dept: [place]: [place]  08/11/2021  You are scheduled for a Cardiac Catheterization on Friday, January 6 with Dr. Irish Lack  1. Please arrive at the [facility] (Main Entrance A) at [facility]: [address] [place], [place] [address]    (This time is two hours before your procedure to ensure your preparation). Free valet parking service is available.   Special note: Every effort is made to have your procedure done on time. Please understand that emergencies sometimes delay scheduled procedures.  2. Diet: Do not eat solid foods after midnight.   3. Labs: You will need to have blood drawn on January 3rd, 2023 between 7:15am and 5:00pm  4. Medication instructions in preparation for your procedure:   Contrast Allergy: No  Stop taking Xarelto (Rivaroxaban) on Wednesday, January 4.  On the morning of your procedure, take your Aspirin and any morning medicines NOT listed above.  You may use sips of water.  5. Plan for one night stay--bring  personal belongings. 6. Bring a current list of your medications and current insurance cards. 7. You MUST have a responsible person to drive you home. 8. Someone MUST be with you the first 24 hours after you arrive home or your discharge will be delayed. 9. Please wear clothes that are  easy to get on and off and wear slip-on shoes.  Thank you for allowing [place] to care for you!   -- [place] [facility]  WE WILL CALL YOU Monday WITH ARRIVAL TIME  You will be scheduled for a TEE on January 6th with [name]. Please arrive at the [facility] (Main Entrance A) at [facility]: [address] [place], [place] [address]. (Arrive 1 hour prior to procedure unless lab work is needed; if lab work is needed arrive 1.5 hours ahead)  DIET: Nothing to eat or drink after midnight except a sip of water with medications (see medication instructions below)  FYI: For your safety, and to allow [place] to monitor your vital signs accurately during the surgery/procedure we request that   if you have artificial nails, gel coating, SNS etc. Please have those removed prior to your surgery/procedure. Not having the nail coverings /polish removed may result in cancellation or delay of your surgery/procedure.  You must have a responsible person to drive you home and stay in the waiting area during your procedure. Failure to do so could result in cancellation.  Bring your insurance cards.  *Special Note: Every effort is made to have your procedure done on time. Occasionally there are emergencies that occur at the hospital that may cause delays. Please be patient if a delay does occur.

## 2021-08-11 NOTE — Progress Notes (Addendum)
Cardiology Office Note:    Date:  08/11/2021   ID:  Adrian Franco, DOB 27-Jun-1946, MRN [id number]  PCP:  Adrian Mallet, MD   [facility] Providers Cardiologist:  Jenkins Rouge, MD {     Referring MD: Adrian Mallet, MD   Chief Complaint  Patient presents with   aortic insufficiency     History of Present Illness:    Pt seen as a Adrian Franco in today .   Adrian Franco is a 75 y.o. male with a hx of aortic aneurysm.  He had an echocardiogram that recently showed aortic insufficiency.  Has moderate  Aortic root dilatation - 47 mm root with 44 mm in the ascending aorta   I have reviewed the echos since 2018. His aotic root and ascending aorta have not changed since at least 2018.  He has previously been seen by Dr. Johnsie Franco.  Dr. Kyla Franco recommendations for that he have a transesophageal echocardiogram and a right left heart catheterization.  He has an echo yearly for his AI The most recent echo showed moderate - severe AI ,   Has DOE if he does any activity . 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 Irish Lack in Feb. 2022 with similar symptoms.  Symptoms are about the same   Hx of pulmonary embolus  Is on xarelto     Past Medical History:  Diagnosis Date   Aneurysm ([place])    Anxiety    Aortic aneurysm ([place])    followed by Dr Adrian Franco   Arthritis    Atypical chest pain    BPH (benign prostatic hypertrophy)    Chronic back pain    Depression    DVT (deep venous thrombosis) (HCC)    GERD (gastroesophageal reflux disease)    History of kidney stones    HTN (hypertension)    Hyperlipemia    Hypogonadism in male    Insomnia    Localized edema    PE (pulmonary embolism)    Renal insufficiency    Restless leg syndrome    Sleep apnea    severe OSA-cannot tolerate CPAP   SOB (shortness of breath)    Testicular hypofunction     Past  Surgical History:  Procedure Laterality Date   DRUG INDUCED ENDOSCOPY N/A 12/09/2019   Procedure: DRUG INDUCED ENDOSCOPY;  Surgeon: Melida Quitter, MD;  Location: [facility];  Service: ENT;  Laterality: N/A;   IMPLANTATION OF HYPOGLOSSAL NERVE STIMULATOR Right 02/17/2020   Procedure: IMPLANTATION OF HYPOGLOSSAL NERVE STIMULATOR;  Surgeon: Melida Quitter, MD;  Location: [place] [facility];  Service: ENT;  Laterality: Right;   INGUINAL HERNIA REPAIR     KIDNEY STONE SURGERY     stent   REVERSE SHOULDER ARTHROPLASTY Left 03/08/2021   Procedure: REVERSE SHOULDER ARTHROPLASTY;  Surgeon: Adrian Gash, MD;  Location: [facility];  Service: Orthopedics;  Laterality: Left;   ROTATOR CUFF REPAIR Right     Current Medications: Current Meds  Medication Sig   Ascorbic Acid (VITAMIN C) 1000 MG tablet Take 1,000 mg by mouth in the morning.   Cholecalciferol (VITAMIN D) 50 MCG (2000 UT) tablet Take 2,000 Units by mouth daily.   Collagen-Vitamin C (SUPER COLLAGEN PLUS VITAMIN C PO) Take 1 tablet by mouth daily.   Cyanocobalamin (B-12) 5000 MCG CAPS Take 5,000 mcg by mouth daily.  docusate sodium (COLACE) 100 MG capsule Take 200 mg by mouth daily.   irbesartan (AVAPRO) 75 MG tablet TAKE 1 TABLET BY MOUTH EVERY DAY *DOSE INCREASE*   LORazepam (ATIVAN) 0.5 MG tablet Take 0.5 mg by mouth at bedtime.   Multiple Vitamins-Minerals (ONE-A-DAY MENS 50+ ADVANTAGE) TABS Take 1 tablet by mouth daily with breakfast.   omeprazole (PRILOSEC) 40 MG capsule Take 40 mg by mouth daily.   oxymetazoline (AFRIN) 0.05 % nasal spray Place 1 spray into both nostrils at bedtime.   Polyvinyl Alcohol-Povidone (REFRESH OP) Place 1 drop into the left eye daily.   pregabalin (LYRICA) 50 MG capsule Take 50 mg by mouth 2 (two) times daily.   rivaroxaban (XARELTO) 20 MG TABS tablet Take 1 tablet (20 mg total) by mouth daily with supper.   rOPINIRole (REQUIP) 4 MG tablet Take 4 mg by mouth at bedtime.   simvastatin (ZOCOR) 40 MG tablet TAKE 1  TABLET EVERY DAY   tamsulosin (FLOMAX) 0.4 MG CAPS capsule Take 0.4 mg by mouth 2 (two) times daily.   Testosterone Cypionate 200 MG/ML SOLN Inject 200 mg into the muscle every 14 (fourteen) days.      Allergies:   Sulfa antibiotics, Amoxapine and related, and Lotensin [benazepril hcl]   Social History   Socioeconomic History   Marital status: Married    Spouse name: Not on file   Number of children: Not on file   Years of education: Not on file   Highest education level: Not on file  Occupational History   Not on file  Tobacco Use   Smoking status: Never   Smokeless tobacco: Never  Vaping Use   Vaping Use: Never used  Substance and Sexual Activity   Alcohol use: No    Alcohol/week: 0.0 standard drinks   Drug use: No   Sexual activity: Yes    Comment: married  Other Topics Concern   Not on file  Social History Narrative   Not on file   Social Determinants of Health   Financial Resource Strain: Not on file  Food Insecurity: Not on file  Transportation Needs: Not on file  Physical Activity: Not on file  Stress: Not on file  Social Connections: Not on file     Family History: The patient's family history includes Healthy in his brother, sister, son, and son; Heart attack in his father; Hyperlipidemia in his father and mother; Other in his brother.  ROS:   Please see the history of present illness.     All other systems reviewed and are negative.  EKGs/Labs/Other Studies Reviewed:    The following studies were reviewed today:   EKG: August 11, 2021: Normal sinus rhythm at 75.  No ST or T wave changes.  Recent Labs: 10/20/2020: NT-Pro BNP 38 03/02/2021: BUN 20; Creatinine, Ser 1.08; Hemoglobin 14.8; Platelets 205; Potassium 4.5; Sodium 136  Recent Lipid Panel No results found for: CHOL, TRIG, HDL, CHOLHDL, VLDL, LDLCALC, LDLDIRECT   Risk Assessment/Calculations:         Physical Exam:    VS:  BP 130/84 (BP Location: Left Arm, Patient Position: Sitting,  Cuff Size: Normal)    Pulse 75    Ht 5\' 9"  (1.753 m)    Wt 199 lb 6.4 oz (90.4 kg)    SpO2 96%    BMI 29.45 kg/m     Wt Readings from Last 3 Encounters:  08/11/21 199 lb 6.4 oz (90.4 kg)  04/27/21 198 lb 9.6 oz (90.1 kg)  03/08/21  198 lb 12.9 oz (90.2 kg)     GEN:  Well nourished, well developed in no acute distress HEENT: Normal NECK: No JVD; No carotid bruits LYMPHATICS: No lymphadenopathy CARDIAC:  RR soft diastolic  murmur. RESPIRATORY:  Clear to auscultation without rales, wheezing or rhonchi  ABDOMEN: Soft, non-tender, non-distended MUSCULOSKELETAL:  No edema; No deformity .  Pulses are fairly normal. SKIN: Warm and dry NEUROLOGIC:  Alert and oriented x 3 PSYCHIATRIC:  Normal affect   ASSESSMENT:    1. Aortic valve insufficiency, etiology of cardiac valve disease unspecified    PLAN:     Aortic insufficiency: His echocardiogram was reviewed by Dr. Johnsie Franco   He thinks that his aortic insufficiency has worsened.  Clair Gulling is having some symptoms of shortness of breath although it should be noted that he symptoms have really been present for the past year.  He has not enlarged aortic root and ascending aorta.  The measurements have not changed at least going back to 2017.  I believe that the echocardiogram since that time and is aortic root and ascending aorta really have not grown much at all.  Dr. Nolon Lennert would like to schedule him for a transesophageal echo as well as a right left heart catheterization with aortography.  I discussed the risk, benefits, options of heart catheterization as well as TEE.  He understands and agrees to proceed.  We will schedule this for January 6.  Dr. Johnsie Franco will be doing the TEE and Dr. Irish Lack will perform the cath     He knows to hold his xarelto the day prior to and the day of the procedure    Shared Decision Making/Informed Consent The risks [stroke (1 in 1000), death (1 in 1000), kidney failure [usually temporary] (1 in 500), bleeding (1  in 200), allergic reaction [possibly serious] (1 in 200)], benefits (diagnostic support and management of coronary artery disease) and alternatives of a cardiac catheterization were discussed in detail with Mr. Boughner and he is willing to proceed.   The risks [esophageal damage, perforation (1:10,000 risk), bleeding, pharyngeal hematoma as well as other potential complications associated with conscious sedation including aspiration, arrhythmia, respiratory failure and death], benefits (treatment guidance and diagnostic support) and alternatives of a transesophageal echocardiogram were discussed in detail with Mr. Reisig and he is willing to proceed.     Medication Adjustments/Labs and Tests Ordered: Current medicines are reviewed at length with the patient today.  Concerns regarding medicines are outlined above.  Orders Placed This Encounter  Procedures   Basic metabolic panel   CBC   EKG 12-Lead    No orders of the defined types were placed in this encounter.     Patient Instructions  Medication Instructions:  Your physician recommends that you continue on your current medications as directed. Please refer to the Current Medication list given to you today.  *If you need a refill on your cardiac medications before your next appointment, please call your pharmacy*  Lab Franco: January 3rd between 7:15am and 5:00pm If you have labs (blood Franco) drawn today and your tests are completely normal, you will receive your results only by: [place] (if you have MyChart) OR A paper copy in the mail If you have any lab test that is abnormal or we need to change your treatment, we will call you to review the results.   Testing/Procedures: Your physician has requested that you have a TEE. During a TEE, sound waves are used to create images of your heart.  It provides your doctor with information about the size and shape of your heart and how well your hearts chambers and valves are  working. In this test, a transducer is attached to the end of a flexible tube thats guided down your throat and into your esophagus (the tube leading from you mouth to your stomach) to get a more detailed image of your heart. You are not awake for the procedure. Please see the instruction sheet given to you today. For further information please visit HugeFiesta.[email or web address].  Your physician has requested that you have a cardiac catheterization. Cardiac catheterization is used to diagnose and/or treat various heart conditions. Doctors may recommend this procedure for a number of different reasons. The most common reason is to evaluate chest pain. Chest pain can be a symptom of coronary artery disease (CAD), and cardiac catheterization can show whether plaque is narrowing or blocking your hearts arteries. This procedure is also used to evaluate the valves, as well as measure the blood flow and oxygen levels in different parts of your heart. For further information please visit HugeFiesta.[email or web address]. Please follow instruction sheet, as given.  Follow-Up: At [facility], you and your health needs are our priority.  As part of our continuing mission to provide you with exceptional heart care, we have created designated Provider Care Teams.  These Care Teams include your primary Cardiologist (physician) and Advanced Practice Providers (APPs -  Physician Assistants and Nurse Practitioners) who all Franco together to provide you with the care you need, when you need it.  :1}  Other Instructions  [place] OFFICE [place], [place] [address] Dept: [place]: [place]  08/11/2021  You are scheduled for a Cardiac Catheterization on Friday, January 6 with Dr. Irish Lack  1. Please arrive at the [facility] (Main Entrance A) at [facility]: [address] [place], [place]  [address]    (This time is two hours before your procedure to ensure your preparation). Free valet parking service is available.   Special note: Every effort is made to have your procedure done on time. Please understand that emergencies sometimes delay scheduled procedures.  2. Diet: Do not eat solid foods after midnight.  The patient may have clear liquids until 5am upon the day of the procedure.  3. Labs: You will need to have blood drawn on January 3rd, 2023 between 7:15am and 5:00pm  4. Medication instructions in preparation for your procedure:   Contrast Allergy: No  Stop taking Xarelto (Rivaroxaban) on Wednesday, January 4.  On the morning of your procedure, take your Aspirin and any morning medicines NOT listed above.  You may use sips of water.  5. Plan for one night stay--bring personal belongings. 6. Bring a current list of your medications and current insurance cards. 7. You MUST have a responsible person to drive you home. 8. Someone MUST be with you the first 24 hours after you arrive home or your discharge will be delayed. 9. Please wear clothes that are easy to get on and off and wear slip-on shoes.  Thank you for allowing [place] to care for you!   -- [place] [facility]  WE WILL CALL YOU Monday WITH ARRIVAL TIME  You will be scheduled for a TEE on January 6th with Dr. Johnsie Franco. Please arrive at the [facility] (Main Entrance A) at [facility]: [place], [place]  [address]. (Arrive 1 hour prior to procedure unless lab Franco is needed; if lab Franco is needed arrive 1.5 hours ahead)  DIET: Nothing to eat or drink after midnight except a sip of water with medications (see medication instructions below)  FYI: For your safety, and to allow [place] to monitor your vital signs accurately during the surgery/procedure we request that   if you have artificial nails, gel coating, SNS etc. Please have those removed prior to your surgery/procedure. Not  having the nail coverings /polish removed may result in cancellation or delay of your surgery/procedure.  You must have a responsible person to drive you home and stay in the waiting area during your procedure. Failure to do so could result in cancellation.  Bring your insurance cards.  *Special Note: Every effort is made to have your procedure done on time. Occasionally there are emergencies that occur at the hospital that may cause delays. Please be patient if a delay does occur.    Signed, Mertie Moores, MD  08/11/2021 5:55 PM    [place]

## 2021-08-15 ENCOUNTER — Telehealth: Payer: Self-pay | Admitting: Cardiovascular Disease

## 2021-08-15 NOTE — Telephone Encounter (Signed)
Called patient's wife back with time to be at the hospital on 09/01/20 for TEE and heart cath. Patient will be there at 11:30 am, [name] will do TEE and Heart Cath is with Dr. Irish Lack.

## 2021-08-15 NOTE — Telephone Encounter (Signed)
New Message:      Wife says pt is scheduled for Cath on 09-01-21. She wants to know what time is his TEE scheduled?

## 2021-08-22 ENCOUNTER — Encounter (HOSPITAL_COMMUNITY): Payer: Self-pay | Admitting: Cardiovascular Disease

## 2021-08-22 NOTE — Progress Notes (Signed)
Attempted to obtain medical history via telephone, unable to reach at this time. I left a voicemail to return pre surgical testing department's phone call.  

## 2021-08-30 ENCOUNTER — Other Ambulatory Visit: Payer: Self-pay | Admitting: Cardiovascular Disease

## 2021-08-30 LAB — CBC
Hematocrit: 44.4 % (ref 37.5–51.0)
Hemoglobin: 14.7 g/dL (ref 13.0–17.7)
MCH: 29.5 pg (ref 26.6–33.0)
MCHC: 33.1 g/dL (ref 31.5–35.7)
MCV: 89 fL (ref 79–97)
Platelets: 210 10*3/uL (ref 150–450)
RBC: 4.99 x10E6/uL (ref 4.14–5.80)
RDW: 12.8 % (ref 11.6–15.4)
WBC: 6.6 10*3/uL (ref 3.4–10.8)

## 2021-08-30 LAB — BASIC METABOLIC PANEL
BUN/Creatinine Ratio: 12 (ref 10–24)
BUN: 14 mg/dL (ref 8–27)
CO2: 25 mmol/L (ref 20–29)
Calcium: 9.5 mg/dL (ref 8.6–10.2)
Chloride: 102 mmol/L (ref 96–106)
Creatinine, Ser: 1.15 mg/dL (ref 0.76–1.27)
Glucose: 88 mg/dL (ref 70–99)
Potassium: 4.7 mmol/L (ref 3.5–5.2)
Sodium: 144 mmol/L (ref 134–144)
eGFR: 67 mL/min/{1.73_m2} (ref >59)

## 2021-08-31 ENCOUNTER — Other Ambulatory Visit: Payer: Self-pay | Admitting: Cardiovascular Disease

## 2021-08-31 ENCOUNTER — Telehealth: Payer: Self-pay | Admitting: *Deleted

## 2021-08-31 DIAGNOSIS — I351 Nonrheumatic aortic (valve) insufficiency: Secondary | ICD-10-CM

## 2021-08-31 NOTE — H&P (View-Only) (Signed)
Orders for tee

## 2021-08-31 NOTE — Progress Notes (Signed)
Orders for tee

## 2021-08-31 NOTE — Telephone Encounter (Signed)
Cardiac catheterization scheduled at [facility] for: Friday September 01, 2021 1:30 PM/TEE 12:30 PM [facility] Main Entrance A [facility]) at: 11:30 AM   Diet-nothing to eat or drink after midnight, may have sips of water to take medications  Medication instructions for procedure: -Hold:  Xarelto-none 08/30/21 until post procedure -Except hold medications usual morning medications can be taken pre-cath with sips of water including aspirin 81 mg.    Confirmed patient has responsible adult to drive home post procedure and be with patient first 24 hours after arriving home.  [facility] does allow one visitor to accompany you and wait in the hospital waiting room while you are there for your procedure. You and your visitor will be asked to wear a mask once you enter the hospital.   Patient reports does not currently have any new symptoms concerning for COVID-19 and no household members with COVID-19 like illness.    Reviewed procedure/mask/visitor instructions with patient.

## 2021-09-01 ENCOUNTER — Other Ambulatory Visit: Payer: Self-pay

## 2021-09-01 ENCOUNTER — Ambulatory Visit (HOSPITAL_COMMUNITY): Payer: Medicare Other | Admitting: Registered Nurse

## 2021-09-01 ENCOUNTER — Ambulatory Visit (HOSPITAL_BASED_OUTPATIENT_CLINIC_OR_DEPARTMENT_OTHER)
Admission: RE | Admit: 2021-09-01 | Discharge: 2021-09-01 | Disposition: A | Discharge status: Home / Self Care | Payer: Medicare Other | Source: Home / Self Care | Attending: Cardiovascular Disease | Admitting: Cardiovascular Disease

## 2021-09-01 ENCOUNTER — Ambulatory Visit (HOSPITAL_COMMUNITY)
Admission: RE | Admit: 2021-09-01 | Discharge: 2021-09-01 | Disposition: A | Discharge status: Home / Self Care | Payer: Medicare Other | Attending: Cardiovascular Disease | Admitting: Cardiovascular Disease

## 2021-09-01 ENCOUNTER — Encounter (HOSPITAL_COMMUNITY)
Admission: RE | Disposition: A | Discharge status: Home / Self Care | Payer: Self-pay | Source: Home / Self Care | Attending: Cardiovascular Disease

## 2021-09-01 DIAGNOSIS — I352 Nonrheumatic aortic (valve) stenosis with insufficiency: Secondary | ICD-10-CM | POA: Diagnosis not present

## 2021-09-01 DIAGNOSIS — I351 Nonrheumatic aortic (valve) insufficiency: Secondary | ICD-10-CM

## 2021-09-01 DIAGNOSIS — R0602 Shortness of breath: Secondary | ICD-10-CM

## 2021-09-01 DIAGNOSIS — I2510 Atherosclerotic heart disease of native coronary artery without angina pectoris: Secondary | ICD-10-CM | POA: Diagnosis not present

## 2021-09-01 HISTORY — PX: TEE WITHOUT CARDIOVERSION: SHX5443

## 2021-09-01 HISTORY — PX: RIGHT/LEFT HEART CATH AND CORONARY ANGIOGRAPHY: CATH118266

## 2021-09-01 LAB — POCT I-STAT 7, (LYTES, BLD GAS, ICA,H+H)
Acid-Base Excess: 2 mmol/L (ref 0.0–2.0)
Acid-Base Excess: 3 mmol/L — ABNORMAL HIGH (ref 0.0–2.0)
Bicarbonate: 27.5 mmol/L (ref 20.0–28.0)
Bicarbonate: 28.2 mmol/L — ABNORMAL HIGH (ref 20.0–28.0)
Calcium, Ion: 1.19 mmol/L (ref 1.15–1.40)
Calcium, Ion: 1.22 mmol/L (ref 1.15–1.40)
HCT: 39 % (ref 39.0–52.0)
HCT: 40 % (ref 39.0–52.0)
Hemoglobin: 13.3 g/dL (ref 13.0–17.0)
Hemoglobin: 13.6 g/dL (ref 13.0–17.0)
O2 Saturation: 89 %
O2 Saturation: 92 %
Potassium: 3.9 mmol/L (ref 3.5–5.1)
Potassium: 4 mmol/L (ref 3.5–5.1)
Sodium: 140 mmol/L (ref 135–145)
Sodium: 140 mmol/L (ref 135–145)
TCO2: 29 mmol/L (ref 22–32)
TCO2: 30 mmol/L (ref 22–32)
pCO2 arterial: 46.1 mmHg (ref 32.0–48.0)
pCO2 arterial: 46.5 mmHg (ref 32.0–48.0)
pH, Arterial: 7.384 (ref 7.350–7.450)
pH, Arterial: 7.39 (ref 7.350–7.450)
pO2, Arterial: 58 mmHg — ABNORMAL LOW (ref 83.0–108.0)
pO2, Arterial: 65 mmHg — ABNORMAL LOW (ref 83.0–108.0)

## 2021-09-01 LAB — POCT I-STAT EG7
Acid-Base Excess: 3 mmol/L — ABNORMAL HIGH (ref 0.0–2.0)
Acid-Base Excess: 3 mmol/L — ABNORMAL HIGH (ref 0.0–2.0)
Bicarbonate: 28.5 mmol/L — ABNORMAL HIGH (ref 20.0–28.0)
Bicarbonate: 28.6 mmol/L — ABNORMAL HIGH (ref 20.0–28.0)
Calcium, Ion: 1.24 mmol/L (ref 1.15–1.40)
Calcium, Ion: 1.26 mmol/L (ref 1.15–1.40)
HCT: 40 % (ref 39.0–52.0)
HCT: 41 % (ref 39.0–52.0)
Hemoglobin: 13.6 g/dL (ref 13.0–17.0)
Hemoglobin: 13.9 g/dL (ref 13.0–17.0)
O2 Saturation: 73 %
O2 Saturation: 75 %
Potassium: 4.1 mmol/L (ref 3.5–5.1)
Potassium: 4.2 mmol/L (ref 3.5–5.1)
Sodium: 139 mmol/L (ref 135–145)
Sodium: 139 mmol/L (ref 135–145)
TCO2: 30 mmol/L (ref 22–32)
TCO2: 30 mmol/L (ref 22–32)
pCO2, Ven: 47.5 mmHg (ref 44.0–60.0)
pCO2, Ven: 47.6 mmHg (ref 44.0–60.0)
pH, Ven: 7.385 (ref 7.250–7.430)
pH, Ven: 7.388 (ref 7.250–7.430)
pO2, Ven: 40 mmHg (ref 32.0–45.0)
pO2, Ven: 41 mmHg (ref 32.0–45.0)

## 2021-09-01 SURGERY — RIGHT/LEFT HEART CATH AND CORONARY ANGIOGRAPHY
Anesthesia: LOCAL

## 2021-09-01 SURGERY — ECHOCARDIOGRAM, TRANSESOPHAGEAL
Anesthesia: Monitor Anesthesia Care

## 2021-09-01 MED ORDER — RIVAROXABAN 20 MG PO TABS: 20.0000 mg | ORAL_TABLET | Freq: Every day | ORAL | 3 refills | Status: AC

## 2021-09-01 MED ORDER — MIDAZOLAM HCL 2 MG/2ML IJ SOLN
INTRAMUSCULAR | Status: DC | PRN
  Administered 2021-09-01: 1 mg via INTRAVENOUS
  Administered 2021-09-01: 2 mg via INTRAVENOUS

## 2021-09-01 MED ORDER — PROPOFOL 500 MG/50ML IV EMUL
INTRAVENOUS | Status: DC | PRN
  Administered 2021-09-01: 75 ug/kg/min via INTRAVENOUS

## 2021-09-01 MED ORDER — HEPARIN (PORCINE) IN NACL 1000-0.9 UT/500ML-% IV SOLN
INTRAVENOUS | Status: DC | PRN
  Administered 2021-09-01 (×3): 500 mL

## 2021-09-01 MED ORDER — FENTANYL CITRATE (PF) 100 MCG/2ML IJ SOLN
INTRAMUSCULAR | Status: AC
  Filled 2021-09-01: qty 2

## 2021-09-01 MED ORDER — PROPOFOL 10 MG/ML IV BOLUS
INTRAVENOUS | Status: DC | PRN
  Administered 2021-09-01: 20 mg via INTRAVENOUS
  Administered 2021-09-01 (×2): 30 mg via INTRAVENOUS
  Administered 2021-09-01: 20 mg via INTRAVENOUS

## 2021-09-01 MED ORDER — SODIUM CHLORIDE 0.9% FLUSH: 3.0000 mL | INTRAVENOUS | Status: DC | PRN

## 2021-09-01 MED ORDER — SODIUM CHLORIDE 0.9 % IV SOLN: INTRAVENOUS | Status: DC

## 2021-09-01 MED ORDER — LIDOCAINE HCL (PF) 1 % IJ SOLN
INTRAMUSCULAR | Status: DC | PRN
  Administered 2021-09-01 (×2): 2 mL via INTRADERMAL

## 2021-09-01 MED ORDER — ASPIRIN 81 MG PO CHEW
CHEWABLE_TABLET | ORAL | Status: AC
  Filled 2021-09-01: qty 1

## 2021-09-01 MED ORDER — HYDRALAZINE HCL 20 MG/ML IJ SOLN
10.0000 mg | INTRAMUSCULAR | Status: DC | PRN
  Administered 2021-09-01: 10 mg via INTRAVENOUS
  Filled 2021-09-01: qty 1

## 2021-09-01 MED ORDER — SODIUM CHLORIDE 0.9 % WEIGHT BASED INFUSION: 1.0000 mL/kg/h | INTRAVENOUS | Status: DC

## 2021-09-01 MED ORDER — SODIUM CHLORIDE 0.9 % WEIGHT BASED INFUSION: 3.0000 mL/kg/h | INTRAVENOUS | Status: AC

## 2021-09-01 MED ORDER — IOHEXOL 350 MG/ML SOLN
INTRAVENOUS | Status: DC | PRN
  Administered 2021-09-01: 95 mL via INTRA_ARTERIAL

## 2021-09-01 MED ORDER — VERAPAMIL HCL 2.5 MG/ML IV SOLN
INTRAVENOUS | Status: AC
  Filled 2021-09-01: qty 2

## 2021-09-01 MED ORDER — SODIUM CHLORIDE 0.9 % IV SOLN: INTRAVENOUS | Status: AC

## 2021-09-01 MED ORDER — LIDOCAINE 2% (20 MG/ML) 5 ML SYRINGE
INTRAMUSCULAR | Status: DC | PRN
Start: 2021-09-01 — End: 2021-09-01
  Administered 2021-09-01: 100 mg via INTRAVENOUS

## 2021-09-01 MED ORDER — EPHEDRINE SULFATE-NACL 50-0.9 MG/10ML-% IV SOSY
PREFILLED_SYRINGE | INTRAVENOUS | Status: DC | PRN
  Administered 2021-09-01: 10 mg via INTRAVENOUS
  Administered 2021-09-01 (×2): 5 mg via INTRAVENOUS

## 2021-09-01 MED ORDER — ASPIRIN 81 MG PO CHEW
81.0000 mg | CHEWABLE_TABLET | ORAL | Status: AC
  Administered 2021-09-01: 81 mg via ORAL

## 2021-09-01 MED ORDER — ACETAMINOPHEN 325 MG PO TABS: 650.0000 mg | ORAL_TABLET | ORAL | Status: DC | PRN

## 2021-09-01 MED ORDER — HEPARIN SODIUM (PORCINE) 1000 UNIT/ML IJ SOLN
INTRAMUSCULAR | Status: AC
  Filled 2021-09-01: qty 10

## 2021-09-01 MED ORDER — LIDOCAINE HCL (PF) 1 % IJ SOLN
INTRAMUSCULAR | Status: AC
  Filled 2021-09-01: qty 30

## 2021-09-01 MED ORDER — LABETALOL HCL 5 MG/ML IV SOLN: 10.0000 mg | INTRAVENOUS | Status: DC | PRN

## 2021-09-01 MED ORDER — VERAPAMIL HCL 2.5 MG/ML IV SOLN
INTRAVENOUS | Status: DC | PRN
  Administered 2021-09-01: 10 mL via INTRA_ARTERIAL

## 2021-09-01 MED ORDER — FENTANYL CITRATE (PF) 100 MCG/2ML IJ SOLN
INTRAMUSCULAR | Status: DC | PRN
  Administered 2021-09-01 (×2): 25 ug via INTRAVENOUS

## 2021-09-01 MED ORDER — SODIUM CHLORIDE 0.9 % IV SOLN: 250.0000 mL | INTRAVENOUS | Status: DC | PRN

## 2021-09-01 MED ORDER — ONDANSETRON HCL 4 MG/2ML IJ SOLN: 4.0000 mg | Freq: Four times a day (QID) | INTRAMUSCULAR | Status: DC | PRN

## 2021-09-01 MED ORDER — HEPARIN (PORCINE) IN NACL 1000-0.9 UT/500ML-% IV SOLN
INTRAVENOUS | Status: AC
  Filled 2021-09-01: qty 1000

## 2021-09-01 MED ORDER — MIDAZOLAM HCL 2 MG/2ML IJ SOLN
INTRAMUSCULAR | Status: AC
  Filled 2021-09-01: qty 2

## 2021-09-01 MED ORDER — HEPARIN SODIUM (PORCINE) 1000 UNIT/ML IJ SOLN
INTRAMUSCULAR | Status: DC | PRN
  Administered 2021-09-01: 4500 [IU] via INTRAVENOUS

## 2021-09-01 MED ORDER — SODIUM CHLORIDE 0.9% FLUSH: 3.0000 mL | Freq: Two times a day (BID) | INTRAVENOUS | Status: DC

## 2021-09-01 SURGICAL SUPPLY — 14 items
CATH 5FR JL3.5 JR4 ANG PIG MP (CATHETERS) ×1 IMPLANT
CATH BALLN WEDGE 5F 110CM (CATHETERS) ×1 IMPLANT
CATH INFINITI 5FR AL1 (CATHETERS) ×1 IMPLANT
GLIDESHEATH SLEND SS 6F .021 (SHEATH) ×1 IMPLANT
GUIDEWIRE .025 260CM (WIRE) ×1 IMPLANT
GUIDEWIRE INQWIRE 1.5J.035X260 (WIRE) IMPLANT
INQWIRE 1.5J .035X260CM (WIRE) ×2
KIT HEART LEFT (KITS) ×2 IMPLANT
PACK CARDIAC CATHETERIZATION (CUSTOM PROCEDURE TRAY) ×2 IMPLANT
SHEATH 6FR 85 DEST SLENDER (SHEATH) ×1 IMPLANT
SHEATH GLIDE SLENDER 4/5FR (SHEATH) ×1 IMPLANT
SYR MEDRAD MARK 7 150ML (SYRINGE) ×2 IMPLANT
TRANSDUCER W/STOPCOCK (MISCELLANEOUS) ×2 IMPLANT
TUBING CIL FLEX 10 FLL-RA (TUBING) ×2 IMPLANT

## 2021-09-01 NOTE — Progress Notes (Signed)
Patient transported to cath lab at 1350. Handoff to cath lab R\N

## 2021-09-01 NOTE — Transfer of Care (Signed)
Immediate Anesthesia Transfer of Care Note  Patient: Adrian Franco  Procedure(s) Performed: TRANSESOPHAGEAL ECHOCARDIOGRAM (TEE)  Patient Location: PACU and Endoscopy Unit  Anesthesia Type:MAC  Level of Consciousness: awake, alert  and oriented  Airway & Oxygen Therapy: Patient Spontanous Breathing and Patient connected to nasal cannula oxygen  Post-op Assessment: Report given to RN and Post -op Vital signs reviewed and stable  Post vital signs: Reviewed and stable  Last Vitals:  Vitals Value Taken Time  BP 112/54 09/01/21 1310  Temp    Pulse 72 09/01/21 1310  Resp 20 09/01/21 1310  SpO2 94 % 09/01/21 1310  Vitals shown include unvalidated device data.  Last Pain:  Vitals:   09/01/21 1153  TempSrc: Temporal  PainSc: 0-No pain         Complications: No notable events documented.

## 2021-09-01 NOTE — Discharge Instructions (Signed)

## 2021-09-01 NOTE — Interval H&P Note (Signed)
Cath Lab Visit (complete for each Cath Lab visit)  Clinical Evaluation Leading to the Procedure:   ACS: No.  Non-ACS:    Anginal Classification: CCS III  Anti-ischemic medical therapy: Minimal Therapy (1 class of medications)  Non-Invasive Test Results: No non-invasive testing performed  Prior CABG: No previous CABG   Plan for diagnostic in the setting of severe AI.  Plan for aortogram.   History and Physical Interval Note:  09/01/2021 1:52 PM  Adrian Franco  has presented today for surgery, with the diagnosis of Aortic Insufficiency; Aortic Aneuysym.  The various methods of treatment have been discussed with the patient and family. After consideration of risks, benefits and other options for treatment, the patient has consented to  Procedure(s): RIGHT/LEFT HEART CATH AND CORONARY ANGIOGRAPHY (N/A) as a surgical intervention.  The patient's history has been reviewed, patient examined, no change in status, stable for surgery.  I have reviewed the patient's chart and labs.  Questions were answered to the patient's satisfaction.     Larae Grooms

## 2021-09-01 NOTE — Interval H&P Note (Signed)
History and Physical Interval Note:  09/01/2021 11:52 AM  Adrian Franco  has presented today for surgery, with the diagnosis of aortic insufficiency.  The various methods of treatment have been discussed with the patient and family. After consideration of risks, benefits and other options for treatment, the patient has consented to  Procedure(s): TRANSESOPHAGEAL ECHOCARDIOGRAM (TEE) (N/A) as a surgical intervention.  The patient's history has been reviewed, patient examined, no change in status, stable for surgery.  I have reviewed the patient's chart and labs.  Questions were answered to the patient's satisfaction.     Jenkins Rouge

## 2021-09-01 NOTE — Anesthesia Postprocedure Evaluation (Signed)
Anesthesia Post Note  Patient: Adrian Franco  Procedure(s) Performed: TRANSESOPHAGEAL ECHOCARDIOGRAM (TEE)     Patient location during evaluation: Endoscopy Anesthesia Type: MAC Level of consciousness: awake and alert Pain management: pain level controlled Vital Signs Assessment: post-procedure vital signs reviewed and stable Respiratory status: spontaneous breathing, nonlabored ventilation and respiratory function stable Cardiovascular status: blood pressure returned to baseline and stable Postop Assessment: no apparent nausea or vomiting Anesthetic complications: no   No notable events documented.  Last Vitals:  Vitals:   09/01/21 1340 09/01/21 1414  BP: (!) 157/69   Pulse: 64   Resp: 16   Temp:    SpO2: 100% 98%    Last Pain:  Vitals:   09/01/21 1414  TempSrc:   PainSc: 0-No pain                 Lidia Collum

## 2021-09-01 NOTE — CV Procedure (Signed)
TEE: Anesthesia: Propofol  Bicuspid valve Sievers 1 partial fusion right/left cusps Severe eccentric AR with pre mature diastolic closure of anterior leaflet of MV Dilated sinus/aortic root 4.2-4.3 cm Mild MR Normal LV EF 60% No PFO No LAA thrombus Mild TR Normal RV No effusion   Jenkins Rouge MD [facility]

## 2021-09-01 NOTE — Anesthesia Preprocedure Evaluation (Addendum)
Anesthesia Evaluation  Patient identified by MRN, date of birth, ID band Patient awake    Reviewed: Allergy & Precautions, NPO status , Patient's Chart, lab work & pertinent test results  History of Anesthesia Complications Negative for: history of anesthetic complications  Airway Mallampati: II  TM Distance: >3 FB Neck ROM: Full    Dental   Pulmonary sleep apnea , PE   Pulmonary exam normal        Cardiovascular hypertension, + DVT  Normal cardiovascular exam+ Valvular Problems/Murmurs AI    Echo 08/08/21: 1. Left ventricular ejection fraction, by estimation, is 60 to 65%. The left ventricle has normal function. The left ventricle has no regional wall motion abnormalities. There is mild left ventricular hypertrophy. Left ventricular diastolic parameters  are consistent with Grade I diastolic dysfunction (impaired relaxation). The average left ventricular global longitudinal strain is -22.3 %. The global longitudinal strain is normal. 2. Right ventricular systolic function is normal. The right ventricular size is normal. Tricuspid regurgitation signal is inadequate for assessing PA pressure. 3. The mitral valve is abnormal. Trivial mitral valve regurgitation. 4. AI is posteriorly directed and restricts the anterior mitral leaflet. The aortic valve is tricuspid. Aortic valve regurgitation is moderate to severe. Aortic valve sclerosis/calcification is present, without any evidence of aortic stenosis. Aortic  regurgitation PHT measures 420 msec. 5. Aortic dilatation noted. There is moderate dilatation of the aortic root, measuring 47 mm. There is mild dilatation of the ascending aorta, measuring 44 mm. 6. The inferior vena cava is dilated in size with >50% respiratory variability, suggesting right atrial pressure of 8 mmHg.   Neuro/Psych Anxiety Depression negative neurological ROS     GI/Hepatic Neg liver ROS, GERD  ,   Endo/Other  negative endocrine ROS  Renal/GU negative Renal ROS  negative genitourinary   Musculoskeletal negative musculoskeletal ROS (+)   Abdominal   Peds  Hematology negative hematology ROS (+)   Anesthesia Other Findings   Reproductive/Obstetrics                           Anesthesia Physical Anesthesia Plan  ASA: 3  Anesthesia Plan: MAC   Post-op Pain Management: Minimal or no pain anticipated   Induction: Intravenous  PONV Risk Score and Plan: 1 and Propofol infusion, TIVA and Treatment may vary due to age or medical condition  Airway Management Planned: Natural Airway, Nasal Cannula and Simple Face Mask  Additional Equipment: None  Intra-op Plan:   Post-operative Plan:   Informed Consent: I have reviewed the patients History and Physical, chart, labs and discussed the procedure including the risks, benefits and alternatives for the proposed anesthesia with the patient or authorized representative who has indicated his/her understanding and acceptance.       Plan Discussed with:   Anesthesia Plan Comments:         Anesthesia Quick Evaluation

## 2021-09-01 NOTE — Progress Notes (Signed)
°  Echocardiogram Echocardiogram Transesophageal has been performed.  Johny Chess 09/01/2021, 1:12 PM

## 2021-09-01 NOTE — Anesthesia Procedure Notes (Signed)
Procedure Name: MAC Date/Time: 09/01/2021 12:45 PM Performed by: Trinna Post., CRNA Pre-anesthesia Checklist: Patient identified, Emergency Drugs available, Suction available, Patient being monitored and Timeout performed Patient Re-evaluated:Patient Re-evaluated prior to induction Oxygen Delivery Method: Nasal cannula Preoxygenation: Pre-oxygenation with 100% oxygen Induction Type: IV induction Placement Confirmation: positive ETCO2

## 2021-09-04 ENCOUNTER — Encounter (HOSPITAL_COMMUNITY): Payer: Self-pay | Admitting: Interventional Cardiology

## 2021-09-11 ENCOUNTER — Encounter: Payer: Medicare Other | Admitting: Thoracic Surgery (Cardiothoracic Vascular Surgery)

## 2021-09-13 ENCOUNTER — Institutional Professional Consult (permissible substitution) (INDEPENDENT_AMBULATORY_CARE_PROVIDER_SITE_OTHER): Payer: Medicare Other | Admitting: Thoracic Surgery (Cardiothoracic Vascular Surgery)

## 2021-09-13 ENCOUNTER — Other Ambulatory Visit: Payer: Self-pay

## 2021-09-13 ENCOUNTER — Other Ambulatory Visit: Payer: Self-pay | Admitting: *Deleted

## 2021-09-13 VITALS — BP 151/85 | HR 75 | Resp 20 | Ht 69.0 in | Wt 198.5 lb

## 2021-09-13 DIAGNOSIS — I351 Nonrheumatic aortic (valve) insufficiency: Secondary | ICD-10-CM

## 2021-09-13 DIAGNOSIS — Z01818 Encounter for other preprocedural examination: Secondary | ICD-10-CM

## 2021-09-13 DIAGNOSIS — I7121 Aneurysm of the ascending aorta, without rupture: Secondary | ICD-10-CM

## 2021-09-13 NOTE — Progress Notes (Signed)
PCP is [name], MD Referring Provider is Josue Hector, MD  Chief Complaint  Patient presents with   Aortic Insuffiency    Initial surgical consult, TEE 1/6, cath 1/6    HPI: [name] is sent for consideration for Bentall procedure.  Adrian Franco is a 76 year old gentleman with a past medical history significant for hypertension, hyperlipidemia, DVT, PE, aortic insufficiency, ascending aortic aneurysm, sleep apnea, arthritis, BPH, chronic back pain, and depression.  He has been followed for aortic insufficiency since 2017.  He says he is only been aware of it since 2019.  He does have a family history with his father having had aortic valve replacement and repair of an ascending aneurysm over 20 years ago.  Over the past year he has been having issues with decreased energy and worsening shortness of breath with exertion.  He is also been having some dizzy spells over the past couple of months particularly when he first stands up.  He does not have any orthopnea, paroxysmal nocturnal dyspnea, or peripheral edema outside of the issues related to his blood clot.  He has been having some vague chest discomfort over the past few days.  He describes this as a dull ache in the left side of his chest.  He had a left leg DVT with pulmonary emboli in 2017.  He has been on anticoagulation since then.  He still has some residual swelling in his left leg.  He uses Inspire device for sleep apnea. Past Medical History:  Diagnosis Date   Aneurysm ([place])    Anxiety    Aortic aneurysm (H[facility])    followed by Dr Johnsie Cancel   Arthritis    Atypical chest pain    BPH (benign prostatic hypertrophy)    Chronic back pain    Depression    DVT (deep venous thrombosis) (H[facility])    GERD (gastroesophageal reflux disease)    History of kidney stones    HTN (hypertension)    Hyperlipemia    Hypogonadism in male    Insomnia    Localized edema    PE (pulmonary embolism)    Renal insufficiency    Restless  leg syndrome    Sleep apnea    severe OSA-cannot tolerate CPAP   SOB (shortness of breath)    Testicular hypofunction     Past Surgical History:  Procedure Laterality Date   DRUG INDUCED ENDOSCOPY N/A 12/09/2019   Procedure: DRUG INDUCED ENDOSCOPY;  Surgeon: Melida Quitter, MD;  Location: [place];  Service: ENT;  Laterality: N/A;   IMPLANTATION OF HYPOGLOSSAL NERVE STIMULATOR Right 02/17/2020   Procedure: IMPLANTATION OF HYPOGLOSSAL NERVE STIMULATOR;  Surgeon: Melida Quitter, MD;  Location: [place];  Service: ENT;  Laterality: Right;   INGUINAL HERNIA REPAIR     KIDNEY STONE SURGERY     stent   REVERSE SHOULDER ARTHROPLASTY Left 03/08/2021   Procedure: REVERSE SHOULDER ARTHROPLASTY;  Surgeon: Hiram Gash, MD;  Location: [facility];  Service: Orthopedics;  Laterality: Left;   RIGHT/LEFT HEART CATH AND CORONARY ANGIOGRAPHY N/A 09/01/2021   Procedure: RIGHT/LEFT HEART CATH AND CORONARY ANGIOGRAPHY;  Surgeon: Jettie Booze, MD;  Location: [place] [facility];  Service: Cardiovascular;  Laterality: N/A;   ROTATOR CUFF REPAIR Right    TEE WITHOUT CARDIOVERSION N/A 09/01/2021   Procedure: TRANSESOPHAGEAL ECHOCARDIOGRAM (TEE);  Surgeon: Josue Hector, MD;  Location: [facility];  Service: Cardiovascular;  Laterality: N/A;    Family History  Problem Relation Age of Onset  Hyperlipidemia Mother    Hyperlipidemia Father    Heart attack Father    Healthy Son    Healthy Brother    Other Brother    Healthy Sister    Healthy Son     Social History Social History   Tobacco Use   Smoking status: Never   Smokeless tobacco: Never  Vaping Use   Vaping Use: Never used  Substance Use Topics   Alcohol use: No    Alcohol/week: 0.0 standard drinks   Drug use: No    Current Outpatient Medications  Medication Sig Dispense Refill   acetaminophen (TYLENOL) 500 MG tablet Take 1,000 mg by mouth every 8 (eight) hours as needed for moderate pain.     Ascorbic Acid (VITAMIN C) 1000  MG tablet Take 1,000 mg by mouth in the morning.     Cholecalciferol (VITAMIN D) 50 MCG (2000 UT) tablet Take 2,000 Units by mouth daily.     Collagen-Vitamin C (SUPER COLLAGEN PLUS VITAMIN C PO) Take 1 tablet by mouth daily.     Cyanocobalamin (B-12) 5000 MCG CAPS Take 5,000 mcg by mouth daily.     docusate sodium (COLACE) 100 MG capsule Take 100 mg by mouth daily as needed for moderate constipation.     irbesartan (AVAPRO) 75 MG tablet TAKE 1 TABLET BY MOUTH EVERY DAY *DOSE INCREASE* 90 tablet 2   LORazepam (ATIVAN) 0.5 MG tablet Take 0.5 mg by mouth at bedtime.     magnesium oxide (MAG-OX) 400 MG tablet Take 400 mg by mouth daily.     Multiple Vitamins-Minerals (ONE-A-DAY MENS 50+ ADVANTAGE) TABS Take 1 tablet by mouth daily with breakfast.     omeprazole (PRILOSEC) 40 MG capsule Take 40 mg by mouth daily.     oxymetazoline (AFRIN) 0.05 % nasal spray Place 1 spray into both nostrils at bedtime.     Polyvinyl Alcohol-Povidone (REFRESH OP) Place 1 drop into the left eye daily.     pregabalin (LYRICA) 50 MG capsule Take 50 mg by mouth 2 (two) times daily.     rivaroxaban (XARELTO) 20 MG TABS tablet Take 1 tablet (20 mg total) by mouth daily with supper. 90 tablet 3   rOPINIRole (REQUIP) 2 MG tablet Take 2 mg by mouth at bedtime.     simvastatin (ZOCOR) 40 MG tablet TAKE 1 TABLET EVERY DAY 90 tablet 3   tamsulosin (FLOMAX) 0.4 MG CAPS capsule Take 0.4 mg by mouth 2 (two) times daily.     Testosterone Cypionate 200 MG/ML SOLN Inject 200 mg into the muscle every 14 (fourteen) days.      tiZANidine (ZANAFLEX) 4 MG tablet Take 4 mg by mouth every 6 (six) hours as needed for muscle spasms.     Current Facility-Administered Medications  Medication Dose Route Frequency Provider Last Rate Last Admin   sodium chloride flush (NS) 0.9 % injection 3 mL  3 mL Intravenous Q12H Nahser, Wonda Cheng, MD        Allergies  Allergen Reactions   Sulfa Antibiotics Itching   Lotensin [Benazepril Hcl] Hives and Rash     Review of Systems  Constitutional:  Positive for activity change and fatigue. Negative for unexpected weight change.  HENT:  Negative for trouble swallowing and voice change.   Eyes:  Negative for visual disturbance.  Respiratory:  Positive for chest tightness and shortness of breath.   Cardiovascular:  Positive for leg swelling (left).  Gastrointestinal:  Positive for abdominal pain (Reflux).  Genitourinary:  Positive for frequency.  BPH  Musculoskeletal:  Positive for arthralgias and joint swelling.  Neurological:  Positive for dizziness, tremors and numbness (Feet). Negative for syncope, speech difficulty and weakness.  Hematological:  Negative for adenopathy. Bruises/bleeds easily.   BP (!) 151/85 (BP Location: Right Arm, Patient Position: Sitting)    Pulse 75    Resp 20    Ht 5\' 9"  (1.753 m)    Wt 198 lb 8 oz (90 kg)    SpO2 96% Comment: RA   BMI 29.31 kg/m  Physical Exam Vitals reviewed.  Constitutional:      General: He is not in acute distress.    Appearance: Normal appearance.  HENT:     Head: Normocephalic and atraumatic.  Eyes:     General: No scleral icterus.    Extraocular Movements: Extraocular movements intact.  Neck:     Vascular: No carotid bruit.  Cardiovascular:     Rate and Rhythm: Normal rate and regular rhythm.     Heart sounds: Murmur (Faint systolic and diastolic murmur right upper sternal border) heard.  Pulmonary:     Effort: Pulmonary effort is normal. No respiratory distress.     Breath sounds: Normal breath sounds. No wheezing or rales.  Abdominal:     General: There is no distension.     Palpations: Abdomen is soft.     Tenderness: There is no abdominal tenderness.  Lymphadenopathy:     Cervical: No cervical adenopathy.  Skin:    General: Skin is warm and dry.  Neurological:     General: No focal deficit present.     Mental Status: He is alert and oriented to person, place, and time.     Cranial Nerves: No cranial nerve deficit.      Motor: No weakness.    Diagnostic Tests: Transesophageal echocardiogram 09/01/2021 IMPRESSIONS     1. Left ventricular ejection fraction, by estimation, is 60 to 65%. The  left ventricle has normal function. The left ventricle has no regional  wall motion abnormalities.   2. Right ventricular systolic function is normal. The right ventricular  size is normal.   3. Left atrial size was mildly dilated. No left atrial/left atrial  appendage thrombus was detected.   4. Premature diastolic closure of the anterior leaflet due to severe AR .  The mitral valve is abnormal. Mild mitral valve regurgitation. No evidence  of mitral stenosis.   5. Functionally bicuspid with fused right and left cusp Dilated sinuses  4.3 cm and dilated aortic root 4.2 cm Unable to quantitate AR due to  horizontal angle of root in trans gastric imaging and eccentric jet in  long axis Sievers 1 with mild calcium in  basal raphe . The aortic valve is bicuspid. There is mild calcification of  the aortic valve. There is moderate thickening of the aortic valve. Aortic  valve regurgitation is severe. Aortic valve sclerosis/calcification is  present, without any evidence of  aortic stenosis.   6. The inferior vena cava is normal in size with greater than 50%  respiratory variability, suggesting right atrial pressure of 3 mmHg.   Conclusion(s)/Recommendation(s): Normal biventricular function without  evidence of hemodynamically significant valvular heart disease.   Cardiac catheterization 09/01/2021 Conclusion      Prox LAD to Mid LAD lesion is 25% stenosed with 25% stenosed side branch in 1st Diag.   Dist LM lesion is 10% stenosed.   Ost Cx to Prox Cx lesion is 25% stenosed.   1st Mrg lesion is 25% stenosed.  LV end diastolic pressure is mildly elevated.   There is no aortic valve stenosis.   Dilated ascending aorta with severe aortic regurgitation   Aortic saturation 92%, PA saturation 74%, PA pressure 38/11,  mean PA pressure 22 mmHg, mean pulmonary capillary wedge pressure 11 mmHg, cardiac output 7.5 L/min, cardiac index 3.7.   I personally reviewed the CT from November 2021, the transesophageal echocardiogram images and the cardiac catheterization images.  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  Preserved left ventricular function.  No hemodynamically significant coronary disease.  No evidence of pulmonary hypertension.  Impression: Adrian Franco is a 76 year old gentleman with a past medical history significant for hypertension, hyperlipidemia, DVT, PE, aortic insufficiency, ascending aortic aneurysm, sleep apnea, arthritis, BPH, chronic back pain, and depression.   He presents with progressive dyspnea.  He is also having some dizziness particularly when he first stands up.  The symptoms are most likely related to his severe aortic insufficiency.  He had a CT in November 2021 which showed roughly a 4.3 cm ascending aneurysm.  He needs another CT prior to surgery so we can determine whether anything needs to be done with that time of surgery and make sure there is a need to extend up into the arch.  He does have a history of pulmonary emboli.  That was back in 2017.  He has not had any recurrence that he is aware of.  He has stayed on anticoagulation since then.  He had no evidence of pulmonary hypertension on his right heart catheterization, so I do not think that is playing a role in his dyspnea.  We discussed treatment of the aortic insufficiency.  Although repair is possible under some circumstances, in most cases valve replacement is the treatment of choice.  We discussed tissue versus mechanical valves.  Given his age a tissue valve would be the appropriate selection even though he is on anticoagulation for PE.  I described the proposed operative procedure to Mr. and Mrs. Isakson to the best my ability pending the results of the CT chest.  I informed him of  the general nature of the procedure including the need for general anesthesia, the incisions to be used, the use of cardiopulmonary bypass, and the use of drainage tubes and pacemaker wires postoperatively.  Discussed expected hospital stay and overall recovery.  I informed him of the indications, risk, benefits, and alternatives.  They understand the risks include, but not limited to death, MI, DVT, PE, bleeding, possible need for transfusion, infection, cardiac arrhythmias, heart block requiring pacemaker placement, respiratory or renal failure, as well as possibility of other unforeseeable complications.  He does have an Inspire device for sleep apnea.  I will have to check with Dr. Redmond Baseman to see if there is any issues with that around the time of surgery.   Plan: CT angiogram of the chest to plan extent of surgery on aorta.  Return in 1 week to discuss results of CT angiogram Tentatively plan for aortic valve, probable Bentall on Wednesday, 10/04/2020  He will need to hold Xarelto for 48 hours prior to surgery  Melrose Nakayama, MD [facility] [phone]

## 2021-09-14 ENCOUNTER — Other Ambulatory Visit: Payer: Self-pay | Admitting: *Deleted

## 2021-09-14 ENCOUNTER — Encounter: Payer: Self-pay | Admitting: *Deleted

## 2021-09-14 DIAGNOSIS — I7121 Aneurysm of the ascending aorta, without rupture: Secondary | ICD-10-CM

## 2021-09-14 DIAGNOSIS — Z5181 Encounter for therapeutic drug level monitoring: Secondary | ICD-10-CM

## 2021-09-14 DIAGNOSIS — I351 Nonrheumatic aortic (valve) insufficiency: Secondary | ICD-10-CM

## 2021-09-14 DIAGNOSIS — R7309 Other abnormal glucose: Secondary | ICD-10-CM

## 2021-09-19 ENCOUNTER — Other Ambulatory Visit: Payer: Self-pay

## 2021-09-19 ENCOUNTER — Other Ambulatory Visit: Payer: Self-pay | Admitting: *Deleted

## 2021-09-19 ENCOUNTER — Ambulatory Visit (INDEPENDENT_AMBULATORY_CARE_PROVIDER_SITE_OTHER): Payer: Medicare Other | Admitting: Thoracic Surgery (Cardiothoracic Vascular Surgery)

## 2021-09-19 ENCOUNTER — Ambulatory Visit
Admission: RE | Admit: 2021-09-19 | Discharge: 2021-09-19 | Disposition: A | Discharge status: Home / Self Care | Payer: Medicare Other | Source: Ambulatory Visit | Attending: Thoracic Surgery (Cardiothoracic Vascular Surgery) | Admitting: Thoracic Surgery (Cardiothoracic Vascular Surgery)

## 2021-09-19 ENCOUNTER — Encounter: Payer: Self-pay | Admitting: Thoracic Surgery (Cardiothoracic Vascular Surgery)

## 2021-09-19 VITALS — BP 148/82 | HR 95 | Resp 20 | Ht 69.0 in

## 2021-09-19 DIAGNOSIS — I7121 Aneurysm of the ascending aorta, without rupture: Secondary | ICD-10-CM

## 2021-09-19 DIAGNOSIS — I351 Nonrheumatic aortic (valve) insufficiency: Secondary | ICD-10-CM

## 2021-09-19 DIAGNOSIS — Z01818 Encounter for other preprocedural examination: Secondary | ICD-10-CM

## 2021-09-19 MED ORDER — IOPAMIDOL (ISOVUE-370) INJECTION 76%
75.0000 mL | Freq: Once | INTRAVENOUS | Status: AC | PRN
  Administered 2021-09-19: 75 mL via INTRAVENOUS

## 2021-09-19 NOTE — Progress Notes (Signed)
° °   °[address] °      [place] [address] °            [phone]   ° °  ° °HPI: Adrian Franco returns to discuss the results of his CT angiogram of the chest. ° °Adrian Franco is a 76-year-old man with a history of hypertension, hyperlipidemia, DVT, PE, aortic insufficiency, ascending aneurysm, sleep apnea, arthritis, BPH, chronic back pain, and depression.  Adrian Franco has been followed for a high since 2017. ° °Over the past year Adrian Franco is complains of decreased energy, dyspnea with exertion, and dizzy spells.  Dizzy spells have increased in frequency and severity over the past few weeks. ° ° °Current Outpatient Medications  °Medication Sig Dispense Refill  ° acetaminophen (TYLENOL) 500 MG tablet Take 1,000 mg by mouth every 8 (eight) hours as needed for moderate pain.    ° Ascorbic Acid (VITAMIN C) 1000 MG tablet Take 1,000 mg by mouth in the morning.    ° Cholecalciferol (VITAMIN D) 50 MCG (2000 UT) tablet Take 2,000 Units by mouth daily.    ° Collagen-Vitamin C (SUPER COLLAGEN PLUS VITAMIN C PO) Take 1 tablet by mouth daily.    ° Cyanocobalamin (B-12) 5000 MCG CAPS Take 5,000 mcg by mouth daily.    ° docusate sodium (COLACE) 100 MG capsule Take 100 mg by mouth daily as needed for moderate constipation.    ° irbesartan (AVAPRO) 75 MG tablet TAKE 1 TABLET BY MOUTH EVERY DAY *DOSE INCREASE* 90 tablet 2  ° LORazepam (ATIVAN) 0.5 MG tablet Take 0.5 mg by mouth at bedtime.    ° magnesium oxide (MAG-OX) 400 MG tablet Take 400 mg by mouth daily.    ° Multiple Vitamins-Minerals (ONE-A-DAY MENS 50+ ADVANTAGE) TABS Take 1 tablet by mouth daily with breakfast.    ° omeprazole (PRILOSEC) 40 MG capsule Take 40 mg by mouth daily.    ° oxymetazoline (AFRIN) 0.05 % nasal spray Place 1 spray into both nostrils at bedtime.    ° Polyvinyl Alcohol-Povidone (REFRESH OP) Place 1 drop into the left eye daily.    ° pregabalin (LYRICA) 50 MG capsule Take 50 mg by mouth 2 (two) times daily.    ° rivaroxaban (XARELTO) 20 MG  TABS tablet Take 1 tablet (20 mg total) by mouth daily with supper. 90 tablet 3  ° rOPINIRole (REQUIP) 2 MG tablet Take 2 mg by mouth at bedtime.    ° simvastatin (ZOCOR) 40 MG tablet TAKE 1 TABLET EVERY DAY 90 tablet 3  ° tamsulosin (FLOMAX) 0.4 MG CAPS capsule Take 0.4 mg by mouth 2 (two) times daily.    ° Testosterone Cypionate 200 MG/ML SOLN Inject 200 mg into the muscle every 14 (fourteen) days.     ° tiZANidine (ZANAFLEX) 4 MG tablet Take 4 mg by mouth every 6 (six) hours as needed for muscle spasms.    ° °Current Facility-Administered Medications  °Medication Dose Route Frequency Provider Last Rate Last Admin  ° sodium chloride flush (NS) 0.9 % injection 3 mL  3 mL Intravenous Q12H Nahser, Philip J, MD      ° ° °Physical Exam °BP (!) 148/82 (BP Location: Right Arm, Patient Position: Sitting)    Pulse 95    Resp 20    Ht 5' 9" (1.753 m)    SpO2 95% Comment: RA   BMI 29.31 kg/m²  °76-year-old man in no acute distress °Alert and oriented x3 with no focal deficits °No carotid bruits °Cardiac   faint systolic diastolic murmurs °Lungs clear bilaterally °No peripheral edema ° °Diagnostic Tests: °CT ANGIOGRAPHY CHEST WITH CONTRAST °  °TECHNIQUE: °Multidetector CT imaging of the chest was performed using the °standard protocol during bolus administration of intravenous °contrast. Multiplanar CT image reconstructions and MIPs were °obtained to evaluate the vascular anatomy. °  °RADIATION DOSE REDUCTION: This exam was performed according to the °departmental dose-optimization program which includes automated °exposure control, adjustment of the mA and/or kV according to °patient size and/or use of iterative reconstruction technique. °  °CONTRAST:  75mL ISOVUE-370 IOPAMIDOL (ISOVUE-370) INJECTION 76% °  °COMPARISON:  07/01/2020 °  °FINDINGS: °Cardiovascular: Again noted is a fusiform aneurysm of the ascending °thoracic aorta. Ascending thoracic aorta measures up to 4.3 cm and °previously measured 4.2 cm. Atherosclerotic  calcifications at the °aortic root. Atherosclerotic calcifications involving the proximal °LAD and left circumflex coronary artery. Heart size is normal. °Proximal descending thoracic aorta measures 3.6 cm and stable. °Distal descending thoracic aorta measures 2.9 cm and stable. Main °pulmonary arteries are patent. Great vessels are patent with typical °three-vessel arch anatomy. Celiac trunk and main branches are °patent. Proximal SMA is patent. °  °Mediastinum/Nodes: No mediastinal or hilar lymph node enlargement. °No axillary lymph node enlargement. °  °Lungs/Pleura: Trachea and mainstem bronchi are patent. No pleural °effusions. Both lungs are clear. °  °Upper Abdomen: Again noted is a cystic structure that appears to be °involving the left kidney upper pole that measures roughly 3.1 cm. °No acute abnormality in the upper abdomen. °  °Musculoskeletal: Left shoulder replacement. Evidence for a °neurostimulator device overlying the right anterior chest with a °lead along the right anterior pleura. No acute bone abnormality. °  °Review of the MIP images confirms the above findings. °  °IMPRESSION: °1. 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 Recommend annual °imaging followup by CTA or MRA. This recommendation follows 2010 °ACCF/AHA/AATS/ACR/ASA/SCA/SCAI/SIR/STS/SVM Guidelines for the °Diagnosis and Management of Patients with Thoracic Aortic Disease. °Circulation. 2010; 121: E266-e369. Aortic aneurysm NOS (ICD10-I71.9) °2. No acute chest abnormality. °3. Aortic Atherosclerosis (ICD10-I70.0). Coronary artery °calcifications. °  °  °Electronically Signed °  By: Adam  Henn M.D. °  On: 09/19/2021 12:00 °I personally reviewed the CT images.  There is a 4.2- 4.3 cm ascending aneurysm.  Measures approximately 4.5 cm in aortic root. ° °Impression: °Adrian Franco is a 76-year-old male with a history of hypertension, hyperlipidemia, DVT, PE, aortic  insufficiency, ascending aneurysm, sleep apnea, arthritis, BPH, back pain, and depression.  Adrian Franco presents with progressive dyspnea and also is now having some presyncopal episodes.  Echocardiogram showed severe aortic insufficiency with preserved left ventricular systolic function.  There also was a dilated root. ° °I met with him last week and recommended aortic valve replacement.  I wanted to get a CT angio to better assess the aortic root and ascending aorta.  It shows a 4.5 cm diameter at the sinuses of Valsalva approximately 4.2 to 4.3 cm in the mid ascending aorta.  It does taper to normal prior to the takeoff of the innominate artery and there is no arch aneurysm. ° °I think the best option for Adrian Franco would be to do a biologic Bentall.  Adrian Franco is on anticoagulation because of his history of DVT and PE.  We will plan to put him back on anticoagulation postoperatively.  With a biologic valve, if Adrian Franco were to have any bleeding complications we could safely stop his anticoagulation. ° °  We had an in-depth discussion regarding the indications, risks, benefits, and alternatives at his last visit a week ago.  Adrian Franco is aware of the risks and accepts those and agrees to proceed.  Adrian Franco does not have any further questions at this time. ° °Inspire device for sleep apnea-I did discuss with Dr. Bates and Adrian Franco checked with the company rep.  Only needs to be turned off during procedure. ° °Plan: °Biologic Bentall procedure on October 04, 2021 ° °Mayu Ronk C Mckinna Demars, MD °[facility] °[phone] ° ° ° °

## 2021-09-19 NOTE — H&P (View-Only) (Signed)
[address]       [place] [address]             [phone]       HPI: [name] returns to discuss the results of his CT angiogram of the chest.  Adrian Franco is a 76 year old man with a history of hypertension, hyperlipidemia, DVT, PE, aortic insufficiency, ascending aneurysm, sleep apnea, arthritis, BPH, chronic back pain, and depression.  He has been followed for a high since 2017.  Over the past year he is complains of decreased energy, dyspnea with exertion, and dizzy spells.  Dizzy spells have increased in frequency and severity over the past few weeks.   Current Outpatient Medications  Medication Sig Dispense Refill   acetaminophen (TYLENOL) 500 MG tablet Take 1,000 mg by mouth every 8 (eight) hours as needed for moderate pain.     Ascorbic Acid (VITAMIN C) 1000 MG tablet Take 1,000 mg by mouth in the morning.     Cholecalciferol (VITAMIN D) 50 MCG (2000 UT) tablet Take 2,000 Units by mouth daily.     Collagen-Vitamin C (SUPER COLLAGEN PLUS VITAMIN C PO) Take 1 tablet by mouth daily.     Cyanocobalamin (B-12) 5000 MCG CAPS Take 5,000 mcg by mouth daily.     docusate sodium (COLACE) 100 MG capsule Take 100 mg by mouth daily as needed for moderate constipation.     irbesartan (AVAPRO) 75 MG tablet TAKE 1 TABLET BY MOUTH EVERY DAY *DOSE INCREASE* 90 tablet 2   LORazepam (ATIVAN) 0.5 MG tablet Take 0.5 mg by mouth at bedtime.     magnesium oxide (MAG-OX) 400 MG tablet Take 400 mg by mouth daily.     Multiple Vitamins-Minerals (ONE-A-DAY MENS 50+ ADVANTAGE) TABS Take 1 tablet by mouth daily with breakfast.     omeprazole (PRILOSEC) 40 MG capsule Take 40 mg by mouth daily.     oxymetazoline (AFRIN) 0.05 % nasal spray Place 1 spray into both nostrils at bedtime.     Polyvinyl Alcohol-Povidone (REFRESH OP) Place 1 drop into the left eye daily.     pregabalin (LYRICA) 50 MG capsule Take 50 mg by mouth 2 (two) times daily.     rivaroxaban (XARELTO) 20 MG  TABS tablet Take 1 tablet (20 mg total) by mouth daily with supper. 90 tablet 3   rOPINIRole (REQUIP) 2 MG tablet Take 2 mg by mouth at bedtime.     simvastatin (ZOCOR) 40 MG tablet TAKE 1 TABLET EVERY DAY 90 tablet 3   tamsulosin (FLOMAX) 0.4 MG CAPS capsule Take 0.4 mg by mouth 2 (two) times daily.     Testosterone Cypionate 200 MG/ML SOLN Inject 200 mg into the muscle every 14 (fourteen) days.      tiZANidine (ZANAFLEX) 4 MG tablet Take 4 mg by mouth every 6 (six) hours as needed for muscle spasms.     Current Facility-Administered Medications  Medication Dose Route Frequency Provider Last Rate Last Admin   sodium chloride flush (NS) 0.9 % injection 3 mL  3 mL Intravenous Q12H Nahser, Wonda Cheng, MD        Physical Exam BP (!) 148/82 (BP Location: Right Arm, Patient Position: Sitting)    Pulse 95    Resp 20    Ht 5' 9" (1.753 m)    SpO2 95% Comment: RA   BMI 29.57 kg/m  76 year old man in no acute distress Alert and oriented x3 with no focal deficits No carotid bruits  Cardiac faint systolic diastolic murmurs Lungs clear bilaterally No peripheral edema  Diagnostic Tests: CT ANGIOGRAPHY CHEST WITH CONTRAST   TECHNIQUE: Multidetector CT imaging of the chest was performed using the standard protocol during bolus administration of intravenous contrast. Multiplanar CT image reconstructions and MIPs were obtained to evaluate the vascular anatomy.   RADIATION DOSE REDUCTION: This exam was performed according to the departmental dose-optimization program which includes automated exposure control, adjustment of the mA and/or kV according to patient size and/or use of iterative reconstruction technique.   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 Ascending thoracic aorta measures up to 4.3 cm and previously measured 4.2 cm. Atherosclerotic  calcifications at the aortic root. Atherosclerotic calcifications involving the proximal LAD and left circumflex coronary artery. Heart size is normal. Proximal descending thoracic aorta measures 3.6 cm and stable. Distal descending thoracic aorta measures 2.9 cm and stable. Main pulmonary arteries are patent. Great vessels are patent with typical three-vessel arch anatomy. Celiac trunk and main branches are patent. Proximal SMA is patent.   Mediastinum/Nodes: No mediastinal or hilar lymph node enlargement. No axillary lymph node enlargement.   Lungs/Pleura: Trachea and mainstem bronchi are patent. No pleural effusions. Both lungs are clear.   Upper Abdomen: Again noted is a cystic structure that appears to be involving the left kidney upper pole that measures roughly 3.1 cm. No acute abnormality in the upper abdomen.   Musculoskeletal: Left shoulder replacement. Evidence for a neurostimulator device overlying the right anterior chest with a lead along the right anterior pleura. No acute bone abnormality.   Review of the MIP images confirms the above findings.   IMPRESSION: 1. 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 Recommend annual imaging followup by CTA or MRA. This recommendation follows 2010 ACCF/AHA/AATS/ACR/ASA/SCA/SCAI/SIR/STS/SVM Guidelines for the Diagnosis and Management of Patients with Thoracic Aortic Disease. Circulation. 2010; 121:: [id number] Aortic aneurysm NOS (ICD10-I71.9) 2. No acute chest abnormality. 3. Aortic Atherosclerosis (ICD10-I70.0). Coronary artery calcifications.     Electronically Signed   By: AMarkus DaftM.D.   On: 09/19/2021 12:00 I personally reviewed the CT images.  There is a 4.2- 4.3 cm ascending aneurysm.  Measures approximately 4.5 cm in aortic root.  Impression: Adrian Sallyis a 76year old male with a history of hypertension, hyperlipidemia, DVT, PE, aortic  insufficiency, ascending aneurysm, sleep apnea, arthritis, BPH, back pain, and depression.  He presents with progressive dyspnea and also is now having some presyncopal episodes.  Echocardiogram showed severe aortic insufficiency with preserved left ventricular systolic function.  There also was a dilated root.  I met with him last week and recommended aortic valve replacement.  I wanted to get a CT angio to better assess the aortic root and ascending aorta.  It shows a 4.5 cm diameter at the sinuses of Valsalva approximately 4.2 to 4.3 cm in the mid ascending aorta.  It does taper to normal prior to the takeoff of the innominate artery and there is no arch aneurysm.  I think the best option for Mr. WWorldswould be to do a biologic Bentall.  He is on anticoagulation because of his history of DVT and PE.  We will plan to put him back on anticoagulation postoperatively.  With a biologic valve, if he were to have any bleeding complications we could safely stop his anticoagulation.  We had an in-depth discussion regarding the indications, risks, benefits, and alternatives at his last visit a week ago.  He is aware of the risks and accepts those and agrees to proceed.  He does not have any further questions at this time.  Inspire device for sleep apnea-I did discuss with Dr. Redmond Baseman and he checked with the company rep.  Only needs to be turned off during procedure.  Plan: Biologic Bentall procedure on October 04, 2021  Melrose Nakayama, MD [facility] [phone]

## 2021-10-02 ENCOUNTER — Ambulatory Visit (HOSPITAL_COMMUNITY)
Admission: RE | Admit: 2021-10-02 | Discharge: 2021-10-02 | Disposition: A | Discharge status: Home / Self Care | Payer: Medicare Other | Source: Ambulatory Visit | Attending: Thoracic Surgery (Cardiothoracic Vascular Surgery) | Admitting: Thoracic Surgery (Cardiothoracic Vascular Surgery)

## 2021-10-02 ENCOUNTER — Encounter (HOSPITAL_COMMUNITY): Payer: Self-pay

## 2021-10-02 ENCOUNTER — Ambulatory Visit (HOSPITAL_BASED_OUTPATIENT_CLINIC_OR_DEPARTMENT_OTHER)
Admission: RE | Admit: 2021-10-02 | Discharge: 2021-10-02 | Disposition: A | Discharge status: Home / Self Care | Payer: Medicare Other | Source: Ambulatory Visit | Attending: Thoracic Surgery (Cardiothoracic Vascular Surgery) | Admitting: Thoracic Surgery (Cardiothoracic Vascular Surgery)

## 2021-10-02 ENCOUNTER — Encounter (HOSPITAL_COMMUNITY)
Admission: RE | Admit: 2021-10-02 | Discharge: 2021-10-02 | Disposition: A | Discharge status: Home / Self Care | Payer: Medicare Other | Source: Ambulatory Visit | Attending: Thoracic Surgery (Cardiothoracic Vascular Surgery) | Admitting: Thoracic Surgery (Cardiothoracic Vascular Surgery)

## 2021-10-02 ENCOUNTER — Other Ambulatory Visit: Payer: Self-pay

## 2021-10-02 DIAGNOSIS — E785 Hyperlipidemia, unspecified: Secondary | ICD-10-CM | POA: Insufficient documentation

## 2021-10-02 DIAGNOSIS — Z951 Presence of aortocoronary bypass graft: Secondary | ICD-10-CM | POA: Insufficient documentation

## 2021-10-02 DIAGNOSIS — I351 Nonrheumatic aortic (valve) insufficiency: Secondary | ICD-10-CM | POA: Insufficient documentation

## 2021-10-02 DIAGNOSIS — I10 Essential (primary) hypertension: Secondary | ICD-10-CM | POA: Insufficient documentation

## 2021-10-02 DIAGNOSIS — Z01818 Encounter for other preprocedural examination: Secondary | ICD-10-CM | POA: Insufficient documentation

## 2021-10-02 DIAGNOSIS — Z5181 Encounter for therapeutic drug level monitoring: Secondary | ICD-10-CM

## 2021-10-02 DIAGNOSIS — Z20822 Contact with and (suspected) exposure to covid-19: Secondary | ICD-10-CM | POA: Insufficient documentation

## 2021-10-02 DIAGNOSIS — R7309 Other abnormal glucose: Secondary | ICD-10-CM | POA: Insufficient documentation

## 2021-10-02 DIAGNOSIS — I7121 Aneurysm of the ascending aorta, without rupture: Secondary | ICD-10-CM | POA: Insufficient documentation

## 2021-10-02 LAB — CBC
HCT: 44.4 % (ref 39.0–52.0)
Hemoglobin: 15 g/dL (ref 13.0–17.0)
MCH: 30.7 pg (ref 26.0–34.0)
MCHC: 33.8 g/dL (ref 30.0–36.0)
MCV: 91 fL (ref 80.0–100.0)
Platelets: 190 10*3/uL (ref 150–400)
RBC: 4.88 MIL/uL (ref 4.22–5.81)
RDW: 12.3 % (ref 11.5–15.5)
WBC: 5 10*3/uL (ref 4.0–10.5)
nRBC: 0 % (ref 0.0–0.2)

## 2021-10-02 LAB — COMPREHENSIVE METABOLIC PANEL
ALT: 21 U/L (ref 0–44)
AST: 25 U/L (ref 15–41)
Albumin: 3.9 g/dL (ref 3.5–5.0)
Alkaline Phosphatase: 22 U/L — ABNORMAL LOW (ref 38–126)
Anion gap: 10 (ref 5–15)
BUN: 17 mg/dL (ref 8–23)
CO2: 24 mmol/L (ref 22–32)
Calcium: 9.7 mg/dL (ref 8.9–10.3)
Chloride: 105 mmol/L (ref 98–111)
Creatinine, Ser: 1.19 mg/dL (ref 0.61–1.24)
GFR, Estimated: >60 mL/min (ref >60)
Glucose, Bld: 97 mg/dL (ref 70–99)
Potassium: 4.2 mmol/L (ref 3.5–5.1)
Sodium: 139 mmol/L (ref 135–145)
Total Bilirubin: 1 mg/dL (ref 0.3–1.2)
Total Protein: 6.6 g/dL (ref 6.5–8.1)

## 2021-10-02 LAB — BLOOD GAS, ARTERIAL
Acid-Base Excess: 1.9 mmol/L (ref 0.0–2.0)
Bicarbonate: 25.8 mmol/L (ref 20.0–28.0)
Drawn by: 58793
FIO2: 21
O2 Saturation: 98.3 %
Patient temperature: 37
pCO2 arterial: 39.6 mmHg (ref 32.0–48.0)
pH, Arterial: 7.43 (ref 7.350–7.450)
pO2, Arterial: 101 mmHg (ref 83.0–108.0)

## 2021-10-02 LAB — URINALYSIS, ROUTINE W REFLEX MICROSCOPIC
Bilirubin Urine: NEGATIVE
Glucose, UA: NEGATIVE mg/dL
Hgb urine dipstick: NEGATIVE
Ketones, ur: NEGATIVE mg/dL
Leukocytes,Ua: NEGATIVE
Nitrite: NEGATIVE
Protein, ur: NEGATIVE mg/dL
Specific Gravity, Urine: 1.02 (ref 1.005–1.030)
pH: 7 (ref 5.0–8.0)

## 2021-10-02 LAB — TYPE AND SCREEN
ABO/RH(D): A NEG
Antibody Screen: NEGATIVE

## 2021-10-02 LAB — PROTIME-INR
INR: 1 (ref 0.8–1.2)
Prothrombin Time: 13.1 seconds (ref 11.4–15.2)

## 2021-10-02 LAB — APTT: aPTT: 29 seconds (ref 24–36)

## 2021-10-02 LAB — SURGICAL PCR SCREEN
MRSA, PCR: NEGATIVE
Staphylococcus aureus: NEGATIVE

## 2021-10-02 LAB — HEMOGLOBIN A1C
Hgb A1c MFr Bld: 5.4 % (ref 4.8–5.6)
Mean Plasma Glucose: 108.28 mg/dL

## 2021-10-02 LAB — SARS CORONAVIRUS 2 (TAT 6-24 HRS): SARS Coronavirus 2: NEGATIVE

## 2021-10-02 NOTE — Progress Notes (Signed)
PCP - Dr. [name] Cardiologist - Dr. Jenkins Rouge  PPM/ICD - n/a Device Orders -  Rep Notified -   Chest x-ray - 10/02/21 EKG - 10/02/21 Stress Test - 04/05/16 ECHO - 08/08/21 Cardiac Cath - 09/01/21  Sleep Study - n/a CPAP -   Fasting Blood Sugar -  n/a Checks Blood Sugar _____ times a day  Blood Thinner Instructions: Xarelto, last dose on 09/30/21 per Dr. Leonarda Salon office Aspirin Instructions:  ERAS Protcol - npo per order PRE-SURGERY Ensure or G2-   COVID TEST- at PAT appointment   Anesthesia review: yes, recent cath and echo  Patient denies shortness of breath, fever, cough and chest pain at PAT appointment   All instructions explained to the patient, with a verbal understanding of the material. Patient agrees to go over the instructions while at home for a better understanding. Patient also instructed to self quarantine after being tested for COVID-19. The opportunity to ask questions was provided.

## 2021-10-02 NOTE — Progress Notes (Signed)
Pre op AVR has been completed.   Preliminary results in CV Proc.   Adrian Franco 10/02/2021 1:43 PM

## 2021-10-02 NOTE — Progress Notes (Signed)
Surgical Instructions   Your procedure is scheduled on Wednesday 10/04/2021.  Report to [facility] Main Entrance "A" at 06:30 A.M., then check in with the Admitting office.  Call [phone] if you have problems or questions between now and the morning of surgery:   Remember: Do not eat or drink after midnight the night before your surgery  Take these medicines the morning of surgery with A SIP OF WATER:  Omeprazole (Prilosec) Polyvinyl Alcohol-povidone (Refresh OP) eye drop Pregabalin (Lyrica) Simvastatin (Zocor) Tamsulosin (Flomax)  If needed you may take these medications the morning of surgery: Acetaminophen (Tylenol) Tizanidine (Zanaflex)  As of today, STOP taking any Aspirin (unless otherwise instructed by your surgeon) or Aspirin-containing products; NSAIDS - Aleve, Naproxen, Ibuprofen, Motrin, Advil, Goody's, BC's, all herbal medications, fish oil, and all vitamins.  Hold Xarelto 2 days prior to your surgery.  Your last dose should have been on 10/01/21 per Dr. Leonarda Salon office.   After your pre-procedure COVID test today  You are not required to quarantine however you are required to wear a well-fitting mask when you are out and around people not in your household.  If your mask becomes wet or soiled, replace with a new one.  Wash your hands often with soap and water for 20 seconds or clean your hands with an alcohol-based hand sanitizer that contains at least 60% alcohol.  Do not share personal items.  Notify your provider: if you are in close contact with someone who has COVID  or if you develop a fever of 100.4 or greater, sneezing, cough, sore throat, shortness of breath or body aches.          Do not wear jewelry   Do not wear lotions, powders, colognes, or deodorant.  Do not shave 48 hours prior to surgery.  Men may shave face and neck.  Do not bring valuables to the hospital - [facility] is not responsible for any belongings or valuables.  Do NOT  Smoke (Tobacco/Vaping) or drink Alcohol 24 hours prior to your procedure  If you use a CPAP at night, please bring your mask for your overnight stay.   Contacts, glasses, hearing aids, dentures or partials may not be worn into surgery, please bring cases for these belongings   For patients admitted to the hospital, discharge time will be determined by your treatment team.   Patients discharged the day of surgery will not be allowed to drive home, and someone needs to stay with them for 24 hours.  NO VISITORS WILL BE ALLOWED IN PRE-OP WHERE PATIENTS ARE PREPPED FOR SURGERY.  ONLY 1 SUPPORT PERSON MAY BE PRESENT IN THE WAITING ROOM WHILE YOU ARE IN SURGERY.  IF YOU ARE TO BE ADMITTED, ONCE YOU ARE IN YOUR ROOM YOU WILL BE ALLOWED TWO (2) VISITORS. 1 (ONE) VISITOR MAY STAY OVERNIGHT BUT MUST ARRIVE TO THE ROOM BY 8pm.  Minor children may have two parents present. Special consideration for safety and communication needs will be reviewed on a case by case basis.  Special instructions:    Oral Hygiene is also important to reduce your risk of infection.  Remember - BRUSH YOUR TEETH THE MORNING OF SURGERY WITH YOUR REGULAR TOOTHPASTE   [place]- Preparing For Surgery  Before surgery, you can play an important role. Because skin is not sterile, your skin needs to be as free of germs as possible. You can reduce the number of germs on your skin by washing with CHG (chlorahexidine gluconate) Soap before surgery.  CHG is an antiseptic cleaner which kills germs and bonds with the skin to continue killing germs even after washing.     Please do not use if you have an allergy to CHG or antibacterial soaps. If your skin becomes reddened/irritated stop using the CHG.  Do not shave (including legs and underarms) for at least 48 hours prior to first CHG shower. It is OK to shave your face.  Please follow these instructions carefully.     Shower the NIGHT BEFORE SURGERY and the MORNING OF SURGERY with CHG  Soap.   If you chose to wash your hair, wash your hair first as usual with your normal shampoo. After you shampoo, rinse your hair and body thoroughly to remove the shampoo.    Then [employer] and genitals (private parts) with your normal soap and rinse thoroughly to remove soap.  Next use the CHG Soap as you would any other liquid soap. You can apply CHG directly to the skin and wash gently with a clean washcloth.   Apply the CHG Soap to your body ONLY FROM THE NECK DOWN.  Do not use on open wounds or open sores. Avoid contact with your eyes, ears, mouth and genitals (private parts). Wash Face and genitals (private parts)  with your normal soap.   Wash thoroughly, paying special attention to the area where your surgery will be performed.  Thoroughly rinse your body with warm water from the neck down.  DO NOT shower/wash with your normal soap after using and rinsing off the CHG Soap.  Pat yourself dry with a CLEAN TOWEL.  Wear CLEAN PAJAMAS to bed the night before surgery  Place CLEAN SHEETS on your bed the night before your surgery  DO NOT SLEEP WITH PETS.   Day of Surgery:  Take a shower with CHG soap. Wear Clean/Comfortable clothing the morning of surgery Do not apply any deodorants/lotions.   Remember to brush your teeth WITH YOUR REGULAR TOOTHPASTE.   Please read over the fact sheets that you were given.

## 2021-10-03 MED ORDER — VANCOMYCIN HCL 1500 MG/300ML IV SOLN
1500.0000 mg | INTRAVENOUS | Status: AC
  Administered 2021-10-04: 1500 mg via INTRAVENOUS
  Filled 2021-10-03: qty 300

## 2021-10-03 MED ORDER — HEPARIN 30,000 UNITS/1000 ML (OHS) CELLSAVER SOLUTION
Status: DC
  Filled 2021-10-03: qty 1000

## 2021-10-03 MED ORDER — MANNITOL 20 % IV SOLN
INTRAVENOUS | Status: DC
  Filled 2021-10-03: qty 13

## 2021-10-03 MED ORDER — TRANEXAMIC ACID 1000 MG/10ML IV SOLN
1.5000 mg/kg/h | INTRAVENOUS | Status: AC
  Administered 2021-10-04: 1.5 mg/kg/h via INTRAVENOUS
  Filled 2021-10-03: qty 25

## 2021-10-03 MED ORDER — TRANEXAMIC ACID (OHS) BOLUS VIA INFUSION
15.0000 mg/kg | INTRAVENOUS | Status: AC
  Administered 2021-10-04: 1372.5 mg via INTRAVENOUS
  Filled 2021-10-03: qty 1373

## 2021-10-03 MED ORDER — POTASSIUM CHLORIDE 2 MEQ/ML IV SOLN
80.0000 meq | INTRAVENOUS | Status: DC
  Filled 2021-10-03: qty 40

## 2021-10-03 MED ORDER — DEXMEDETOMIDINE HCL IN NACL 400 MCG/100ML IV SOLN
0.1000 ug/kg/h | INTRAVENOUS | Status: AC
  Administered 2021-10-04: .7 ug/kg/h via INTRAVENOUS
  Filled 2021-10-03: qty 100

## 2021-10-03 MED ORDER — PLASMA-LYTE A IV SOLN
INTRAVENOUS | Status: DC
  Filled 2021-10-03: qty 2.5

## 2021-10-03 MED ORDER — TRANEXAMIC ACID (OHS) PUMP PRIME SOLUTION
2.0000 mg/kg | INTRAVENOUS | Status: DC
  Filled 2021-10-03: qty 1.83

## 2021-10-03 MED ORDER — CEFAZOLIN SODIUM-DEXTROSE 2-4 GM/100ML-% IV SOLN
2.0000 g | INTRAVENOUS | Status: DC
  Filled 2021-10-03: qty 100

## 2021-10-03 MED ORDER — CEFAZOLIN SODIUM-DEXTROSE 2-4 GM/100ML-% IV SOLN
2.0000 g | INTRAVENOUS | Status: AC
  Administered 2021-10-04 (×2): 2 g via INTRAVENOUS
  Filled 2021-10-03: qty 100

## 2021-10-03 MED ORDER — NITROGLYCERIN IN D5W 200-5 MCG/ML-% IV SOLN
2.0000 ug/min | INTRAVENOUS | Status: AC
  Administered 2021-10-04: 5 ug/min via INTRAVENOUS
  Filled 2021-10-03: qty 250

## 2021-10-03 MED ORDER — NOREPINEPHRINE 4 MG/250ML-% IV SOLN
0.0000 ug/min | INTRAVENOUS | Status: DC
  Filled 2021-10-03: qty 250

## 2021-10-03 MED ORDER — INSULIN REGULAR(HUMAN) IN NACL 100-0.9 UT/100ML-% IV SOLN
INTRAVENOUS | Status: AC
  Administered 2021-10-04: 1 [IU]/h via INTRAVENOUS
  Filled 2021-10-03: qty 100

## 2021-10-03 MED ORDER — EPINEPHRINE HCL 5 MG/250ML IV SOLN IN NS
0.0000 ug/min | INTRAVENOUS | Status: DC
  Filled 2021-10-03: qty 250

## 2021-10-03 MED ORDER — PHENYLEPHRINE HCL-NACL 20-0.9 MG/250ML-% IV SOLN
30.0000 ug/min | INTRAVENOUS | Status: AC
  Administered 2021-10-04: 20 ug/min via INTRAVENOUS
  Administered 2021-10-04: 35 ug/min via INTRAVENOUS
  Filled 2021-10-03: qty 250

## 2021-10-03 MED ORDER — MILRINONE LACTATE IN DEXTROSE 20-5 MG/100ML-% IV SOLN
0.3000 ug/kg/min | INTRAVENOUS | Status: DC
  Filled 2021-10-03: qty 100

## 2021-10-03 NOTE — Anesthesia Preprocedure Evaluation (Addendum)
Anesthesia Evaluation  Patient identified by MRN, date of birth, ID band Patient awake    Reviewed: Allergy & Precautions, NPO status , Patient's Chart, lab work & pertinent test results  History of Anesthesia Complications Negative for: history of anesthetic complications  Airway Mallampati: II  TM Distance: >3 FB Neck ROM: Full    Dental no notable dental hx. (+) Dental Advisory Given   Pulmonary sleep apnea , PE   Pulmonary exam normal        Cardiovascular hypertension, + DVT  + Valvular Problems/Murmurs AI  Rhythm:Regular Rate:Normal + Diastolic murmurs Narrative  Prox LAD to Mid LAD lesion is 25% stenosed with 25% stenosed side  branch in 1st Diag.  Dist LM lesion is 10% stenosed.  Ost Cx to Prox Cx lesion is 25% stenosed.  1st Mrg lesion is 25% stenosed.  LV end diastolic pressure is mildly elevated.  There is no aortic valve stenosis.  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  Nonobstructive coronary disease. Continue with work-up for severe aortic  regurgitation  Echo 08/08/21: 1. Left ventricular ejection fraction, by estimation, is 60 to 65%. The left ventricle has normal function. The left ventricle has no regional wall motion abnormalities. There is mild left ventricular hypertrophy. Left ventricular diastolic parameters  are consistent with Grade I diastolic dysfunction (impaired relaxation). The average left ventricular global longitudinal strain is -22.3 %. The global longitudinal strain is normal. 2. Right ventricular systolic function is normal. The right ventricular size is normal. Tricuspid regurgitation signal is inadequate for assessing PA pressure. 3. The mitral valve is abnormal.  Trivial mitral valve regurgitation. 4. AI is posteriorly directed and restricts the anterior mitral leaflet. The aortic valve is tricuspid. Aortic valve regurgitation is moderate to severe. Aortic valve sclerosis/calcification is present, without any evidence of aortic stenosis. Aortic  regurgitation PHT measures 420 msec. 5. Aortic dilatation noted. There is moderate dilatation of the aortic root, measuring 47 mm. There is mild dilatation of the ascending aorta, measuring 44 mm. 6. The inferior vena cava is dilated in size with >50% respiratory variability, suggesting right atrial pressure of 8 mmHg.   Neuro/Psych Anxiety Depression negative neurological ROS     GI/Hepatic Neg liver ROS, GERD  ,  Endo/Other  negative endocrine ROS  Renal/GU negative Renal ROS  negative genitourinary   Musculoskeletal negative musculoskeletal ROS (+)   Abdominal   Peds  Hematology negative hematology ROS (+)   Anesthesia Other Findings   Reproductive/Obstetrics                            Anesthesia Physical  Anesthesia Plan  ASA: 4  Anesthesia Plan: General   Post-op Pain Management:    Induction: Intravenous  PONV Risk Score and Plan: 3 and Treatment may vary due to age or medical condition, Midazolam, Ondansetron and Dexamethasone  Airway Management Planned: Oral ETT  Additional Equipment: Arterial line, 3D TEE, Ultrasound Guidance Line Placement and PA Cath  Intra-op Plan:   Post-operative Plan: Post-operative intubation/ventilation  Informed Consent: I have reviewed the patients History and Physical, chart, labs and discussed the procedure including the risks, benefits and alternatives for the proposed anesthesia with the patient or authorized representative who has indicated his/her understanding and acceptance.     Dental  advisory given  Plan Discussed with: Anesthesiologist and CRNA  Anesthesia Plan Comments:        Anesthesia Quick  Evaluation

## 2021-10-04 ENCOUNTER — Inpatient Hospital Stay (HOSPITAL_COMMUNITY)
Admission: RE | Disposition: A | Discharge status: Home / Self Care | Payer: Self-pay | Source: Home / Self Care | Attending: Thoracic Surgery (Cardiothoracic Vascular Surgery)

## 2021-10-04 ENCOUNTER — Inpatient Hospital Stay (HOSPITAL_COMMUNITY): Payer: Medicare Other | Admitting: Certified Registered Nurse Anesthetist

## 2021-10-04 ENCOUNTER — Inpatient Hospital Stay (HOSPITAL_COMMUNITY)
Admission: RE | Admit: 2021-10-04 | Discharge: 2021-10-10 | DRG: 220 | Disposition: A | Discharge status: Home / Self Care | Payer: Medicare Other | Attending: Thoracic Surgery (Cardiothoracic Vascular Surgery) | Admitting: Thoracic Surgery (Cardiothoracic Vascular Surgery)

## 2021-10-04 ENCOUNTER — Inpatient Hospital Stay (HOSPITAL_COMMUNITY): Payer: Medicare Other

## 2021-10-04 DIAGNOSIS — Z86711 Personal history of pulmonary embolism: Secondary | ICD-10-CM | POA: Diagnosis not present

## 2021-10-04 DIAGNOSIS — I2510 Atherosclerotic heart disease of native coronary artery without angina pectoris: Secondary | ICD-10-CM | POA: Diagnosis present

## 2021-10-04 DIAGNOSIS — M1990 Unspecified osteoarthritis, unspecified site: Secondary | ICD-10-CM | POA: Diagnosis present

## 2021-10-04 DIAGNOSIS — D696 Thrombocytopenia, unspecified: Secondary | ICD-10-CM | POA: Diagnosis present

## 2021-10-04 DIAGNOSIS — Z954 Presence of other heart-valve replacement: Secondary | ICD-10-CM

## 2021-10-04 DIAGNOSIS — K219 Gastro-esophageal reflux disease without esophagitis: Secondary | ICD-10-CM | POA: Diagnosis present

## 2021-10-04 DIAGNOSIS — M810 Age-related osteoporosis without current pathological fracture: Secondary | ICD-10-CM | POA: Diagnosis present

## 2021-10-04 DIAGNOSIS — Z79899 Other long term (current) drug therapy: Secondary | ICD-10-CM

## 2021-10-04 DIAGNOSIS — Z7901 Long term (current) use of anticoagulants: Secondary | ICD-10-CM

## 2021-10-04 DIAGNOSIS — D72829 Elevated white blood cell count, unspecified: Secondary | ICD-10-CM | POA: Diagnosis present

## 2021-10-04 DIAGNOSIS — I712 Thoracic aortic aneurysm, without rupture, unspecified: Secondary | ICD-10-CM | POA: Diagnosis not present

## 2021-10-04 DIAGNOSIS — I440 Atrioventricular block, first degree: Secondary | ICD-10-CM | POA: Diagnosis present

## 2021-10-04 DIAGNOSIS — G2581 Restless legs syndrome: Secondary | ICD-10-CM | POA: Diagnosis present

## 2021-10-04 DIAGNOSIS — M549 Dorsalgia, unspecified: Secondary | ICD-10-CM | POA: Diagnosis present

## 2021-10-04 DIAGNOSIS — N400 Enlarged prostate without lower urinary tract symptoms: Secondary | ICD-10-CM | POA: Diagnosis present

## 2021-10-04 DIAGNOSIS — G4733 Obstructive sleep apnea (adult) (pediatric): Secondary | ICD-10-CM | POA: Diagnosis present

## 2021-10-04 DIAGNOSIS — E8770 Fluid overload, unspecified: Secondary | ICD-10-CM | POA: Diagnosis present

## 2021-10-04 DIAGNOSIS — E291 Testicular hypofunction: Secondary | ICD-10-CM | POA: Diagnosis present

## 2021-10-04 DIAGNOSIS — Z86718 Personal history of other venous thrombosis and embolism: Secondary | ICD-10-CM

## 2021-10-04 DIAGNOSIS — R42 Dizziness and giddiness: Secondary | ICD-10-CM | POA: Diagnosis present

## 2021-10-04 DIAGNOSIS — Q2543 Congenital aneurysm of aorta: Secondary | ICD-10-CM | POA: Diagnosis not present

## 2021-10-04 DIAGNOSIS — I351 Nonrheumatic aortic (valve) insufficiency: Principal | ICD-10-CM | POA: Diagnosis present

## 2021-10-04 DIAGNOSIS — I4891 Unspecified atrial fibrillation: Secondary | ICD-10-CM | POA: Diagnosis present

## 2021-10-04 DIAGNOSIS — I808 Phlebitis and thrombophlebitis of other sites: Secondary | ICD-10-CM | POA: Diagnosis present

## 2021-10-04 DIAGNOSIS — F419 Anxiety disorder, unspecified: Secondary | ICD-10-CM | POA: Diagnosis present

## 2021-10-04 DIAGNOSIS — G8929 Other chronic pain: Secondary | ICD-10-CM | POA: Diagnosis present

## 2021-10-04 DIAGNOSIS — I10 Essential (primary) hypertension: Secondary | ICD-10-CM | POA: Diagnosis present

## 2021-10-04 DIAGNOSIS — F32A Depression, unspecified: Secondary | ICD-10-CM | POA: Diagnosis present

## 2021-10-04 DIAGNOSIS — Z20822 Contact with and (suspected) exposure to covid-19: Secondary | ICD-10-CM | POA: Diagnosis present

## 2021-10-04 DIAGNOSIS — I7121 Aneurysm of the ascending aorta, without rupture: Secondary | ICD-10-CM

## 2021-10-04 DIAGNOSIS — J9811 Atelectasis: Secondary | ICD-10-CM | POA: Diagnosis present

## 2021-10-04 DIAGNOSIS — E785 Hyperlipidemia, unspecified: Secondary | ICD-10-CM | POA: Diagnosis present

## 2021-10-04 DIAGNOSIS — Z79890 Hormone replacement therapy (postmenopausal): Secondary | ICD-10-CM

## 2021-10-04 DIAGNOSIS — N179 Acute kidney failure, unspecified: Secondary | ICD-10-CM | POA: Diagnosis not present

## 2021-10-04 DIAGNOSIS — D62 Acute posthemorrhagic anemia: Secondary | ICD-10-CM | POA: Diagnosis not present

## 2021-10-04 DIAGNOSIS — Z09 Encounter for follow-up examination after completed treatment for conditions other than malignant neoplasm: Secondary | ICD-10-CM

## 2021-10-04 DIAGNOSIS — Z87442 Personal history of urinary calculi: Secondary | ICD-10-CM

## 2021-10-04 DIAGNOSIS — Z952 Presence of prosthetic heart valve: Secondary | ICD-10-CM

## 2021-10-04 DIAGNOSIS — Z951 Presence of aortocoronary bypass graft: Secondary | ICD-10-CM

## 2021-10-04 HISTORY — PX: ENDOVEIN HARVEST OF GREATER SAPHENOUS VEIN: SHX5059

## 2021-10-04 HISTORY — PX: BENTALL PROCEDURE: SHX5058

## 2021-10-04 HISTORY — PX: TEE WITHOUT CARDIOVERSION: SHX5443

## 2021-10-04 HISTORY — PX: CORONARY ARTERY BYPASS GRAFT: SHX141

## 2021-10-04 LAB — POCT I-STAT, CHEM 8
BUN: 20 mg/dL (ref 8–23)
BUN: 18 mg/dL (ref 8–23)
BUN: 19 mg/dL (ref 8–23)
BUN: 20 mg/dL (ref 8–23)
BUN: 20 mg/dL (ref 8–23)
Calcium, Ion: 1.25 mmol/L (ref 1.15–1.40)
Calcium, Ion: 1.09 mmol/L — ABNORMAL LOW (ref 1.15–1.40)
Calcium, Ion: 1.24 mmol/L (ref 1.15–1.40)
Calcium, Ion: 1.1 mmol/L — ABNORMAL LOW (ref 1.15–1.40)
Calcium, Ion: 1.11 mmol/L — ABNORMAL LOW (ref 1.15–1.40)
Chloride: 102 mmol/L (ref 98–111)
Chloride: 102 mmol/L (ref 98–111)
Chloride: 102 mmol/L (ref 98–111)
Chloride: 102 mmol/L (ref 98–111)
Chloride: 103 mmol/L (ref 98–111)
Creatinine, Ser: 1 mg/dL (ref 0.61–1.24)
Creatinine, Ser: 1 mg/dL (ref 0.61–1.24)
Creatinine, Ser: 1 mg/dL (ref 0.61–1.24)
Creatinine, Ser: 1 mg/dL (ref 0.61–1.24)
Creatinine, Ser: 0.9 mg/dL (ref 0.61–1.24)
Glucose, Bld: 105 mg/dL — ABNORMAL HIGH (ref 70–99)
Glucose, Bld: 113 mg/dL — ABNORMAL HIGH (ref 70–99)
Glucose, Bld: 142 mg/dL — ABNORMAL HIGH (ref 70–99)
Glucose, Bld: 201 mg/dL — ABNORMAL HIGH (ref 70–99)
Glucose, Bld: 164 mg/dL — ABNORMAL HIGH (ref 70–99)
HCT: 41 % (ref 39.0–52.0)
HCT: 28 % — ABNORMAL LOW (ref 39.0–52.0)
HCT: 35 % — ABNORMAL LOW (ref 39.0–52.0)
HCT: 28 % — ABNORMAL LOW (ref 39.0–52.0)
HCT: 30 % — ABNORMAL LOW (ref 39.0–52.0)
Hemoglobin: 13.9 g/dL (ref 13.0–17.0)
Hemoglobin: 11.9 g/dL — ABNORMAL LOW (ref 13.0–17.0)
Hemoglobin: 9.5 g/dL — ABNORMAL LOW (ref 13.0–17.0)
Hemoglobin: 9.5 g/dL — ABNORMAL LOW (ref 13.0–17.0)
Hemoglobin: 10.2 g/dL — ABNORMAL LOW (ref 13.0–17.0)
Potassium: 3.9 mmol/L (ref 3.5–5.1)
Potassium: 4.1 mmol/L (ref 3.5–5.1)
Potassium: 4.9 mmol/L (ref 3.5–5.1)
Potassium: 5.3 mmol/L — ABNORMAL HIGH (ref 3.5–5.1)
Potassium: 4.7 mmol/L (ref 3.5–5.1)
Sodium: 139 mmol/L (ref 135–145)
Sodium: 136 mmol/L (ref 135–145)
Sodium: 137 mmol/L (ref 135–145)
Sodium: 135 mmol/L (ref 135–145)
Sodium: 136 mmol/L (ref 135–145)
TCO2: 28 mmol/L (ref 22–32)
TCO2: 26 mmol/L (ref 22–32)
TCO2: 29 mmol/L (ref 22–32)
TCO2: 29 mmol/L (ref 22–32)
TCO2: 25 mmol/L (ref 22–32)

## 2021-10-04 LAB — POCT I-STAT 7, (LYTES, BLD GAS, ICA,H+H)
Acid-Base Excess: 1 mmol/L (ref 0.0–2.0)
Acid-Base Excess: 1 mmol/L (ref 0.0–2.0)
Acid-base deficit: 1 mmol/L (ref 0.0–2.0)
Acid-base deficit: 3 mmol/L — ABNORMAL HIGH (ref 0.0–2.0)
Acid-base deficit: 3 mmol/L — ABNORMAL HIGH (ref 0.0–2.0)
Acid-base deficit: 4 mmol/L — ABNORMAL HIGH (ref 0.0–2.0)
Acid-base deficit: 7 mmol/L — ABNORMAL HIGH (ref 0.0–2.0)
Acid-base deficit: 2 mmol/L (ref 0.0–2.0)
Bicarbonate: 28.5 mmol/L — ABNORMAL HIGH (ref 20.0–28.0)
Bicarbonate: 26.3 mmol/L (ref 20.0–28.0)
Bicarbonate: 23.8 mmol/L (ref 20.0–28.0)
Bicarbonate: 24.1 mmol/L (ref 20.0–28.0)
Bicarbonate: 23.6 mmol/L (ref 20.0–28.0)
Bicarbonate: 21.5 mmol/L (ref 20.0–28.0)
Bicarbonate: 19.8 mmol/L — ABNORMAL LOW (ref 20.0–28.0)
Bicarbonate: 23.9 mmol/L (ref 20.0–28.0)
Calcium, Ion: 1.28 mmol/L (ref 1.15–1.40)
Calcium, Ion: 1.07 mmol/L — ABNORMAL LOW (ref 1.15–1.40)
Calcium, Ion: 1.11 mmol/L — ABNORMAL LOW (ref 1.15–1.40)
Calcium, Ion: 1.13 mmol/L — ABNORMAL LOW (ref 1.15–1.40)
Calcium, Ion: 1.17 mmol/L (ref 1.15–1.40)
Calcium, Ion: 1.18 mmol/L (ref 1.15–1.40)
Calcium, Ion: 1.16 mmol/L (ref 1.15–1.40)
Calcium, Ion: 1.12 mmol/L — ABNORMAL LOW (ref 1.15–1.40)
HCT: 38 % — ABNORMAL LOW (ref 39.0–52.0)
HCT: 30 % — ABNORMAL LOW (ref 39.0–52.0)
HCT: 28 % — ABNORMAL LOW (ref 39.0–52.0)
HCT: 33 % — ABNORMAL LOW (ref 39.0–52.0)
HCT: 39 % (ref 39.0–52.0)
HCT: 37 % — ABNORMAL LOW (ref 39.0–52.0)
HCT: 38 % — ABNORMAL LOW (ref 39.0–52.0)
HCT: 37 % — ABNORMAL LOW (ref 39.0–52.0)
Hemoglobin: 12.9 g/dL — ABNORMAL LOW (ref 13.0–17.0)
Hemoglobin: 10.2 g/dL — ABNORMAL LOW (ref 13.0–17.0)
Hemoglobin: 9.5 g/dL — ABNORMAL LOW (ref 13.0–17.0)
Hemoglobin: 11.2 g/dL — ABNORMAL LOW (ref 13.0–17.0)
Hemoglobin: 13.3 g/dL (ref 13.0–17.0)
Hemoglobin: 12.6 g/dL — ABNORMAL LOW (ref 13.0–17.0)
Hemoglobin: 12.9 g/dL — ABNORMAL LOW (ref 13.0–17.0)
Hemoglobin: 12.6 g/dL — ABNORMAL LOW (ref 13.0–17.0)
O2 Saturation: 100 %
O2 Saturation: 100 %
O2 Saturation: 100 %
O2 Saturation: 98 %
O2 Saturation: 99 %
O2 Saturation: 99 %
O2 Saturation: 97 %
O2 Saturation: 94 %
Patient temperature: 36.4
Patient temperature: 36.8
Patient temperature: 37.3
Patient temperature: 37.9
Patient temperature: 38
Potassium: 3.9 mmol/L (ref 3.5–5.1)
Potassium: 4.3 mmol/L (ref 3.5–5.1)
Potassium: 4.7 mmol/L (ref 3.5–5.1)
Potassium: 4.6 mmol/L (ref 3.5–5.1)
Potassium: 4.6 mmol/L (ref 3.5–5.1)
Potassium: 5 mmol/L (ref 3.5–5.1)
Potassium: 4.6 mmol/L (ref 3.5–5.1)
Potassium: 4.9 mmol/L (ref 3.5–5.1)
Sodium: 138 mmol/L (ref 135–145)
Sodium: 138 mmol/L (ref 135–145)
Sodium: 137 mmol/L (ref 135–145)
Sodium: 139 mmol/L (ref 135–145)
Sodium: 138 mmol/L (ref 135–145)
Sodium: 138 mmol/L (ref 135–145)
Sodium: 138 mmol/L (ref 135–145)
Sodium: 138 mmol/L (ref 135–145)
TCO2: 30 mmol/L (ref 22–32)
TCO2: 28 mmol/L (ref 22–32)
TCO2: 25 mmol/L (ref 22–32)
TCO2: 26 mmol/L (ref 22–32)
TCO2: 25 mmol/L (ref 22–32)
TCO2: 23 mmol/L (ref 22–32)
TCO2: 21 mmol/L — ABNORMAL LOW (ref 22–32)
TCO2: 25 mmol/L (ref 22–32)
pCO2 arterial: 55.6 mmHg — ABNORMAL HIGH (ref 32.0–48.0)
pCO2 arterial: 42.5 mmHg (ref 32.0–48.0)
pCO2 arterial: 41.2 mmHg (ref 32.0–48.0)
pCO2 arterial: 47.7 mmHg (ref 32.0–48.0)
pCO2 arterial: 47.1 mmHg (ref 32.0–48.0)
pCO2 arterial: 42.1 mmHg (ref 32.0–48.0)
pCO2 arterial: 45.4 mmHg (ref 32.0–48.0)
pCO2 arterial: 45.5 mmHg (ref 32.0–48.0)
pH, Arterial: 7.318 — ABNORMAL LOW (ref 7.350–7.450)
pH, Arterial: 7.4 (ref 7.350–7.450)
pH, Arterial: 7.369 (ref 7.350–7.450)
pH, Arterial: 7.309 — ABNORMAL LOW (ref 7.350–7.450)
pH, Arterial: 7.307 — ABNORMAL LOW (ref 7.350–7.450)
pH, Arterial: 7.317 — ABNORMAL LOW (ref 7.350–7.450)
pH, Arterial: 7.253 — ABNORMAL LOW (ref 7.350–7.450)
pH, Arterial: 7.332 — ABNORMAL LOW (ref 7.350–7.450)
pO2, Arterial: 466 mmHg — ABNORMAL HIGH (ref 83.0–108.0)
pO2, Arterial: 355 mmHg — ABNORMAL HIGH (ref 83.0–108.0)
pO2, Arterial: 222 mmHg — ABNORMAL HIGH (ref 83.0–108.0)
pO2, Arterial: 116 mmHg — ABNORMAL HIGH (ref 83.0–108.0)
pO2, Arterial: 146 mmHg — ABNORMAL HIGH (ref 83.0–108.0)
pO2, Arterial: 127 mmHg — ABNORMAL HIGH (ref 83.0–108.0)
pO2, Arterial: 114 mmHg — ABNORMAL HIGH (ref 83.0–108.0)
pO2, Arterial: 81 mmHg — ABNORMAL LOW (ref 83.0–108.0)

## 2021-10-04 LAB — ECHO INTRAOPERATIVE TEE
AR max vel: 1.62 cm2
AV Area VTI: 1.75 cm2
AV Area mean vel: 1.73 cm2
AV Mean grad: 5 mmHg
AV Peak grad: 8.8 mmHg
AV Vena cont: 3.4 cm
Ao pk vel: 1.48 m/s
Area-P 1/2: 2.85 cm2
Height: 69 in
P 1/2 time: 738 msec
S' Lateral: 3.1 cm
Weight: 3136 oz

## 2021-10-04 LAB — GLUCOSE, CAPILLARY
Glucose-Capillary: 124 mg/dL — ABNORMAL HIGH (ref 70–99)
Glucose-Capillary: 145 mg/dL — ABNORMAL HIGH (ref 70–99)
Glucose-Capillary: 75 mg/dL (ref 70–99)
Glucose-Capillary: 104 mg/dL — ABNORMAL HIGH (ref 70–99)
Glucose-Capillary: 135 mg/dL — ABNORMAL HIGH (ref 70–99)
Glucose-Capillary: 140 mg/dL — ABNORMAL HIGH (ref 70–99)
Glucose-Capillary: 87 mg/dL (ref 70–99)
Glucose-Capillary: 128 mg/dL — ABNORMAL HIGH (ref 70–99)
Glucose-Capillary: 137 mg/dL — ABNORMAL HIGH (ref 70–99)

## 2021-10-04 LAB — POCT I-STAT EG7
Acid-Base Excess: 2 mmol/L (ref 0.0–2.0)
Bicarbonate: 27.4 mmol/L (ref 20.0–28.0)
Calcium, Ion: 1.11 mmol/L — ABNORMAL LOW (ref 1.15–1.40)
HCT: 30 % — ABNORMAL LOW (ref 39.0–52.0)
Hemoglobin: 10.2 g/dL — ABNORMAL LOW (ref 13.0–17.0)
O2 Saturation: 81 %
Potassium: 4.2 mmol/L (ref 3.5–5.1)
Sodium: 138 mmol/L (ref 135–145)
TCO2: 29 mmol/L (ref 22–32)
pCO2, Ven: 47.8 mmHg (ref 44.0–60.0)
pH, Ven: 7.367 (ref 7.250–7.430)
pO2, Ven: 47 mmHg — ABNORMAL HIGH (ref 32.0–45.0)

## 2021-10-04 LAB — CBC
HCT: 37.4 % — ABNORMAL LOW (ref 39.0–52.0)
HCT: 38.7 % — ABNORMAL LOW (ref 39.0–52.0)
Hemoglobin: 12.7 g/dL — ABNORMAL LOW (ref 13.0–17.0)
Hemoglobin: 13.1 g/dL (ref 13.0–17.0)
MCH: 31 pg (ref 26.0–34.0)
MCH: 30.8 pg (ref 26.0–34.0)
MCHC: 34 g/dL (ref 30.0–36.0)
MCHC: 33.9 g/dL (ref 30.0–36.0)
MCV: 91.2 fL (ref 80.0–100.0)
MCV: 90.8 fL (ref 80.0–100.0)
Platelets: 131 10*3/uL — ABNORMAL LOW (ref 150–400)
Platelets: 154 10*3/uL (ref 150–400)
RBC: 4.1 MIL/uL — ABNORMAL LOW (ref 4.22–5.81)
RBC: 4.26 MIL/uL (ref 4.22–5.81)
RDW: 12.1 % (ref 11.5–15.5)
RDW: 12.2 % (ref 11.5–15.5)
WBC: 10.2 10*3/uL (ref 4.0–10.5)
WBC: 13.5 10*3/uL — ABNORMAL HIGH (ref 4.0–10.5)
nRBC: 0 % (ref 0.0–0.2)
nRBC: 0 % (ref 0.0–0.2)

## 2021-10-04 LAB — BASIC METABOLIC PANEL
Anion gap: 10 (ref 5–15)
BUN: 17 mg/dL (ref 8–23)
CO2: 21 mmol/L — ABNORMAL LOW (ref 22–32)
Calcium: 7.4 mg/dL — ABNORMAL LOW (ref 8.9–10.3)
Chloride: 105 mmol/L (ref 98–111)
Creatinine, Ser: 1.17 mg/dL (ref 0.61–1.24)
GFR, Estimated: >60 mL/min (ref >60)
Glucose, Bld: 143 mg/dL — ABNORMAL HIGH (ref 70–99)
Potassium: 4.8 mmol/L (ref 3.5–5.1)
Sodium: 136 mmol/L (ref 135–145)

## 2021-10-04 LAB — HEMOGLOBIN AND HEMATOCRIT, BLOOD
HCT: 30.2 % — ABNORMAL LOW (ref 39.0–52.0)
Hemoglobin: 10.3 g/dL — ABNORMAL LOW (ref 13.0–17.0)

## 2021-10-04 LAB — PROTIME-INR
INR: 1.2 (ref 0.8–1.2)
Prothrombin Time: 15.3 seconds — ABNORMAL HIGH (ref 11.4–15.2)

## 2021-10-04 LAB — MAGNESIUM: Magnesium: 2.8 mg/dL — ABNORMAL HIGH (ref 1.7–2.4)

## 2021-10-04 LAB — APTT: aPTT: 30 seconds (ref 24–36)

## 2021-10-04 LAB — PLATELET COUNT: Platelets: 128 10*3/uL — ABNORMAL LOW (ref 150–400)

## 2021-10-04 SURGERY — BENTALL PROCEDURE
Anesthesia: General | Site: Leg Lower | Laterality: Right

## 2021-10-04 MED ORDER — SODIUM CHLORIDE 0.9% FLUSH: 10.0000 mL | INTRAVENOUS | Status: DC | PRN

## 2021-10-04 MED ORDER — DEXMEDETOMIDINE HCL IN NACL 400 MCG/100ML IV SOLN: 0.0000 ug/kg/h | INTRAVENOUS | Status: DC

## 2021-10-04 MED ORDER — METOPROLOL TARTRATE 5 MG/5ML IV SOLN: 2.5000 mg | INTRAVENOUS | Status: DC | PRN

## 2021-10-04 MED ORDER — ASPIRIN EC 325 MG PO TBEC
325.0000 mg | DELAYED_RELEASE_TABLET | Freq: Every day | ORAL | Status: DC
  Administered 2021-10-05: 325 mg via ORAL
  Filled 2021-10-04: qty 1

## 2021-10-04 MED ORDER — CHLORHEXIDINE GLUCONATE CLOTH 2 % EX PADS
6.0000 | MEDICATED_PAD | Freq: Every day | CUTANEOUS | Status: DC
  Administered 2021-10-06 (×2): 6 via TOPICAL

## 2021-10-04 MED ORDER — EPHEDRINE SULFATE-NACL 50-0.9 MG/10ML-% IV SOSY
PREFILLED_SYRINGE | INTRAVENOUS | Status: DC | PRN
  Administered 2021-10-04: 5 mg via INTRAVENOUS
  Administered 2021-10-04: 2.5 mg via INTRAVENOUS

## 2021-10-04 MED ORDER — ~~LOC~~ CARDIAC SURGERY, PATIENT & FAMILY EDUCATION
Freq: Once | Status: DC
  Filled 2021-10-04: qty 1

## 2021-10-04 MED ORDER — FENTANYL CITRATE (PF) 250 MCG/5ML IJ SOLN
INTRAMUSCULAR | Status: AC
  Filled 2021-10-04: qty 5

## 2021-10-04 MED ORDER — MIDAZOLAM HCL 2 MG/2ML IJ SOLN: 2.0000 mg | INTRAMUSCULAR | Status: DC | PRN

## 2021-10-04 MED ORDER — ACETAMINOPHEN 650 MG RE SUPP
650.0000 mg | Freq: Once | RECTAL | Status: AC
  Administered 2021-10-04: 650 mg via RECTAL

## 2021-10-04 MED ORDER — SODIUM CHLORIDE 0.9 % IV SOLN: INTRAVENOUS | Status: DC | PRN

## 2021-10-04 MED ORDER — SODIUM CHLORIDE 0.9% FLUSH
3.0000 mL | INTRAVENOUS | Status: DC | PRN
  Administered 2021-10-05: 3 mL via INTRAVENOUS

## 2021-10-04 MED ORDER — LACTATED RINGERS IV SOLN
INTRAVENOUS | Status: DC
Start: 2021-10-04 — End: 2021-10-06

## 2021-10-04 MED ORDER — PROTAMINE SULFATE 10 MG/ML IV SOLN
INTRAVENOUS | Status: AC
  Filled 2021-10-04: qty 25

## 2021-10-04 MED ORDER — HEPARIN SODIUM (PORCINE) 1000 UNIT/ML IJ SOLN
INTRAMUSCULAR | Status: AC
  Filled 2021-10-04: qty 1

## 2021-10-04 MED ORDER — ONDANSETRON HCL 4 MG/2ML IJ SOLN: 4.0000 mg | Freq: Four times a day (QID) | INTRAMUSCULAR | Status: DC | PRN

## 2021-10-04 MED ORDER — BISACODYL 10 MG RE SUPP: 10.0000 mg | Freq: Every day | RECTAL | Status: DC

## 2021-10-04 MED ORDER — DEXTROSE 50 % IV SOLN: 0.0000 mL | INTRAVENOUS | Status: DC | PRN

## 2021-10-04 MED ORDER — TRAMADOL HCL 50 MG PO TABS
50.0000 mg | ORAL_TABLET | ORAL | Status: DC | PRN
  Administered 2021-10-05 (×2): 100 mg via ORAL
  Filled 2021-10-04 (×2): qty 2

## 2021-10-04 MED ORDER — FENTANYL CITRATE (PF) 250 MCG/5ML IJ SOLN
INTRAMUSCULAR | Status: DC | PRN
  Administered 2021-10-04: 100 ug via INTRAVENOUS
  Administered 2021-10-04: 150 ug via INTRAVENOUS
  Administered 2021-10-04: 50 ug via INTRAVENOUS
  Administered 2021-10-04 (×2): 100 ug via INTRAVENOUS
  Administered 2021-10-04 (×3): 50 ug via INTRAVENOUS
  Administered 2021-10-04: 100 ug via INTRAVENOUS
  Administered 2021-10-04: 50 ug via INTRAVENOUS
  Administered 2021-10-04: 150 ug via INTRAVENOUS
  Administered 2021-10-04 (×3): 50 ug via INTRAVENOUS
  Administered 2021-10-04: 100 ug via INTRAVENOUS
  Administered 2021-10-04: 50 ug via INTRAVENOUS

## 2021-10-04 MED ORDER — PHENYLEPHRINE 40 MCG/ML (10ML) SYRINGE FOR IV PUSH (FOR BLOOD PRESSURE SUPPORT)
PREFILLED_SYRINGE | INTRAVENOUS | Status: DC | PRN
  Administered 2021-10-04 (×2): 40 ug via INTRAVENOUS
  Administered 2021-10-04: 80 ug via INTRAVENOUS

## 2021-10-04 MED ORDER — PLASMA-LYTE A IV SOLN: INTRAVENOUS | Status: DC | PRN

## 2021-10-04 MED ORDER — ROCURONIUM BROMIDE 10 MG/ML (PF) SYRINGE
PREFILLED_SYRINGE | INTRAVENOUS | Status: DC | PRN
  Administered 2021-10-04: 50 mg via INTRAVENOUS
  Administered 2021-10-04: 100 mg via INTRAVENOUS
  Administered 2021-10-04 (×2): 50 mg via INTRAVENOUS

## 2021-10-04 MED ORDER — HEPARIN SODIUM (PORCINE) 1000 UNIT/ML IJ SOLN
INTRAMUSCULAR | Status: DC | PRN
Start: 2021-10-04 — End: 2021-10-04
  Administered 2021-10-04: 28000 [IU] via INTRAVENOUS

## 2021-10-04 MED ORDER — MORPHINE SULFATE (PF) 2 MG/ML IV SOLN
1.0000 mg | INTRAVENOUS | Status: DC | PRN
  Administered 2021-10-04: 2 mg via INTRAVENOUS
  Filled 2021-10-04: qty 1

## 2021-10-04 MED ORDER — VANCOMYCIN HCL IN DEXTROSE 1-5 GM/200ML-% IV SOLN
1000.0000 mg | Freq: Once | INTRAVENOUS | Status: AC
  Administered 2021-10-04: 1000 mg via INTRAVENOUS
  Filled 2021-10-04: qty 200

## 2021-10-04 MED ORDER — LACTATED RINGERS IV SOLN: INTRAVENOUS | Status: DC | PRN

## 2021-10-04 MED ORDER — SODIUM CHLORIDE 0.9% FLUSH
3.0000 mL | Freq: Two times a day (BID) | INTRAVENOUS | Status: DC
  Administered 2021-10-05: 3 mL via INTRAVENOUS

## 2021-10-04 MED ORDER — MIDAZOLAM HCL (PF) 10 MG/2ML IJ SOLN
INTRAMUSCULAR | Status: AC
  Filled 2021-10-04: qty 2

## 2021-10-04 MED ORDER — SODIUM CHLORIDE (PF) 0.9 % IJ SOLN
OROMUCOSAL | Status: DC | PRN
  Administered 2021-10-04 (×5): 4 mL via TOPICAL

## 2021-10-04 MED ORDER — ACETAMINOPHEN 500 MG PO TABS
1000.0000 mg | ORAL_TABLET | Freq: Four times a day (QID) | ORAL | Status: AC
  Administered 2021-10-04 – 2021-10-09 (×19): 1000 mg via ORAL
  Filled 2021-10-04 (×20): qty 2

## 2021-10-04 MED ORDER — HEMOSTATIC AGENTS (NO CHARGE) OPTIME
TOPICAL | Status: DC | PRN
  Administered 2021-10-04 (×2): 1 via TOPICAL

## 2021-10-04 MED ORDER — HEMOSTATIC AGENTS (NO CHARGE) OPTIME
TOPICAL | Status: DC | PRN
  Administered 2021-10-04: 1 via TOPICAL

## 2021-10-04 MED ORDER — CHLORHEXIDINE GLUCONATE 0.12 % MT SOLN: 15.0000 mL | Freq: Once | OROMUCOSAL | Status: AC

## 2021-10-04 MED ORDER — METOPROLOL TARTRATE 12.5 MG HALF TABLET
12.5000 mg | ORAL_TABLET | Freq: Once | ORAL | Status: DC
  Filled 2021-10-04: qty 1

## 2021-10-04 MED ORDER — PROPOFOL 10 MG/ML IV BOLUS
INTRAVENOUS | Status: DC | PRN
Start: 2021-10-04 — End: 2021-10-04
  Administered 2021-10-04: 100 mg via INTRAVENOUS

## 2021-10-04 MED ORDER — CHLORHEXIDINE GLUCONATE 4 % EX LIQD: 30.0000 mL | CUTANEOUS | Status: DC

## 2021-10-04 MED ORDER — ORAL CARE MOUTH RINSE: 15.0000 mL | Freq: Once | OROMUCOSAL | Status: DC

## 2021-10-04 MED ORDER — DOCUSATE SODIUM 100 MG PO CAPS
200.0000 mg | ORAL_CAPSULE | Freq: Every day | ORAL | Status: DC
  Administered 2021-10-05 – 2021-10-10 (×6): 200 mg via ORAL
  Filled 2021-10-04 (×6): qty 2

## 2021-10-04 MED ORDER — LACTATED RINGERS IV SOLN: 500.0000 mL | Freq: Once | INTRAVENOUS | Status: DC | PRN

## 2021-10-04 MED ORDER — ONDANSETRON HCL 4 MG/2ML IJ SOLN
INTRAMUSCULAR | Status: DC | PRN
Start: 2021-10-04 — End: 2021-10-04
  Administered 2021-10-04: 4 mg via INTRAVENOUS

## 2021-10-04 MED ORDER — ALBUMIN HUMAN 5 % IV SOLN: INTRAVENOUS | Status: DC | PRN

## 2021-10-04 MED ORDER — DEXAMETHASONE SODIUM PHOSPHATE 10 MG/ML IJ SOLN
INTRAMUSCULAR | Status: DC | PRN
  Administered 2021-10-04: 5 mg via INTRAVENOUS

## 2021-10-04 MED ORDER — ONDANSETRON HCL 4 MG/2ML IJ SOLN
INTRAMUSCULAR | Status: AC
  Filled 2021-10-04: qty 2

## 2021-10-04 MED ORDER — ACETAMINOPHEN 160 MG/5ML PO SOLN: 1000.0000 mg | Freq: Four times a day (QID) | ORAL | Status: AC

## 2021-10-04 MED ORDER — DEXAMETHASONE SODIUM PHOSPHATE 10 MG/ML IJ SOLN
INTRAMUSCULAR | Status: AC
  Filled 2021-10-04: qty 1

## 2021-10-04 MED ORDER — SODIUM CHLORIDE 0.9 % IV SOLN: INTRAVENOUS | Status: DC

## 2021-10-04 MED ORDER — CHLORHEXIDINE GLUCONATE 0.12 % MT SOLN
15.0000 mL | OROMUCOSAL | Status: AC
  Administered 2021-10-04: 15 mL via OROMUCOSAL

## 2021-10-04 MED ORDER — MAGNESIUM SULFATE 4 GM/100ML IV SOLN
4.0000 g | Freq: Once | INTRAVENOUS | Status: AC
  Administered 2021-10-04: 4 g via INTRAVENOUS
  Filled 2021-10-04: qty 100

## 2021-10-04 MED ORDER — ROCURONIUM BROMIDE 10 MG/ML (PF) SYRINGE
PREFILLED_SYRINGE | INTRAVENOUS | Status: AC
  Filled 2021-10-04: qty 10

## 2021-10-04 MED ORDER — METOPROLOL TARTRATE 25 MG/10 ML ORAL SUSPENSION: 12.5000 mg | Freq: Two times a day (BID) | ORAL | Status: DC

## 2021-10-04 MED ORDER — CEFAZOLIN SODIUM-DEXTROSE 2-4 GM/100ML-% IV SOLN
2.0000 g | Freq: Three times a day (TID) | INTRAVENOUS | Status: AC
  Administered 2021-10-04 – 2021-10-06 (×6): 2 g via INTRAVENOUS
  Filled 2021-10-04 (×6): qty 100

## 2021-10-04 MED ORDER — SODIUM BICARBONATE 8.4 % IV SOLN
100.0000 meq | Freq: Once | INTRAVENOUS | Status: AC
  Administered 2021-10-04: 100 meq via INTRAVENOUS

## 2021-10-04 MED ORDER — MIDAZOLAM HCL (PF) 5 MG/ML IJ SOLN
INTRAMUSCULAR | Status: DC | PRN
  Administered 2021-10-04: 5 mg via INTRAVENOUS
  Administered 2021-10-04 (×2): 1 mg via INTRAVENOUS
  Administered 2021-10-04: 2 mg via INTRAVENOUS
  Administered 2021-10-04: 1 mg via INTRAVENOUS

## 2021-10-04 MED ORDER — PROPOFOL 10 MG/ML IV BOLUS
INTRAVENOUS | Status: AC
  Filled 2021-10-04: qty 20

## 2021-10-04 MED ORDER — METOPROLOL TARTRATE 12.5 MG HALF TABLET
12.5000 mg | ORAL_TABLET | Freq: Two times a day (BID) | ORAL | Status: DC
  Administered 2021-10-05 – 2021-10-08 (×6): 12.5 mg via ORAL
  Filled 2021-10-04 (×8): qty 1

## 2021-10-04 MED ORDER — POTASSIUM CHLORIDE 10 MEQ/50ML IV SOLN: 10.0000 meq | INTRAVENOUS | Status: AC

## 2021-10-04 MED ORDER — ALBUMIN HUMAN 5 % IV SOLN
250.0000 mL | INTRAVENOUS | Status: AC | PRN
  Administered 2021-10-04: 12.5 g via INTRAVENOUS

## 2021-10-04 MED ORDER — PANTOPRAZOLE SODIUM 40 MG PO TBEC
40.0000 mg | DELAYED_RELEASE_TABLET | Freq: Every day | ORAL | Status: DC
  Administered 2021-10-06 – 2021-10-10 (×5): 40 mg via ORAL
  Filled 2021-10-04 (×5): qty 1

## 2021-10-04 MED ORDER — BISACODYL 5 MG PO TBEC
10.0000 mg | DELAYED_RELEASE_TABLET | Freq: Every day | ORAL | Status: DC
  Administered 2021-10-05 – 2021-10-09 (×4): 10 mg via ORAL
  Filled 2021-10-04 (×5): qty 2

## 2021-10-04 MED ORDER — LACTATED RINGERS IV SOLN: INTRAVENOUS | Status: DC

## 2021-10-04 MED ORDER — 0.9 % SODIUM CHLORIDE (POUR BTL) OPTIME
TOPICAL | Status: DC | PRN
Start: 2021-10-04 — End: 2021-10-04
  Administered 2021-10-04: 6000 mL

## 2021-10-04 MED ORDER — ASPIRIN 81 MG PO CHEW: 324.0000 mg | CHEWABLE_TABLET | Freq: Every day | ORAL | Status: DC

## 2021-10-04 MED ORDER — FAMOTIDINE IN NACL 20-0.9 MG/50ML-% IV SOLN
20.0000 mg | Freq: Two times a day (BID) | INTRAVENOUS | Status: DC
  Administered 2021-10-04: 20 mg via INTRAVENOUS
  Filled 2021-10-04: qty 50

## 2021-10-04 MED ORDER — ACETAMINOPHEN 160 MG/5ML PO SOLN: 650.0000 mg | Freq: Once | ORAL | Status: AC

## 2021-10-04 MED ORDER — ARTIFICIAL TEARS OPHTHALMIC OINT
TOPICAL_OINTMENT | OPHTHALMIC | Status: DC | PRN
  Administered 2021-10-04: 1 via OPHTHALMIC

## 2021-10-04 MED ORDER — OXYCODONE HCL 5 MG PO TABS
5.0000 mg | ORAL_TABLET | ORAL | Status: DC | PRN
  Administered 2021-10-04 – 2021-10-10 (×10): 10 mg via ORAL
  Administered 2021-10-10: 5 mg via ORAL
  Filled 2021-10-04 (×8): qty 2
  Filled 2021-10-04: qty 1
  Filled 2021-10-04 (×2): qty 2

## 2021-10-04 MED ORDER — CHLORHEXIDINE GLUCONATE 0.12 % MT SOLN: 15.0000 mL | Freq: Once | OROMUCOSAL | Status: DC

## 2021-10-04 MED ORDER — SODIUM CHLORIDE 0.45 % IV SOLN: INTRAVENOUS | Status: DC | PRN

## 2021-10-04 MED ORDER — PROTAMINE SULFATE 10 MG/ML IV SOLN
INTRAVENOUS | Status: DC | PRN
  Administered 2021-10-04: 280 mg via INTRAVENOUS

## 2021-10-04 MED ORDER — CHLORHEXIDINE GLUCONATE 0.12 % MT SOLN
OROMUCOSAL | Status: AC
  Administered 2021-10-04: 15 mL via OROMUCOSAL
  Filled 2021-10-04: qty 15

## 2021-10-04 MED ORDER — SODIUM CHLORIDE 0.9% FLUSH
10.0000 mL | Freq: Two times a day (BID) | INTRAVENOUS | Status: DC
  Administered 2021-10-04 – 2021-10-05 (×2): 10 mL

## 2021-10-04 MED ORDER — NITROGLYCERIN IN D5W 200-5 MCG/ML-% IV SOLN: 0.0000 ug/min | INTRAVENOUS | Status: DC

## 2021-10-04 MED ORDER — PHENYLEPHRINE HCL-NACL 20-0.9 MG/250ML-% IV SOLN: 0.0000 ug/min | INTRAVENOUS | Status: DC

## 2021-10-04 MED ORDER — SODIUM CHLORIDE 0.9 % IV SOLN: 250.0000 mL | INTRAVENOUS | Status: DC

## 2021-10-04 MED ORDER — INSULIN REGULAR(HUMAN) IN NACL 100-0.9 UT/100ML-% IV SOLN: INTRAVENOUS | Status: DC

## 2021-10-04 SURGICAL SUPPLY — 109 items
ADAPTER CARDIO PERF ANTE/RETRO (ADAPTER) ×4 IMPLANT
APPLICATOR COTTON TIP 6 STRL (MISCELLANEOUS) IMPLANT
APPLICATOR COTTON TIP 6IN STRL (MISCELLANEOUS)
BAG DECANTER FOR FLEXI CONT (MISCELLANEOUS) ×4 IMPLANT
BLADE CLIPPER SURG (BLADE) ×4 IMPLANT
BLADE MINI RND TIP GREEN BEAV (BLADE) ×1 IMPLANT
BLADE NDL 3 SS STRL (BLADE) IMPLANT
BLADE NEEDLE 3 SS STRL (BLADE) ×4 IMPLANT
BLADE STERNUM SYSTEM 6 (BLADE) ×4 IMPLANT
BLADE SURG 10 STRL SS (BLADE) ×1 IMPLANT
BLADE SURG 15 STRL LF DISP TIS (BLADE) ×3 IMPLANT
BLADE SURG 15 STRL SS (BLADE) ×1
BNDG ELASTIC 4X5.8 VLCR STR LF (GAUZE/BANDAGES/DRESSINGS) ×1 IMPLANT
BNDG ELASTIC 6X5.8 VLCR STR LF (GAUZE/BANDAGES/DRESSINGS) ×1 IMPLANT
BNDG GAUZE ELAST 4 BULKY (GAUZE/BANDAGES/DRESSINGS) ×1 IMPLANT
CANISTER SUCT 3000ML PPV (MISCELLANEOUS) ×4 IMPLANT
CANNULA GUNDRY RCSP 15FR (MISCELLANEOUS) ×4 IMPLANT
CATH ROBINSON RED A/P 18FR (CATHETERS) ×8 IMPLANT
CAUTERY EYE LOW TEMP 1300F FIN (OPHTHALMIC RELATED) ×4 IMPLANT
CAUTERY SURG HI TEMP FINE TIP (MISCELLANEOUS) ×1 IMPLANT
CLIP FOGARTY SPRING 6M (CLIP) IMPLANT
CLIP TI MEDIUM 24 (CLIP) ×1 IMPLANT
CLIP TI WIDE RED SMALL 24 (CLIP) ×1 IMPLANT
CNTNR URN SCR LID CUP LEK RST (MISCELLANEOUS) IMPLANT
CONT SPEC 4OZ STRL OR WHT (MISCELLANEOUS) ×2
CONTAINER PROTECT SURGISLUSH (MISCELLANEOUS) ×5 IMPLANT
DERMABOND ADVANCED (GAUZE/BANDAGES/DRESSINGS) ×2
DERMABOND ADVANCED .7 DNX12 (GAUZE/BANDAGES/DRESSINGS) IMPLANT
DRAPE WARM FLUID 44X44 (DRAPES) IMPLANT
DRSG COVADERM 4X14 (GAUZE/BANDAGES/DRESSINGS) ×4 IMPLANT
ELECT REM PT RETURN 9FT ADLT (ELECTROSURGICAL) ×8
ELECTRODE REM PT RTRN 9FT ADLT (ELECTROSURGICAL) ×6 IMPLANT
FELT TEFLON 1X6 (MISCELLANEOUS) ×5 IMPLANT
GAUZE 4X4 16PLY ~~LOC~~+RFID DBL (SPONGE) ×4 IMPLANT
GAUZE SPONGE 4X4 12PLY STRL (GAUZE/BANDAGES/DRESSINGS) ×8 IMPLANT
GLOVE SURG GAMMEX LF SZ7 (GLOVE) ×4 IMPLANT
GLOVE SURG MICRO LTX SZ6 (GLOVE) ×1 IMPLANT
GLOVE SURG MICRO LTX SZ6.5 (GLOVE) ×5 IMPLANT
GLOVE SURG MICRO LTX SZ7 (GLOVE) ×1 IMPLANT
GLOVE SURG MICRO LTX SZ7.5 (GLOVE) ×2 IMPLANT
GLOVE SURG SIGNA 7.5 PF LTX (GLOVE) ×12 IMPLANT
GLOVE SURG SYN 7.5  E (GLOVE) ×1
GLOVE SURG SYN 7.5 E (GLOVE) ×3 IMPLANT
GLOVE SURG SYN 7.5 PF PI (GLOVE) IMPLANT
GOWN STRL REUS W/ TWL LRG LVL3 (GOWN DISPOSABLE) ×12 IMPLANT
GOWN STRL REUS W/TWL LRG LVL3 (GOWN DISPOSABLE) ×4
HEMOSTAT POWDER SURGIFOAM 1G (HEMOSTASIS) ×12 IMPLANT
HEMOSTAT SURGICEL 2X14 (HEMOSTASIS) ×1 IMPLANT
IV CATH 22GX1 FEP (IV SOLUTION) ×1 IMPLANT
KIT BASIN OR (CUSTOM PROCEDURE TRAY) ×4 IMPLANT
KIT SUCTION CATH 14FR (SUCTIONS) ×8 IMPLANT
KIT TURNOVER KIT B (KITS) ×4 IMPLANT
KIT VASOVIEW HEMOPRO 2 VH 4000 (KITS) ×1 IMPLANT
LINE VENT (MISCELLANEOUS) ×1 IMPLANT
MARKER GRAFT CORONARY BYPASS (MISCELLANEOUS) ×1 IMPLANT
NEEDLE AORTIC AIR ASPIRATING (NEEDLE) ×1 IMPLANT
NS IRRIG 1000ML POUR BTL (IV SOLUTION) ×18 IMPLANT
PACK OPEN HEART (CUSTOM PROCEDURE TRAY) ×4 IMPLANT
PAD ARMBOARD 7.5X6 YLW CONV (MISCELLANEOUS) ×8 IMPLANT
POSITIONER HEAD DONUT 9IN (MISCELLANEOUS) ×4 IMPLANT
SEALANT PATCH FIBRIN 2X4IN (MISCELLANEOUS) ×2 IMPLANT
SET MPS 3-ND DEL (MISCELLANEOUS) ×1 IMPLANT
SPONGE T-LAP 18X18 ~~LOC~~+RFID (SPONGE) ×16 IMPLANT
SPONGE T-LAP 4X18 ~~LOC~~+RFID (SPONGE) ×4 IMPLANT
SUT EB EXC GRN/WHT 2-0 V-5 (SUTURE) ×10 IMPLANT
SUT ETHIBOND 2 0 SH (SUTURE) ×2
SUT ETHIBOND 2 0 SH 36X2 (SUTURE) ×3 IMPLANT
SUT PROLENE 3 0 SH 1 (SUTURE) ×8 IMPLANT
SUT PROLENE 3 0 SH DA (SUTURE) ×4 IMPLANT
SUT PROLENE 4 0 RB 1 (SUTURE) ×6
SUT PROLENE 4 0 SH DA (SUTURE) ×4 IMPLANT
SUT PROLENE 4-0 RB1 .5 CRCL 36 (SUTURE) IMPLANT
SUT PROLENE 5 0 C 1 36 (SUTURE) ×9 IMPLANT
SUT PROLENE 6 0 C 1 30 (SUTURE) ×4 IMPLANT
SUT PROLENE 7 0 BV 1 (SUTURE) ×3 IMPLANT
SUT PROLENE 7 0 BV1 MDA (SUTURE) ×1 IMPLANT
SUT SILK  1 MH (SUTURE) ×2
SUT SILK 1 MH (SUTURE) ×6 IMPLANT
SUT SILK 1 TIES 10X30 (SUTURE) ×4 IMPLANT
SUT SILK 2 0 (SUTURE) ×1
SUT SILK 2 0 SH CR/8 (SUTURE) ×9 IMPLANT
SUT SILK 2-0 18XBRD TIE 12 (SUTURE) ×3 IMPLANT
SUT SILK 3 0 SH CR/8 (SUTURE) ×4 IMPLANT
SUT SILK 4 0 (SUTURE) ×1
SUT SILK 4-0 18XBRD TIE 12 (SUTURE) ×3 IMPLANT
SUT STEEL 6MS V (SUTURE) IMPLANT
SUT TEM PAC WIRE 2 0 SH (SUTURE) ×16 IMPLANT
SUT VIC AB 1 CTX 36 (SUTURE) ×2
SUT VIC AB 1 CTX36XBRD ANBCTR (SUTURE) ×6 IMPLANT
SUT VIC AB 2-0 CT1 27 (SUTURE) ×1
SUT VIC AB 2-0 CT1 TAPERPNT 27 (SUTURE) IMPLANT
SUT VIC AB 2-0 CTX 27 (SUTURE) ×8 IMPLANT
SUT VIC AB 3-0 SH 27 (SUTURE) ×1
SUT VIC AB 3-0 SH 27X BRD (SUTURE) IMPLANT
SUT VIC AB 3-0 X1 27 (SUTURE) ×9 IMPLANT
SYR 10ML KIT SKIN ADHESIVE (MISCELLANEOUS) ×1 IMPLANT
SYSTEM SAHARA CHEST DRAIN ATS (WOUND CARE) ×4 IMPLANT
TAPE CLOTH SURG 4X10 WHT LF (GAUZE/BANDAGES/DRESSINGS) ×1 IMPLANT
TAPE PAPER 2X10 WHT MICROPORE (GAUZE/BANDAGES/DRESSINGS) ×1 IMPLANT
TOWEL GREEN STERILE (TOWEL DISPOSABLE) ×4 IMPLANT
TOWEL GREEN STERILE FF (TOWEL DISPOSABLE) ×4 IMPLANT
TRAY FOLEY SLVR 14FR TEMP STAT (SET/KITS/TRAYS/PACK) ×4 IMPLANT
TUBE CONNECTING 12X1/4 (SUCTIONS) ×1 IMPLANT
TUBE SUCT INTRACARD DLP 20F (MISCELLANEOUS) ×1 IMPLANT
TUBING LAP HI FLOW INSUFFLATIO (TUBING) ×1 IMPLANT
UNDERPAD 30X36 HEAVY ABSORB (UNDERPADS AND DIAPERS) ×4 IMPLANT
VALVE AORTIC KONECT RESILIA 25 (Valve) ×1 IMPLANT
WATER STERILE IRR 1000ML POUR (IV SOLUTION) ×8 IMPLANT
YANKAUER SUCT BULB TIP NO VENT (SUCTIONS) ×1 IMPLANT

## 2021-10-04 NOTE — Anesthesia Procedure Notes (Signed)
Procedure Name: Intubation Date/Time: 10/04/2021 8:45 AM Performed by: Leonor Liv, CRNA Pre-anesthesia Checklist: Patient identified, Emergency Drugs available, Suction available and Patient being monitored Patient Re-evaluated:Patient Re-evaluated prior to induction Oxygen Delivery Method: Circle System Utilized Preoxygenation: Pre-oxygenation with 100% oxygen Induction Type: IV induction Ventilation: Mask ventilation without difficulty Laryngoscope Size: Mac and 4 Grade View: Grade I Tube type: Oral Tube size: 8.0 mm Number of attempts: 1 Airway Equipment and Method: Stylet and Oral airway Placement Confirmation: ETT inserted through vocal cords under direct vision, positive ETCO2 and breath sounds checked- equal and bilateral Secured at: 23 cm Tube secured with: Tape Dental Injury: Teeth and Oropharynx as per pre-operative assessment

## 2021-10-04 NOTE — Hospital Course (Addendum)
HPI:  Patient is [name] with a History of Hypertension, Hyperlipidemia, DVT, PVT aortic insufficiency, ascending aneurysm, sleep apnea, arthritis, BPH, back pain and depression who was seen in cardiothoracic surgical consultation by Dr. Roxan Hockey.  He presented with progressive dyspnea and has had some presyncopal episodes.  An echocardiogram revealed severe aortic insufficiency with preserved left ventricular systolic function.  There is also a dilated aortic root.  Dr. Roxan Hockey evaluated the patient and studies and recommended aortic valve replacement.  A CT angio was also performed to better delineate the anatomy at the aortic root and ascending aorta.  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  It appeared to taper to normal prior to the takeoff of the innominate artery and there is no arch aneurysm.  Dr. Roxan Hockey recommended aortic valve replacement with biologic Bentall procedure.  He was admitted to the hospital electively for the procedure.  Hospital course  The patient was admitted electively and taken to the operating on 10/04/2021 at which time he underwent a Bentall procedure with a 25 mm KONNECT Resilia aortic valved conduit.  Intraoperatively it was noted that the right coronary artery ostium was severely calcified LAD and not a candidate for surgical reimplantation.  Therefore a saphenous vein graft to the RCA bypass was placed.  He tolerated procedure well was taken to the surgical intensive care unit in stable condition. Gordy Councilman and a line were removed on post op day one. Chest tubes and foley the following day. He maintained sinus rhythm and was on no pressor. He was transitioned off the Insulin drip. His pre op HGA1c was 5.4. Accu checks and SS PRN will be stopped upon transfer. He had expected post op blood loss anemia. He was volume overloaded and diuresed accordingly.  He developed mild acute kidney  injury with creatinine rising to 2.0 on postop day 2.  By the following day, it had improved to 1.5.  Urine output remains satisfactory.  On postop day 3, he was transferred to 4E Progressive Care.  On the following morning he developed atrial fibrillation with controlled ventricular rate and was loaded with IV amiodarone.  His pacer wires were removed uneventfully.  On postop day 4, he was started back on his Xarelto for his history of DVT and pulmonary embolus.  The aspirin dose was decreased to 81 mg p.o. daily.  Lovenox was discontinued.  Pulmonary hygiene and ambulation were encouraged.  He was weaned from supplemental oxygen without difficulty. Sternal and right lower extremity wounds are clean, dry, and healing without signs of infection. He was in sinus rhythm so he was transitioned from IV Amiodarone to oral on 02/13. He has been tolerating a diet and has had a bowel movement. He was put on Keflex for right forearm phlebitis (IV Amiodarone infused). He continued to maintain sinus rhythm and Amiodarone was titrated accordingly. As discussed with Dr. Roxan Hockey, he is felt surgically stable for discharge today.

## 2021-10-04 NOTE — Anesthesia Postprocedure Evaluation (Signed)
Anesthesia Post Note  Patient: Adrian Franco  Procedure(s) Performed: BENTALL PROCEDURE USING 25 MM KONNECT RESILIA  AORTIC VALVED CONDUIT (Chest) TRANSESOPHAGEAL ECHOCARDIOGRAM (TEE) ENDOVEIN HARVEST OF GREATER SAPHENOUS VEIN (Right: Leg Lower) CORONARY ARTERY BYPASS GRAFTING (CABG) TIMES ONE USING RIGHT GREATER SAPHENOUS VEIN (Chest)     Patient location during evaluation: SICU Anesthesia Type: General Level of consciousness: sedated Pain management: pain level controlled Vital Signs Assessment: post-procedure vital signs reviewed and stable Respiratory status: patient remains intubated per anesthesia plan Cardiovascular status: stable Postop Assessment: no apparent nausea or vomiting Anesthetic complications: no   No notable events documented.  Last Vitals:  Vitals:   10/04/21 1500 10/04/21 1515  BP: 107/85 95/75  Pulse: 80 80  Resp: 14 14  Temp: 36.8 C 36.8 C  SpO2: 99% 98%    Last Pain:  Vitals:   10/04/21 1515  TempSrc: Core (Comment)                 Jenille Laszlo,Ardean DANIEL

## 2021-10-04 NOTE — Anesthesia Procedure Notes (Signed)
Central Venous Catheter Insertion Performed by: Duane Boston, MD, anesthesiologist Start/End2/03/2022 7:51 AM, 10/04/2021 8:01 AM Patient location: Pre-op. Preanesthetic checklist: patient identified, IV checked, site marked, risks and benefits discussed, surgical consent, monitors and equipment checked, pre-op evaluation, timeout performed and anesthesia consent Position: Trendelenburg Lidocaine 1% used for infiltration and patient sedated Hand hygiene performed , maximum sterile barriers used  and Seldinger technique used Catheter size: 8.5 Fr Total catheter length 8. PA cath was placed.Sheath introducer Swan type:thermodilution PA Cath depth:50 Procedure performed using ultrasound guided technique. Ultrasound Notes:anatomy identified, needle tip was noted to be adjacent to the nerve/plexus identified, no ultrasound evidence of intravascular and/or intraneural injection and image(s) printed for medical record Attempts: 1 Following insertion, line sutured and dressing applied. Post procedure assessment: free fluid flow, blood return through all ports and no air  Patient tolerated the procedure well with no immediate complications.

## 2021-10-04 NOTE — Procedures (Signed)
Extubation Procedure Note  Patient Details:   Name: Adrian Franco DOB: [birth year] MRN: [id number]   Airway Documentation:    Vent end date: 10/04/21 Vent end time: 1813   Evaluation  O2 sats: stable throughout Complications: No apparent complications Patient did tolerate procedure well. Bilateral Breath Sounds: Clear, Diminished   Yes, pt could speak post extubation.  Earney Navy 10/04/2021, 6:15 PM

## 2021-10-04 NOTE — Progress Notes (Signed)
Patient ID: Adrian Franco, male   DOB: 07-Dec-1945, 76 y.o.   MRN: [id number]  TCTS Evening Rounds:   Hemodynamically stable. Atrial paced 80. CI = 1.9  Has started to wake up on vent.   Urine output good  CT output low  CBC    Component Value Date/Time   WBC 10.2 10/04/2021 1357   RBC 4.10 (L) 10/04/2021 1357   HGB 13.3 10/04/2021 1513   HGB 14.7 08/29/2021 1447   HCT 39.0 10/04/2021 1513   HCT 44.4 08/29/2021 1447   PLT 131 (L) 10/04/2021 1357   PLT 210 08/29/2021 1447   MCV 91.2 10/04/2021 1357   MCV 89 08/29/2021 1447   MCH 31.0 10/04/2021 1357   MCHC 34.0 10/04/2021 1357   RDW 12.1 10/04/2021 1357   RDW 12.8 08/29/2021 1447   LYMPHSABS 1.3 06/08/2020 1315   MONOABS 0.4 06/08/2020 1315   EOSABS 0.3 06/08/2020 1315   BASOSABS 0.1 06/08/2020 1315     BMET    Component Value Date/Time   NA 138 10/04/2021 1513   NA 144 08/29/2021 1447   K 4.6 10/04/2021 1513   CL 103 10/04/2021 1235   CO2 24 10/02/2021 1433   GLUCOSE 164 (H) 10/04/2021 1235   BUN 20 10/04/2021 1235   BUN 14 08/29/2021 1447   CREATININE 0.90 10/04/2021 1235   CALCIUM 9.7 10/02/2021 1433   EGFR 67 08/29/2021 1447   GFRNONAA >60 10/02/2021 1433     A/P:  Stable postop course. Continue current plans. Wean vent as tolerated.

## 2021-10-04 NOTE — Anesthesia Procedure Notes (Signed)
Arterial Line Insertion Start/End2/03/2022 8:01 AM, 10/04/2021 8:08 AM Performed by: Duane Boston, MD  Patient location: Pre-op. Preanesthetic checklist: patient identified, IV checked, site marked, risks and benefits discussed, surgical consent, monitors and equipment checked, pre-op evaluation, timeout performed and anesthesia consent Lidocaine 1% used for infiltration brachial was placed Catheter size: 20 Fr Hand hygiene performed  and maximum sterile barriers used   Attempts: 1 Procedure performed using ultrasound guided technique. Ultrasound Notes:anatomy identified, no ultrasound evidence of intravascular and/or intraneural injection and image(s) printed for medical record Following insertion, dressing applied and Biopatch. Post procedure assessment: normal and unchanged  Patient tolerated the procedure well with no immediate complications.

## 2021-10-04 NOTE — Transfer of Care (Addendum)
Immediate Anesthesia Transfer of Care Note  Patient: Adrian Franco  Procedure(s) Performed: BENTALL PROCEDURE USING 25 MM KONNECT RESILIA  AORTIC VALVED CONDUIT (Chest) TRANSESOPHAGEAL ECHOCARDIOGRAM (TEE) ENDOVEIN HARVEST OF GREATER SAPHENOUS VEIN (Right: Leg Lower) CORONARY ARTERY BYPASS GRAFTING (CABG) TIMES ONE USING RIGHT GREATER SAPHENOUS VEIN (Chest)  Patient Location: ICU    Anesthesia Type:General  Level of Consciousness: sedated and Patient remains intubated per anesthesia plan  Airway & Oxygen Therapy: Patient remains intubated per anesthesia plan  Post-op Assessment: Report given to RN and Post -op Vital signs reviewed and stable  Post vital signs: Reviewed and stable  Last Vitals:  Vitals Value Taken Time  BP 100/65   Temp 36.7 C 10/04/21 1357  Pulse 80 10/04/21 1357  Resp 13 10/04/21 1357  SpO2 97 % 10/04/21 1357  Vitals shown include unvalidated device data.  Last Pain:  Vitals:   10/04/21 0709  TempSrc: Oral         Complications: No notable events documented.

## 2021-10-04 NOTE — Brief Op Note (Signed)
10/04/2021  5:21 PM  PATIENT:  Adrian Franco  76 y.o. male  PRE-OPERATIVE DIAGNOSIS:  AORTIC INSUFFICIENCY  ASCENDING AORTIC ANEURYSM  POST-OPERATIVE DIAGNOSIS:  AORTIC INSUFFICIENCY  ASCENDING AORTIC ANEURYSM  PROCEDURE:  Procedure(s): BENTALL PROCEDURE USING 25 MM KONNECT RESILIA  AORTIC VALVED CONDUIT (N/A) TRANSESOPHAGEAL ECHOCARDIOGRAM (TEE) (N/A) ENDOVEIN HARVEST OF GREATER SAPHENOUS VEIN (Right) CORONARY ARTERY BYPASS GRAFTING (CABG) TIMES ONE USING RIGHT GREATER SAPHENOUS VEIN (N/A) SVG-RCA EVH 45/15 MIN  SURGEON:  Surgeon(s) and Role:    * Melrose Nakayama, MD - Primary  PHYSICIAN ASSISTANT: WAYNE GOLD PA-C, MYRON RODDENNBERRY PA-C  ASSISTANTS: STAFF   ANESTHESIA:   general  EBL:  1200 mL   BLOOD ADMINISTERED:none  DRAINS:  MEDIASTINAL CHEST TUBES    LOCAL MEDICATIONS USED:  NONE  SPECIMEN:  Source of Specimen:  AORTIC ANEURYSM AND VALVE LEAFLETS  DISPOSITION OF SPECIMEN:  PATHOLOGY  COUNTS:  YES  TOURNIQUET:  * No tourniquets in log *  DICTATION: .Other Dictation: Dictation Number PENDING  PLAN OF CARE: Admit to inpatient   PATIENT DISPOSITION:  ICU - intubated and hemodynamically stable.   Delay start of Pharmacological VTE agent (>24hrs) due to surgical blood loss or risk of bleeding: yes  COMPLICATIONS: NO KNOWN

## 2021-10-04 NOTE — Interval H&P Note (Signed)
History and Physical Interval Note:  10/04/2021 7:53 AM  Adrian Franco  has presented today for surgery, with the diagnosis of AI TAA.  The various methods of treatment have been discussed with the patient and family. After consideration of risks, benefits and other options for treatment, the patient has consented to  Procedure(s): BENTALL PROCEDURE (N/A) TRANSESOPHAGEAL ECHOCARDIOGRAM (TEE) (N/A) as a surgical intervention.  The patient's history has been reviewed, patient examined, no change in status, stable for surgery.  I have reviewed the patient's chart and labs.  Questions were answered to the patient's satisfaction.     Melrose Nakayama

## 2021-10-04 NOTE — Progress Notes (Signed)
°  Transition of Care [facility]) Screening Note   Patient Details  Name: Adrian Franco Date of Birth: Jan 09, 1946   Transition of Care [facility]) CM/SW Contact:    Milas Gain, [place] Phone Number: 10/04/2021, 4:00 PM    Transition of Care Department [facility]) has reviewed patient and no TOC needs have been identified at this time. We will continue to monitor patient advancement through interdisciplinary progression rounds. If new patient transition needs arise, please place a TOC consult.

## 2021-10-05 ENCOUNTER — Other Ambulatory Visit: Payer: Self-pay | Admitting: Cardiology

## 2021-10-05 ENCOUNTER — Inpatient Hospital Stay (HOSPITAL_COMMUNITY): Payer: Medicare Other

## 2021-10-05 ENCOUNTER — Encounter (HOSPITAL_COMMUNITY): Payer: Self-pay | Admitting: Thoracic Surgery (Cardiothoracic Vascular Surgery)

## 2021-10-05 DIAGNOSIS — I350 Nonrheumatic aortic (valve) stenosis: Secondary | ICD-10-CM

## 2021-10-05 LAB — GLUCOSE, CAPILLARY
Glucose-Capillary: 114 mg/dL — ABNORMAL HIGH (ref 70–99)
Glucose-Capillary: 125 mg/dL — ABNORMAL HIGH (ref 70–99)
Glucose-Capillary: 127 mg/dL — ABNORMAL HIGH (ref 70–99)
Glucose-Capillary: 131 mg/dL — ABNORMAL HIGH (ref 70–99)
Glucose-Capillary: 133 mg/dL — ABNORMAL HIGH (ref 70–99)
Glucose-Capillary: 136 mg/dL — ABNORMAL HIGH (ref 70–99)
Glucose-Capillary: 144 mg/dL — ABNORMAL HIGH (ref 70–99)
Glucose-Capillary: 157 mg/dL — ABNORMAL HIGH (ref 70–99)

## 2021-10-05 LAB — BASIC METABOLIC PANEL
Anion gap: 11 (ref 5–15)
Anion gap: 10 (ref 5–15)
BUN: 19 mg/dL (ref 8–23)
BUN: 28 mg/dL — ABNORMAL HIGH (ref 8–23)
CO2: 22 mmol/L (ref 22–32)
CO2: 23 mmol/L (ref 22–32)
Calcium: 7.6 mg/dL — ABNORMAL LOW (ref 8.9–10.3)
Calcium: 8 mg/dL — ABNORMAL LOW (ref 8.9–10.3)
Chloride: 102 mmol/L (ref 98–111)
Chloride: 100 mmol/L (ref 98–111)
Creatinine, Ser: 1.18 mg/dL (ref 0.61–1.24)
Creatinine, Ser: 2.04 mg/dL — ABNORMAL HIGH (ref 0.61–1.24)
GFR, Estimated: >60 mL/min (ref >60)
GFR, Estimated: 33 mL/min — ABNORMAL LOW (ref >60)
Glucose, Bld: 129 mg/dL — ABNORMAL HIGH (ref 70–99)
Glucose, Bld: 173 mg/dL — ABNORMAL HIGH (ref 70–99)
Potassium: 4.6 mmol/L (ref 3.5–5.1)
Potassium: 4.4 mmol/L (ref 3.5–5.1)
Sodium: 135 mmol/L (ref 135–145)
Sodium: 133 mmol/L — ABNORMAL LOW (ref 135–145)

## 2021-10-05 LAB — CBC
HCT: 38.4 % — ABNORMAL LOW (ref 39.0–52.0)
HCT: 35.4 % — ABNORMAL LOW (ref 39.0–52.0)
Hemoglobin: 12.6 g/dL — ABNORMAL LOW (ref 13.0–17.0)
Hemoglobin: 11.4 g/dL — ABNORMAL LOW (ref 13.0–17.0)
MCH: 30.2 pg (ref 26.0–34.0)
MCH: 31.1 pg (ref 26.0–34.0)
MCHC: 32.8 g/dL (ref 30.0–36.0)
MCHC: 32.2 g/dL (ref 30.0–36.0)
MCV: 92.1 fL (ref 80.0–100.0)
MCV: 96.7 fL (ref 80.0–100.0)
Platelets: 153 10*3/uL (ref 150–400)
Platelets: 125 10*3/uL — ABNORMAL LOW (ref 150–400)
RBC: 4.17 MIL/uL — ABNORMAL LOW (ref 4.22–5.81)
RBC: 3.66 MIL/uL — ABNORMAL LOW (ref 4.22–5.81)
RDW: 12.6 % (ref 11.5–15.5)
RDW: 12.6 % (ref 11.5–15.5)
WBC: 15.9 10*3/uL — ABNORMAL HIGH (ref 4.0–10.5)
WBC: 16.2 10*3/uL — ABNORMAL HIGH (ref 4.0–10.5)
nRBC: 0 % (ref 0.0–0.2)
nRBC: 0 % (ref 0.0–0.2)

## 2021-10-05 LAB — SURGICAL PATHOLOGY

## 2021-10-05 LAB — MAGNESIUM
Magnesium: 2.3 mg/dL (ref 1.7–2.4)
Magnesium: 2.6 mg/dL — ABNORMAL HIGH (ref 1.7–2.4)

## 2021-10-05 MED ORDER — PREGABALIN 25 MG PO CAPS
50.0000 mg | ORAL_CAPSULE | Freq: Two times a day (BID) | ORAL | Status: DC
  Administered 2021-10-05 – 2021-10-10 (×11): 50 mg via ORAL
  Filled 2021-10-05: qty 2
  Filled 2021-10-05 (×2): qty 1
  Filled 2021-10-05 (×2): qty 2
  Filled 2021-10-05: qty 1
  Filled 2021-10-05 (×5): qty 2

## 2021-10-05 MED ORDER — INSULIN ASPART 100 UNIT/ML IJ SOLN
0.0000 [IU] | INTRAMUSCULAR | Status: DC
  Administered 2021-10-05 – 2021-10-06 (×3): 2 [IU] via SUBCUTANEOUS

## 2021-10-05 MED ORDER — SIMVASTATIN 20 MG PO TABS
40.0000 mg | ORAL_TABLET | Freq: Every day | ORAL | Status: DC
  Administered 2021-10-05 – 2021-10-08 (×4): 40 mg via ORAL
  Filled 2021-10-05 (×4): qty 2

## 2021-10-05 MED ORDER — ENOXAPARIN SODIUM 40 MG/0.4ML IJ SOSY
40.0000 mg | PREFILLED_SYRINGE | Freq: Every day | INTRAMUSCULAR | Status: DC
  Administered 2021-10-05 – 2021-10-07 (×3): 40 mg via SUBCUTANEOUS
  Filled 2021-10-05 (×3): qty 0.4

## 2021-10-05 MED ORDER — TAMSULOSIN HCL 0.4 MG PO CAPS
0.4000 mg | ORAL_CAPSULE | Freq: Two times a day (BID) | ORAL | Status: DC
  Administered 2021-10-05 – 2021-10-10 (×11): 0.4 mg via ORAL
  Filled 2021-10-05 (×11): qty 1

## 2021-10-05 MED ORDER — MAGNESIUM OXIDE -MG SUPPLEMENT 400 (240 MG) MG PO TABS
400.0000 mg | ORAL_TABLET | Freq: Every day | ORAL | Status: DC
  Administered 2021-10-05 – 2021-10-10 (×6): 400 mg via ORAL
  Filled 2021-10-05 (×6): qty 1

## 2021-10-05 MED ORDER — INSULIN DETEMIR 100 UNIT/ML ~~LOC~~ SOLN
12.0000 [IU] | Freq: Every day | SUBCUTANEOUS | Status: DC
  Administered 2021-10-05: 12 [IU] via SUBCUTANEOUS
  Filled 2021-10-05 (×2): qty 0.12

## 2021-10-05 MED ORDER — LORAZEPAM 1 MG PO TABS
0.5000 mg | ORAL_TABLET | Freq: Every day | ORAL | Status: DC
  Administered 2021-10-05 – 2021-10-09 (×5): 0.5 mg via ORAL
  Filled 2021-10-05 (×5): qty 1

## 2021-10-05 MED ORDER — ROPINIROLE HCL 1 MG PO TABS
4.0000 mg | ORAL_TABLET | Freq: Every day | ORAL | Status: DC
  Administered 2021-10-05 – 2021-10-09 (×5): 4 mg via ORAL
  Filled 2021-10-05 (×6): qty 4

## 2021-10-05 MED ORDER — FUROSEMIDE 10 MG/ML IJ SOLN
40.0000 mg | Freq: Once | INTRAMUSCULAR | Status: AC
  Administered 2021-10-05: 40 mg via INTRAVENOUS
  Filled 2021-10-05: qty 4

## 2021-10-05 MED ORDER — TIZANIDINE HCL 4 MG PO TABS
2.0000 mg | ORAL_TABLET | Freq: Four times a day (QID) | ORAL | Status: DC | PRN
  Administered 2021-10-09: 2 mg via ORAL
  Filled 2021-10-05: qty 1

## 2021-10-05 MED ORDER — ROPINIROLE HCL 1 MG PO TABS
4.0000 mg | ORAL_TABLET | Freq: Every day | ORAL | Status: DC
  Filled 2021-10-05: qty 4

## 2021-10-05 MED ORDER — ALBUMIN HUMAN 5 % IV SOLN
12.5000 g | Freq: Once | INTRAVENOUS | Status: AC
  Administered 2021-10-05: 12.5 g via INTRAVENOUS
  Filled 2021-10-05: qty 250

## 2021-10-05 MED FILL — Lidocaine HCl Local Preservative Free (PF) Inj 2%: INTRAMUSCULAR | Qty: 15 | Status: AC

## 2021-10-05 MED FILL — Heparin Sodium (Porcine) Inj 1000 Unit/ML: Qty: 1000 | Status: AC

## 2021-10-05 MED FILL — Potassium Chloride Inj 2 mEq/ML: INTRAVENOUS | Qty: 40 | Status: AC

## 2021-10-05 NOTE — Op Note (Signed)
NAME: Adrian Franco, Adrian Franco MEDICAL RECORD NO: [id number] ACCOUNT NO: [id number] DATE OF BIRTH: 1945/09/10 FACILITY: [facility] LOCATION: [facility] PHYSICIAN: Revonda Standard. 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  PROCEDURE PERFORMED:   Median sternotomy, extracorporeal circulation,  Biologic Bentall procedure using 25 mm Konect Resilia aortic valve conduit,  Coronary bypass grafting x1 with saphenous vein graft to right coronary artery,  Endoscopic vein harvest right thigh.  SURGEON:  Dr. Remo Lipps C. Ethne Jeon.  ASSISTANT: Jadene Pierini, [place] and [place], [place]   ANESTHESIA:  General.  FINDINGS:  Transesophageal echocardiography showed severe aortic insufficiency with preserved left ventricular systolic function.  Good function of the prosthetic  valve post-bypass.  Normal left main coronary ostia.  Severely calcified tightly stenotic origin of the right coronary artery, necessitating bypass.   Saphenous vein good quality.  Distal right coronary good quality target.  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  He presented with a decreased energy, dyspnea and dizzy spells, which were progressive in frequency and severity.  Evaluation  showed worsening of his severe aortic insufficiency.  Catheterization revealed no significant coronary artery disease.  He was advised to undergo aortic root replacement.  Given his age, it was felt that a biologic prosthesis would be the best option and he agreed to that.  The indications, risks, benefits, and alternatives were discussed in detail with the patient.  He understood and accepted the risks and agreed to proceed.  OPERATIVE NOTE:  Adrian Franco was  brought to the preoperative holding area on 10/04/2021.  Dr. Tobias Alexander of the anesthesia service placed a Swan-Ganz catheter and an arterial blood pressure monitoring line was placed.  He was taken to the operating room and  anesthetized and intubated.  Foley catheter was placed.  Intravenous antibiotics were administered.  Dr. Tobias Alexander performed transesophageal echocardiography.  Please refer to his separately dictated note for full details of the procedure.  It did confirm  severe aortic insufficiency and preserved left ventricular systolic function.  The chest, abdomen and legs were prepped and draped in the usual sterile fashion.  A timeout was performed.  Median sternotomy was performed.  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  Hemostasis was achieved.  The patient was fully heparinized.  Sternal retractor was placed and was opened over time.  The pericardium was opened.  The  ascending aorta was enlarged, approximately 3.9-4 cm in diameter prior to the takeoff of the innominate artery.  After confirming adequate anticoagulation with ACT measurement, the aorta was cannulated via concentric 2-0 Ethibond pledgeted pursestring  sutures.  A dual stage venous cannula was placed via a pursestring suture in the right atrial appendage.  Cardiopulmonary bypass was initiated.  Flows were maintained per protocol.  The patient was cooled to 32 degrees Celsius.  A left ventricular vent  was placed via a pursestring suture in the right superior pulmonary vein.  Retrograde cardioplegic cannula was placed via a pursestring suture in the right atrium and directed into the coronary sinus.  An antegrade cardioplegia cannula was placed in the  ascending aorta.  The aorta was crossclamped and the left ventricle was emptied via the vent.  Cardiac arrest then was achieved with a combination of cold antegrade and retrograde KBC blood cardioplegia and topical iced  saline. Initially the aorta was slow to distend due to  the aortic  insufficiency, but ultimately with antegrade administration of 750 mL of cardioplegia, there was a diastolic arrest.  An additional 750 mL of cardioplegia was administered retrograde and there was septal cooling to 10 degrees Celsius.   Additional cardioplegia dose was administered at 60 minutes of crossclamp time.  The aorta was opened just above the sinotubular junction.  Inspection of the left main ostium revealed it to be in a normal location and widely patent.  Inspection of the valve leaflets showed partial fusion of the commissure between the left and the  right cusps, but with a normal 120-degree distribution of the commissures.  The valve leaflets were excised.  The right coronary ostia was inspected and there was surprisingly a large amount of calcium and a very small ostium.  Initially, it was unclear  if this was the actual coronary ostium, but it was definitely established.  Because of this, there was a need to bypass the right coronary, Enid Cutter was called to the room and harvested the greater saphenous vein from the right thigh  endoscopically while the procedure continued.  The annulus sized for a 25 mm Inspiris Resilia valve.  A 25 mm Konect Resilia valve conduit was prepared per manufacturer's instructions. 2-0 Ethibond horizontal mattress sutures were placed circumferentially  around the annulus.  A total of 17 sutures were utilized.  The sutures then were placed through the sewing ring of the valve conduit, which was lowered into place and the sutures were sequentially tied.  The valve seated nicely.  Thermal cautery was used  to create a hole in the graft for placement of the left coronary button.  The button was trimmed and anastomosed end-to-side to the graft with a running 6-0 Prolene suture.  BioGlue was placed around the anastomosis at its completion. The right coronary  button was not appropriate for trying to anastomose the graft due to the heavy calcification and was  ligated later in the procedure.  The graft then was cut to length on an angle to match the size of the distal ascending aorta.  The distal  anastomosis was performed end-to-end with a running 4-0 Prolene suture.  A Teflon felt pledget was used to reinforce the aorta.  By this point, the saphenous vein had been prepared.  The heart was retracted upward and the vein was anastomosed end-to-side  to the distal right coronary.  The distal right coronary artery was a 2 mm good quality target vessel and the vein was of good quality as well.  The vein graft then was cut to length and the thermal cautery was used to create a hole in the graft and the  proximal anastomosis for the vein graft was performed with a running 6-0 Prolene suture.  A McGoon needle was placed into the graft for de-airing purposes.  The patient was placed in Trendelenburg  position.  A warm dose of reanimation dose of cardioplegia was administered retrograde.  De-airing was performed.  The crossclamp was removed.  Total crossclamp time was 101 minutes.  The patient spontaneously resumed sinus rhythm and did not require defibrillation.  While  rewarming was completed, the anastomoses were inspected for hemostasis.  Epicardial pacing wires were placed on the right ventricle and right atrium.  When the patient had rewarmed to a core temperature of 37 degrees Celsius, he was weaned from  cardiopulmonary bypass on the first attempt.  Total bypass time was 145  minutes.  He did not require inotropic support, but he was paced for rate.  A test dose of protamine was administered and was well tolerated.  The atrial and aortic cannula were  removed.  The remainder of the protamine was administered without incident.  The chest was irrigated with warm saline.  Hemostasis was achieved.  The pericardium was reapproximated over the aorta and base of the heart with interrupted 3-0 silk sutures,  two mediastinal chest tubes were placed.  The sternum was closed  with a combination of single and double heavy gauge stainless steel wires.  Pectoralis fascia, subcutaneous tissue and skin were closed in standard fashion.  All sponge, needle and  instrument counts were correct at the end of the procedure, the patient was taken from the operating room to the surgical intensive care unit, intubated and in good condition.  Experienced assistance was necessary for this case due to complexity.  Jadene Pierini provided retraction of delicate tissues, exposure, suctioning and suture management while Myron Roddenberry harvested the saphenous vein and performed leg wound closure independently.     SUJ D: 10/04/2021 5:37:56 pm T: 10/05/2021 5:08:00 am  JOB: [id number]/ [id number]

## 2021-10-05 NOTE — Plan of Care (Signed)
°  Problem: Activity: Goal: Risk for activity intolerance will decrease Outcome: Progressing   Problem: Cardiac: Goal: Will achieve and/or maintain hemodynamic stability Outcome: Progressing   Problem: Clinical Measurements: Goal: Postoperative complications will be avoided or minimized Outcome: Progressing

## 2021-10-05 NOTE — Discharge Instructions (Signed)

## 2021-10-05 NOTE — Progress Notes (Signed)
EVENING ROUNDS NOTE :     [address]       [place] [address]             [phone]                 1 Day Post-Op Procedure(s) (LRB): BENTALL PROCEDURE USING 25 MM KONNECT RESILIA  AORTIC VALVED CONDUIT (N/A) TRANSESOPHAGEAL ECHOCARDIOGRAM (TEE) (N/A) ENDOVEIN HARVEST OF GREATER SAPHENOUS VEIN (Right) CORONARY ARTERY BYPASS GRAFTING (CABG) TIMES ONE USING RIGHT GREATER SAPHENOUS VEIN (N/A)   Total Length of Stay:  LOS: 1 day  Events:   No events Resting comfortably    BP 105/77    Pulse 76    Temp 100 F (37.8 C) (Core)    Resp (!) 24    Ht 5\' 9"  (1.753 m)    Wt 92.1 kg    SpO2 98%    BMI 29.98 kg/m   PAP: (24-46)/(9-23) 35/20 CO:  [4.2 L/min-5 L/min] 4.2 L/min CI:  [2.1 L/min/m2-2.5 L/min/m2] 2.1 L/min/m2      sodium chloride 20 mL/hr at 10/05/21 0900   sodium chloride     sodium chloride 10 mL/hr at 10/04/21 1444    ceFAZolin (ANCEF) IV 2 g (10/05/21 1553)   insulin Stopped (10/05/21 0913)   lactated ringers     lactated ringers 20 mL/hr at 10/05/21 0900   lactated ringers      I/O last 3 completed shifts: In: 4214.7 [I.V.:2534.9; Blood:800; IV Piggyback:879.8] Out: 5625 [Urine:2370; Blood:1200; Chest Tube:650]   CBC Latest Ref Rng & Units 10/05/2021 10/05/2021 10/04/2021  WBC 4.0 - 10.5 K/uL 16.2(H) 15.9(H) 13.5(H)  Hemoglobin 13.0 - 17.0 g/dL 11.4(L) 12.6(L) 13.1  Hematocrit 39.0 - 52.0 % 35.4(L) 38.4(L) 38.7(L)  Platelets 150 - 400 K/uL 125(L) 153 154    BMP Latest Ref Rng & Units 10/05/2021 10/04/2021 10/04/2021  Glucose 70 - 99 mg/dL 129(H) 143(H) -  BUN 8 - 23 mg/dL 19 17 -  Creatinine 0.61 - 1.24 mg/dL 1.18 1.17 -  BUN/Creat Ratio 10 - 24 - - -  Sodium 135 - 145 mmol/L 135 136 138  Potassium 3.5 - 5.1 mmol/L 4.6 4.8 4.9  Chloride 98 - 111 mmol/L 102 105 -  CO2 22 - 32 mmol/L 22 21(L) -  Calcium 8.9 - 10.3 mg/dL 7.6(L) 7.4(L) -    ABG    Component Value Date/Time   PHART 7.332 (L) 10/04/2021 1910   PCO2ART 45.5 10/04/2021 1910    PO2ART 81 (L) 10/04/2021 1910   HCO3 23.9 10/04/2021 1910   TCO2 25 10/04/2021 1910   ACIDBASEDEF 2.0 10/04/2021 1910   O2SAT 94.0 10/04/2021 1910       Melodie Bouillon, MD 10/05/2021 5:48 PM

## 2021-10-05 NOTE — Progress Notes (Signed)
1 Day Post-Op Procedure(s) (LRB): BENTALL PROCEDURE USING 25 MM KONNECT RESILIA  AORTIC VALVED CONDUIT (N/A) TRANSESOPHAGEAL ECHOCARDIOGRAM (TEE) (N/A) ENDOVEIN HARVEST OF GREATER SAPHENOUS VEIN (Right) CORONARY ARTERY BYPASS GRAFTING (CABG) TIMES ONE USING RIGHT GREATER SAPHENOUS VEIN (N/A) Subjective: Some incisional pain  Objective: Vital signs in last 24 hours: Temp:  [97.9 F (36.6 C)-101.5 F (38.6 C)] 100.8 F (38.2 C) (02/09 0700) Pulse Rate:  [78-87] 85 (02/09 0700) Resp:  [0-26] 14 (02/09 0700) BP: (95-117)/(75-95) 109/78 (02/09 0700) SpO2:  [92 %-100 %] 94 % (02/09 0700) Arterial Line BP: (95-178)/(61-96) 106/65 (02/09 0700) FiO2 (%):  [40 %-50 %] 40 % (02/08 1736) Weight:  [92.1 kg] 92.1 kg (02/09 0530)  Hemodynamic parameters for last 24 hours: PAP: (23-47)/(9-28) 30/23 CO:  [3.2 L/min-5 L/min] 4.2 L/min CI:  [1.6 L/min/m2-2.5 L/min/m2] 2.1 L/min/m2  Intake/Output from previous day: 02/08 0701 - 02/09 0700 In: 4214.7 [I.V.:2534.9; Blood:800; IV Piggyback:879.8] Out: 6712 [Urine:2370; Blood:1200; Chest Tube:650] Intake/Output this shift: No intake/output data recorded.  General appearance: alert, cooperative, and no distress Neurologic: intact Heart: regular rate and rhythm Lungs: diminished breath sounds bibasilar Abdomen: normal findings: soft, non-tender + pericardial rub  Lab Results: Recent Labs    10/04/21 2003 10/05/21 0440  WBC 13.5* 15.9*  HGB 13.1 12.6*  HCT 38.7* 38.4*  PLT 154 153   BMET:  Recent Labs    10/04/21 2003 10/05/21 0440  NA 136 135  K 4.8 4.6  CL 105 102  CO2 21* 22  GLUCOSE 143* 129*  BUN 17 19  CREATININE 1.17 1.18  CALCIUM 7.4* 7.6*    PT/INR:  Recent Labs    10/04/21 1357  LABPROT 15.3*  INR 1.2   ABG    Component Value Date/Time   PHART 7.332 (L) 10/04/2021 1910   HCO3 23.9 10/04/2021 1910   TCO2 25 10/04/2021 1910   ACIDBASEDEF 2.0 10/04/2021 1910   O2SAT 94.0 10/04/2021 1910   CBG (last 3)   Recent Labs    10/05/21 0235 10/05/21 0439 10/05/21 0643  GLUCAP 136* 127* 114*    Assessment/Plan: S/P Procedure(s) (LRB): BENTALL PROCEDURE USING 25 MM KONNECT RESILIA  AORTIC VALVED CONDUIT (N/A) TRANSESOPHAGEAL ECHOCARDIOGRAM (TEE) (N/A) ENDOVEIN HARVEST OF GREATER SAPHENOUS VEIN (Right) CORONARY ARTERY BYPASS GRAFTING (CABG) TIMES ONE USING RIGHT GREATER SAPHENOUS VEIN (N/A) POD # 1 doing well NEURO-intact CV- in Sr, good cardiac index  Dc Swan and A line  ASA, beta blocker, statin  Resume Xarelto prior to dc RESP- IS for atelectasis RENAL- creatinine normal, lytes OK  Fluid overload- diurese as BP allows ENDO- CBG well controlled  Transition to [facility]- advance diet as tolerated SCD + enoxaparin until Xarelto resumed Cardiac rehab   LOS: 1 day    Adrian Franco 10/05/2021

## 2021-10-05 NOTE — Discharge Summary (Signed)
Physician Discharge Summary       [address]       [place] [address]             [phone]    Patient ID: Adrian Franco MRN: [id number] DOB/AGE: October 10, 1945 76 y.o.  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 Expected post op blood loss anemia 3. History of the following: Arthritis      Atypical chest pain     BPH (benign prostatic hypertrophy)     Chronic back pain     Depression     DVT (deep venous thrombosis) (HCC)     GERD (gastroesophageal reflux disease)     History of kidney stones     HTN (hypertension)     Hyperlipemia     Hypogonadism in male     Insomnia     Localized edema     PE (pulmonary embolism)     Renal insufficiency     Restless leg syndrome     Sleep apnea      severe OSA-cannot tolerate CPAP   SOB (shortness of breath)     Testicular hypofunction   History of anxiety  Consults: None  Procedure (s):  Median sternotomy, extracorporeal circulation, biologic Bentall procedure using 25 mm Konect Resilia aortic valve conduit, coronary bypass grafting x1 with saphenous vein graft to right coronary artery, endoscopic vein harvest, right thigh by Dr. Roxan Hockey on 10/04/2021.  HPI:  Patient is [name] with a History of Hypertension, Hyperlipidemia, DVT, PVT aortic insufficiency, ascending aneurysm, sleep apnea, arthritis, BPH, back pain and depression who was seen in cardiothoracic surgical consultation by Dr. Roxan Hockey.  He presented with progressive dyspnea and has had some presyncopal episodes.  An echocardiogram revealed severe aortic insufficiency with preserved left ventricular systolic function.  There is also a dilated aortic root.  Dr. Roxan Hockey evaluated the patient and studies and recommended aortic valve replacement.  A CT angio was also performed to better delineate the  anatomy at the aortic root and ascending aorta.  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  It appeared to taper to normal prior to the takeoff of the innominate artery and there is no arch aneurysm.  Dr. Roxan Hockey recommended aortic valve replacement with biologic Bentall procedure.  He was admitted to the hospital electively for the procedure.  Hospital course  The patient was admitted electively and taken to the operating on 10/04/2021 at which time he underwent a Bentall procedure with a 25 mm KONNECT Resilia aortic valved conduit.  Intraoperatively it was noted that the right coronary artery ostium was severely calcified LAD and not a candidate for surgical reimplantation.  Therefore a saphenous vein graft to the RCA bypass was placed.  He tolerated procedure well was taken to the surgical intensive care unit in stable condition. Gordy Councilman and a line were removed on post op day one. Chest tubes and foley the following day. He maintained sinus rhythm and was on no pressor. He was transitioned off the Insulin drip. His pre op HGA1c was 5.4. Accu checks and SS PRN will be stopped upon transfer. He had expected post op blood loss anemia. He was volume overloaded and diuresed accordingly.  He developed mild acute kidney injury with creatinine rising to 2.0 on postop day 2.  By the following day, it had improved to 1.5.  Urine output remains satisfactory.  On postop day 3, he was transferred to [facility].  On the following morning he developed atrial fibrillation with controlled ventricular rate and was loaded with IV amiodarone.  His pacer wires were removed uneventfully.  On postop day 4, he was started back on his Xarelto for his history of DVT and pulmonary embolus.  The aspirin dose was decreased to 81 mg p.o. daily.  Lovenox was discontinued.  Pulmonary hygiene and ambulation were encouraged.  He was weaned from supplemental  oxygen without difficulty. Sternal and right lower extremity wounds are clean, dry, and healing without signs of infection. He was in sinus rhythm so he was transitioned from IV Amiodarone to oral on 02/13. He has been tolerating a diet and has had a bowel movement. He was put on Keflex for right forearm phlebitis (IV Amiodarone infused). He continued to maintain sinus rhythm and Amiodarone was titrated accordingly. As discussed with Dr. Roxan Hockey, he is felt surgically stable for discharge today.     Latest Vital Signs: Blood pressure 137/72, pulse 67, temperature 97.6 F (36.4 C), temperature source Oral, resp. rate 20, height 5\' 9"  (1.753 m), weight 92.5 kg, SpO2 94 %.  Physical Exam: Cardiovascular: RRR Pulmonary: Slightly diminished bibasilar breath sounds Abdomen: Soft, non tender, bowel sounds present. Extremities: Mild bilateral lower extremity edema. Wounds: Sternal and RLE wounds are clean and dry.  No erythema or signs of infection. IV site right lateral forearm has some erythema and is tender (IV Amiodarone infused through this). Chest tube wound open;no sign of infection  Discharge Condition:Stable and discharged to home.  Recent laboratory studies:  Lab Results  Component Value Date   WBC 9.0 10/10/2021   HGB 8.4 (L) 10/10/2021   HCT 26.6 (L) 10/10/2021   MCV 94.0 10/10/2021   PLT 231 10/10/2021   Lab Results  Component Value Date   NA 136 10/10/2021   K 4.2 10/10/2021   CL 101 10/10/2021   CO2 25 10/10/2021   CREATININE 1.23 10/10/2021   GLUCOSE 118 (H) 10/10/2021      Diagnostic Studies: DG Chest 2 View  Result Date: 10/09/2021 CLINICAL DATA:  Aortic valve replacement EXAM: CHEST - 2 VIEW COMPARISON:  10/07/2021 FINDINGS: Prior median sternotomy and aortic valve replacement. Stable cardiomegaly. Small layering bilateral pleural effusions, similar to prior. Improving aeration the left lung base. No pneumothorax. Neurostimulator pack and lead project over the  right upper chest wall. IMPRESSION: Improving aeration of the left lung base. Small bilateral pleural effusions, similar to prior. Electronically Signed   By: Davina Poke D.O.   On: 10/09/2021 08:10   DG Chest 2 View  Result Date: 10/07/2021 CLINICAL DATA:  Aortic root and valve replacement. EXAM: CHEST - 2 VIEW COMPARISON:  10/06/2021 FINDINGS: Previous median sternotomy. Prosthetic aortic valve is visible. Neurostimulator extends into the neck. Previously seen right internal jugular venous sheath is removed. The right chest is clear. There is moderate atelectasis in the left lower lobe, with a left effusion. There is probably a small amount of fluid in the posterior right costophrenic angle as well. IMPRESSION: Left effusion with left lower lobe atelectasis. Probable small amount of pleural fluid in the posterior costophrenic angle on the right as well. Electronically Signed   By: Nelson Chimes M.D.   On: 10/07/2021 19:22   DG Chest 2 View  Result Date: 10/03/2021 CLINICAL DATA:  Preop evaluation for upcoming heart surgery,  initial encounter EXAM: CHEST - 2 VIEW COMPARISON:  09/19/2021 CT, chest x-ray from 10/20/2020 FINDINGS: Cardiac shadow is within normal limits. Aortic calcifications are noted. Stimulating device is again noted over the right chest. Lungs are clear. Postsurgical changes in the left shoulder are noted. IMPRESSION: No acute abnormality noted. Electronically Signed   By: Inez Catalina M.D.   On: 10/03/2021 11:12   DG Chest Port 1 View  Result Date: 10/06/2021 CLINICAL DATA:  Post sternotomy EXAM: PORTABLE CHEST 1 VIEW COMPARISON:  Chest x-ray dated October 05, 2021 FINDINGS: Interval removal of PA catheter with IJ line sheath remaining in place. Interval removal of mediastinal drain. Unchanged cardiac and mediastinal contours post median sternotomy and aortic valve replacement. Mild left basilar opacity, likely atelectasis. No large pleural effusion or pneumothorax. IMPRESSION:  Interval removal of PA catheter and mediastinal drain, otherwise stable support devices. Electronically Signed   By: Yetta Glassman M.D.   On: 10/06/2021 08:10   DG Chest Port 1 View  Result Date: 10/05/2021 CLINICAL DATA:  Chest soreness.  Open-heart surgery EXAM: PORTABLE CHEST 1 VIEW COMPARISON:  10/04/2021 FINDINGS: Interval removal of endotracheal and enteric tubes. Right IJ approach Swan-Ganz catheter has been slightly retracted with distal tip now terminating more proximally within the right pulmonary outflow. Mediastinal drain remains in place. Median sternotomy with prosthetic aortic valve. Stable heart size. Aortic atherosclerosis. Low lung volumes with left basilar atelectasis. No significant pleural fluid collection. No pneumothorax. IMPRESSION: 1. Interval removal of endotracheal and enteric tubes. 2. Slight retraction of right IJ approach Swan-Ganz catheter, with distal tip now terminating more proximally within the right pulmonary outflow. 3. Low lung volumes with left basilar atelectasis. Electronically Signed   By: Davina Poke D.O.   On: 10/05/2021 08:22   DG Chest Port 1 View  Result Date: 10/04/2021 CLINICAL DATA:  76 year old male with history of aortic root and aortic valve replacement. EXAM: PORTABLE CHEST 1 VIEW COMPARISON:  Chest x-ray 10/02/2021. FINDINGS: Patient is intubated, with the tip of the endotracheal tube approximately 7.1 cm above the carina. Nasogastric tube extends into the proximal stomach. Right internal jugular Cordis through which a Swan-Ganz catheter has been passed into a right descending pulmonary artery branch. Surgical drain projecting over the lower mediastinum, potentially mediastinal/pericardial drain. Electronic device projects over the right hemithorax with lead extending cephalad, potentially a deep brain stimulator. Lung volumes are low. Opacity at the left base favored to reflect postoperative subsegmental atelectasis. No definite consolidative  airspace disease. No pneumothorax. No pleural effusions. No evidence of pulmonary edema. Mild enlargement of the cardiopericardial silhouette, within normal limits for this postoperative patient. Upper mediastinal contours are within normal limits. Status post median sternotomy for aortic valve/aortic root replacement. Status post left shoulder arthroplasty. IMPRESSION: 1. Support apparatus and postoperative changes, as above. 2. Low lung volumes with probable postoperative subsegmental atelectasis in the left lower lobe. Electronically Signed   By: Vinnie Langton M.D.   On: 10/04/2021 14:11   CT ANGIO CHEST AORTA W/CM & OR WO/CM  Result Date: 09/19/2021 CLINICAL DATA:  Aortic valve insufficiency, etiology of cardiac valve disease unspecified. Aneurysm of ascending aorta without rupture. Preoperative evaluation to rule out surgical contraindication. EXAM: CT ANGIOGRAPHY CHEST WITH CONTRAST TECHNIQUE: Multidetector CT imaging of the chest was performed using the standard protocol during bolus administration of intravenous contrast. Multiplanar CT image reconstructions and MIPs were obtained to evaluate the vascular anatomy. RADIATION DOSE REDUCTION: This exam was performed according to the departmental dose-optimization program which includes  automated exposure control, adjustment of the mA and/or kV according to patient size and/or use of iterative reconstruction technique. CONTRAST:  74mL ISOVUE-370 IOPAMIDOL (ISOVUE-370) INJECTION 76% COMPARISON:  07/01/2020 FINDINGS: Cardiovascular: Again noted is a fusiform aneurysm of the ascending thoracic aorta. Ascending thoracic aorta measures up to 4.3 cm and previously measured 4.2 cm. Atherosclerotic calcifications at the aortic root. Atherosclerotic calcifications involving the proximal LAD and left circumflex coronary artery. Heart size is normal. Proximal descending thoracic aorta measures 3.6 cm and stable. Distal descending thoracic aorta measures 2.9 cm and  stable. Main pulmonary arteries are patent. Great vessels are patent with typical three-vessel arch anatomy. Celiac trunk and main branches are patent. Proximal SMA is patent. Mediastinum/Nodes: No mediastinal or hilar lymph node enlargement. No axillary lymph node enlargement. Lungs/Pleura: Trachea and mainstem bronchi are patent. No pleural effusions. Both lungs are clear. Upper Abdomen: Again noted is a cystic structure that appears to be involving the left kidney upper pole that measures roughly 3.1 cm. No acute abnormality in the upper abdomen. Musculoskeletal: Left shoulder replacement. Evidence for a neurostimulator device overlying the right anterior chest with a lead along the right anterior pleura. No acute bone abnormality. Review of the MIP images confirms the above findings. IMPRESSION: 1. 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 Recommend annual imaging followup by CTA or MRA. This recommendation follows 2010 ACCF/AHA/AATS/ACR/ASA/SCA/SCAI/SIR/STS/SVM Guidelines for the Diagnosis and Management of Patients with Thoracic Aortic Disease. Circulation. 2010; 121: W295-A213. Aortic aneurysm NOS (ICD10-I71.9) 2. No acute chest abnormality. 3. Aortic Atherosclerosis (ICD10-I70.0). Coronary artery calcifications. Electronically Signed   By: Markus Daft M.D.   On: 09/19/2021 12:00   ECHO INTRAOPERATIVE TEE  Result Date: 10/04/2021  *INTRAOPERATIVE TRANSESOPHAGEAL REPORT *  Patient Name:   KODAH MARET Date of Exam: 10/04/2021 Medical Rec #:  [id number]        Height:       69.0 in Accession #:    [id number]       Weight:       196.0 lb Date of Birth:  [birth year]         BSA:          2.05 m Patient Age:    33 years         BP:           127/79 mmHg Patient Gender: M                HR:           55 bpm. Exam Location:  Anesthesiology Transesophogeal exam was perform intraoperatively during surgical procedure. Patient was closely monitored  under general anesthesia during the entirety of examination. Indications:     Aortic Insufficiency i35.1 Sonographer:     Raquel Sarna Senior RDCS Performing Phys: Palmer Diagnosing Phys: Duane Boston MD Complications: No known complications during this procedure. POST-OP IMPRESSIONS Overall, there were no significant changes from pre-bypass. _ Aorta: A graft was placed in the ascending aorta for repair. _ Aortic Valve: A bovine bioprosthetic valve was placed, leaflets are freely mobile and leaflets thin Size; 39mm. No regurgitation post repair. No perivalvular leak noted. _ Comments: S/P aortic valve conduit insertion. Valve and consuit in good position, valve funtioning normally. LV function good. Exam otherwise unchanged. PRE-OP FINDINGS  Left Ventricle: The left ventricle has normal systolic function, with an ejection fraction of 60-65%. The cavity size was normal. No evidence of  left ventricular regional wall motion abnormalities. There is no left ventricular hypertrophy. Right Ventricle: The right ventricle has normal systolic function. The cavity was normal. There is no increase in right ventricular wall thickness. Left Atrium: Left atrial size was normal in size. No left atrial/left atrial appendage thrombus was detected. Right Atrium: Right atrial size was normal in size. Interatrial Septum: No atrial level shunt detected by color flow Doppler. Pericardium: There is no evidence of pericardial effusion. Mitral Valve: The mitral valve is normal in structure. Mitral valve regurgitation is trivial by color flow Doppler. There is No evidence of mitral stenosis. AI jet appears to impinge on the anterior leaflet of the MV. Tricuspid Valve: The tricuspid valve was normal in structure. Tricuspid valve regurgitation is trivial by color flow Doppler. No evidence of tricuspid stenosis is present. Aortic Valve: The aortic valve is tricuspid Aortic valve regurgitation is moderate by color flow Doppler. The  jet is eccentric anteriorly directed. There is no stenosis of the aortic valve, with a calculated valve area of 1.75 cm. There is moderate aortic annular calcification noted. Pulmonic Valve: The pulmonic valve was normal in structure. Pulmonic valve regurgitation is trivial by color flow Doppler. Aorta: There is severe dilatation of the ascending aorta, measuring 44 mm. There is evidence of plaque in the descending aorta; Grade II, measuring 2-75mm in size. +--------------+--------++  LEFT VENTRICLE            +--------------+--------++  PLAX 2D                    +------------+---------++ +--------------+--------++   3D Volume EF              LVIDd:         4.30 cm     +------------+---------++ +--------------+--------++   LV 3D EDV:   146.68 ml    LVIDs:         3.10 cm     +------------+---------++ +--------------+--------++   LV 3D ESV:   58.57 ml     LV PW:         1.30 cm     +------------+---------++ +--------------+--------++  LV IVS:        1.00 cm    +--------------+--------++  LVOT diam:     2.00 cm    +--------------+--------++  LV SV:         45 ml      +--------------+--------++  LV SV Index:   21.48      +--------------+--------++  LVOT Area:     3.14 cm   +--------------+--------++                            +--------------+--------++ +------------------+------------++  AORTIC VALVE                      +------------------+------------++  AV Area (Vmax):    1.62 cm       +------------------+------------++  AV Area (Vmean):   1.73 cm       +------------------+------------++  AV Area (VTI):     1.75 cm       +------------------+------------++  AV Vmax:           148.00 cm/s    +------------------+------------++  AV Vmean:          104.000 cm/s   +------------------+------------++  AV VTI:            0.338 m        +------------------+------------++  AV Peak Grad:      8.8 mmHg       +------------------+------------++  AV Mean Grad:      5.0 mmHg       +------------------+------------++  LVOT Vmax:          76.30 cm/s     +------------------+------------++  LVOT Vmean:        57.200 cm/s    +------------------+------------++  LVOT VTI:          0.188 m        +------------------+------------++  LVOT/AV VTI ratio: 0.56           +------------------+------------++  AR PHT:            738 msec       +------------------+------------++ +--------------+----------++  MITRAL VALVE                 +--------------+-------+ +--------------+----------++   SHUNTS                   MV Area (PHT): 2.85 cm      +--------------+-------+ +--------------+----------++   Systemic VTI:  0.19 m    MV PHT:        77.14 msec    +--------------+-------+ +--------------+----------++   Systemic Diam: 2.00 cm   MV Decel Time: 266 msec      +--------------+-------+ +--------------+----------++ +--------------+----------++  MV E velocity: 89.20 cm/s   +--------------+----------++  MV A velocity: 92.00 cm/s   +--------------+----------++  MV E/A ratio:  0.97         +--------------+----------++  Duane Boston MD Electronically signed by Duane Boston MD Signature Date/Time: 10/04/2021/2:44:28 PM    Final    VAS US DOPPLER PRE CABG  Result Date: 10/02/2021 PREOPERATIVE VASCULAR EVALUATION Patient Name:  CHANCELER PULLIN  Date of Exam:   10/02/2021 Medical Rec #: [id number]         Accession #:    [id number] Date of Birth: [birth year]          Patient Gender: M Patient Age:   71 years Exam Location:  [facility] Procedure:      VAS US DOPPLER PRE CABG Referring Phys: Remo Lipps HENDRICKSON --------------------------------------------------------------------------------  Indications:      Pre-CABG. Risk Factors:     Hypertension, hyperlipidemia. Comparison Study: no prior Performing Technologist: Archie Patten RVS  Examination Guidelines: A complete evaluation includes B-mode imaging, spectral Doppler, color Doppler, and power Doppler as needed of all accessible portions of each vessel. Bilateral testing is considered an integral part of a  complete examination. Limited examinations for reoccurring indications may be performed as noted.  Right Carotid Findings: +----------+--------+--------+--------+------------+--------+             PSV cm/s EDV cm/s Stenosis Describe     Comments  +----------+--------+--------+--------+------------+--------+  CCA Prox   103      16                heterogenous           +----------+--------+--------+--------+------------+--------+  CCA Distal 81       17                heterogenous           +----------+--------+--------+--------+------------+--------+  ICA Prox   68       24       1-39%    heterogenous           +----------+--------+--------+--------+------------+--------+  ICA Distal 56       18                                       +----------+--------+--------+--------+------------+--------+  ECA        105      15                                       +----------+--------+--------+--------+------------+--------+ +----------+--------+-------+--------+------------+             PSV cm/s EDV cms Describe Arm Pressure  +----------+--------+-------+--------+------------+  Subclavian 93                                      +----------+--------+-------+--------+------------+ +---------+--------+--+--------+--+---------+  Vertebral PSV cm/s 35 EDV cm/s 11 Antegrade  +---------+--------+--+--------+--+---------+ Left Carotid Findings: +----------+--------+--------+--------+------------+--------+             PSV cm/s EDV cm/s Stenosis Describe     Comments  +----------+--------+--------+--------+------------+--------+  CCA Prox   90       12                heterogenous           +----------+--------+--------+--------+------------+--------+  CCA Distal 60       9                 heterogenous           +----------+--------+--------+--------+------------+--------+  ICA Prox   73       19       1-39%    heterogenous           +----------+--------+--------+--------+------------+--------+  ICA Distal 63       20                                        +----------+--------+--------+--------+------------+--------+  ECA        116      12                                       +----------+--------+--------+--------+------------+--------+ +----------+--------+--------+--------+------------+  Subclavian PSV cm/s EDV cm/s Describe Arm Pressure  +----------+--------+--------+--------+------------+             75                                       +----------+--------+--------+--------+------------+ +---------+--------+--+--------+--+---------+  Vertebral PSV cm/s 31 EDV cm/s 10 Antegrade  +---------+--------+--+--------+--+---------+  ABI Findings: +--------+------------------+-----+---------+--------+  Right    Rt Pressure (mmHg) Index Waveform  Comment   +--------+------------------+-----+---------+--------+  Brachial                          triphasic           +--------+------------------+-----+---------+--------+ +--------+------------------+-----+---------+-------+  Left     Lt Pressure (mmHg) Index Waveform  Comment  +--------+------------------+-----+---------+-------+  Brachial                          triphasic          +--------+------------------+-----+---------+-------+  Right Doppler Findings: +--------+--------+-----+---------+--------+  Site     Pressure Index Doppler   Comments  +--------+--------+-----+---------+--------+  Brachial  triphasic           +--------+--------+-----+---------+--------+  Radial                  triphasic           +--------+--------+-----+---------+--------+  Ulnar                   triphasic           +--------+--------+-----+---------+--------+  Left Doppler Findings: +--------+--------+-----+---------+--------+  Site     Pressure Index Doppler   Comments  +--------+--------+-----+---------+--------+  Brachial                triphasic           +--------+--------+-----+---------+--------+  Radial                  triphasic           +--------+--------+-----+---------+--------+  Ulnar                    triphasic           +--------+--------+-----+---------+--------+  Summary: Right Carotid: Velocities in the right ICA are consistent with a 1-39% stenosis. Left Carotid: Velocities in the left ICA are consistent with a 1-39% stenosis. Vertebrals: Bilateral vertebral arteries demonstrate antegrade flow. Right Upper Extremity: Doppler waveforms remain within normal limits with right radial compression. Doppler waveforms decrease 50% with right ulnar compression. Left Upper Extremity: Doppler waveforms remain within normal limits with left radial compression. Doppler waveform obliterate with left ulnar compression.  Electronically signed by Orlie Pollen on 10/02/2021 at 7:54:57 PM.    Final      Discharge Medications: Allergies as of 10/10/2021       Reactions   Sulfa Antibiotics Itching   Lotensin [benazepril Hcl] Hives, Rash        Medication List     STOP taking these medications    irbesartan 75 MG tablet Commonly known as: AVAPRO   simvastatin 40 MG tablet Commonly known as: ZOCOR       TAKE these medications    acetaminophen 500 MG tablet Commonly known as: TYLENOL Take 1,000 mg by mouth every 8 (eight) hours as needed for moderate pain.   amiodarone 200 MG tablet Commonly known as: PACERONE Take 1 tablet (200 mg total) by mouth 2 (two) times daily. for one week;then take 200 mg daily thereafter   aspirin 81 MG EC tablet Take 1 tablet (81 mg total) by mouth daily. Swallow whole.   atorvastatin 20 MG tablet Commonly known as: LIPITOR Take 1 tablet (20 mg total) by mouth daily.   B-12 5000 MCG Caps Take 5,000 mcg by mouth daily.   cephALEXin 500 MG capsule Commonly known as: KEFLEX Take 1 capsule (500 mg total) by mouth every 8 (eight) hours. Please take until finished   docusate sodium 100 MG capsule Commonly known as: COLACE Take 100 mg by mouth daily as needed for moderate constipation.   furosemide 40 MG tablet Commonly known as: LASIX Take 1  tablet (40 mg total) by mouth daily. For 5 days then stop.   guaiFENesin 600 MG 12 hr tablet Commonly known as: MUCINEX Take 1 tablet (600 mg total) by mouth 2 (two) times daily as needed for to loosen phlegm or cough.   LORazepam 0.5 MG tablet Commonly known as: ATIVAN Take 0.5 mg by mouth at bedtime.   magnesium oxide 400 MG tablet Commonly known as: MAG-OX Take 400 mg by mouth daily.  metoprolol tartrate 25 MG tablet Commonly known as: LOPRESSOR Take 1 tablet (25 mg total) by mouth 2 (two) times daily.   omeprazole 40 MG capsule Commonly known as: PRILOSEC Take 40 mg by mouth daily.   One-A-Day Mens 50+ Advantage Tabs Take 1 tablet by mouth daily with breakfast.   oxyCODONE 5 MG immediate release tablet Commonly known as: Oxy IR/ROXICODONE Take 1 tablet (5 mg total) by mouth every 6 (six) hours as needed for severe pain.   oxymetazoline 0.05 % nasal spray Commonly known as: AFRIN Place 1 spray into both nostrils at bedtime.   potassium chloride SA 20 MEQ tablet Commonly known as: KLOR-CON M Take 1 tablet (20 mEq total) by mouth daily. For 5 days then stop.   pregabalin 50 MG capsule Commonly known as: LYRICA Take 50 mg by mouth 2 (two) times daily.   REFRESH OP Place 1 drop into the left eye daily.   rivaroxaban 20 MG Tabs tablet Commonly known as: XARELTO Take 1 tablet (20 mg total) by mouth daily with supper.   rOPINIRole 4 MG tablet Commonly known as: REQUIP Take 4 mg by mouth at bedtime.   SUPER COLLAGEN PLUS VITAMIN C PO Take 1 tablet by mouth daily.   tamsulosin 0.4 MG Caps capsule Commonly known as: FLOMAX Take 0.4 mg by mouth 2 (two) times daily.   Testosterone Cypionate 200 MG/ML Soln Inject 200 mg into the muscle every 14 (fourteen) days.   tiZANidine 2 MG tablet Commonly known as: ZANAFLEX Take 2 mg by mouth every 6 (six) hours as needed for muscle spasms.   vitamin C 1000 MG tablet Take 1,000 mg by mouth in the morning.   Vitamin D  50 MCG (2000 UT) tablet Take 2,000 Units by mouth daily.       The patient has been discharged on:   1.Beta Blocker:  Yes [  x ]                              No   [   ]                              If No, reason:  2.Ace Inhibitor/ARB: Yes [   ]                                     No  [  x  ]                                     If No, reason:Labile BP  3.Statin:   Yes [x   ]                  No  [   ]                  If No, reason:  4.Ecasa:  Yes  [  x ]                  No   [   ]                  If No, reason:  Patient had ACS upon admission:No  Plavix/P2Y12 inhibitor: Yes [   ]  No  [ x  ]   Follow Up Appointments:  Follow-up Information     Melrose Nakayama, MD. Go on 11/07/2021.   Specialty: Cardiothoracic Surgery Why: PA/LAT CXR to be taken (at [facility] which is in the same building as Dr. Leonarda Salon office) on 03/14 at 10:15 am;Appointment time is at 10:45 Contact information: [place] [address] [phone]         Imogene Burn, PA-C. Go on 10/25/2021.   Specialty: Cardiology Why: Appointment time is at 1:45 pm Contact information: [address] [place] [address] [phone]                 Signed: Sharalyn Ink [facility] 10/10/2021, 8:06 AM

## 2021-10-06 ENCOUNTER — Inpatient Hospital Stay (HOSPITAL_COMMUNITY): Payer: Medicare Other

## 2021-10-06 LAB — CBC
HCT: 30.7 % — ABNORMAL LOW (ref 39.0–52.0)
Hemoglobin: 10.2 g/dL — ABNORMAL LOW (ref 13.0–17.0)
MCH: 30.4 pg (ref 26.0–34.0)
MCHC: 33.2 g/dL (ref 30.0–36.0)
MCV: 91.4 fL (ref 80.0–100.0)
Platelets: 115 10*3/uL — ABNORMAL LOW (ref 150–400)
RBC: 3.36 MIL/uL — ABNORMAL LOW (ref 4.22–5.81)
RDW: 12.8 % (ref 11.5–15.5)
WBC: 15.2 10*3/uL — ABNORMAL HIGH (ref 4.0–10.5)
nRBC: 0 % (ref 0.0–0.2)

## 2021-10-06 LAB — GLUCOSE, CAPILLARY
Glucose-Capillary: 103 mg/dL — ABNORMAL HIGH (ref 70–99)
Glucose-Capillary: 105 mg/dL — ABNORMAL HIGH (ref 70–99)
Glucose-Capillary: 120 mg/dL — ABNORMAL HIGH (ref 70–99)
Glucose-Capillary: 126 mg/dL — ABNORMAL HIGH (ref 70–99)
Glucose-Capillary: 143 mg/dL — ABNORMAL HIGH (ref 70–99)
Glucose-Capillary: 16 mg/dL — CL (ref 70–99)
Glucose-Capillary: 162 mg/dL — ABNORMAL HIGH (ref 70–99)

## 2021-10-06 LAB — BASIC METABOLIC PANEL
Anion gap: 8 (ref 5–15)
BUN: 28 mg/dL — ABNORMAL HIGH (ref 8–23)
CO2: 26 mmol/L (ref 22–32)
Calcium: 8.1 mg/dL — ABNORMAL LOW (ref 8.9–10.3)
Chloride: 99 mmol/L (ref 98–111)
Creatinine, Ser: 1.53 mg/dL — ABNORMAL HIGH (ref 0.61–1.24)
GFR, Estimated: 47 mL/min — ABNORMAL LOW (ref >60)
Glucose, Bld: 131 mg/dL — ABNORMAL HIGH (ref 70–99)
Potassium: 4.4 mmol/L (ref 3.5–5.1)
Sodium: 133 mmol/L — ABNORMAL LOW (ref 135–145)

## 2021-10-06 MED ORDER — SODIUM CHLORIDE 0.9 % IV SOLN: 250.0000 mL | INTRAVENOUS | Status: DC | PRN

## 2021-10-06 MED ORDER — ASPIRIN EC 81 MG PO TBEC
81.0000 mg | DELAYED_RELEASE_TABLET | Freq: Every day | ORAL | Status: DC
  Administered 2021-10-06 – 2021-10-10 (×5): 81 mg via ORAL
  Filled 2021-10-06 (×5): qty 1

## 2021-10-06 MED ORDER — FUROSEMIDE 40 MG PO TABS
40.0000 mg | ORAL_TABLET | Freq: Every day | ORAL | Status: DC
  Administered 2021-10-06 – 2021-10-10 (×5): 40 mg via ORAL
  Filled 2021-10-06 (×5): qty 1

## 2021-10-06 MED ORDER — MAGNESIUM HYDROXIDE 400 MG/5ML PO SUSP: 30.0000 mL | Freq: Every day | ORAL | Status: DC | PRN

## 2021-10-06 MED ORDER — ALUM & MAG HYDROXIDE-SIMETH 200-200-20 MG/5ML PO SUSP
15.0000 mL | Freq: Four times a day (QID) | ORAL | Status: DC | PRN
  Administered 2021-10-06: 15 mL via ORAL
  Filled 2021-10-06: qty 30

## 2021-10-06 MED ORDER — ~~LOC~~ CARDIAC SURGERY, PATIENT & FAMILY EDUCATION: Freq: Once | Status: AC

## 2021-10-06 MED ORDER — INSULIN ASPART 100 UNIT/ML IJ SOLN: 0.0000 [IU] | Freq: Every day | INTRAMUSCULAR | Status: DC

## 2021-10-06 MED ORDER — INSULIN ASPART 100 UNIT/ML IJ SOLN
0.0000 [IU] | Freq: Three times a day (TID) | INTRAMUSCULAR | Status: DC
  Administered 2021-10-07: 2 [IU] via SUBCUTANEOUS

## 2021-10-06 MED ORDER — SODIUM CHLORIDE 0.9% FLUSH: 3.0000 mL | INTRAVENOUS | Status: DC | PRN

## 2021-10-06 MED ORDER — SODIUM CHLORIDE 0.9% FLUSH
3.0000 mL | Freq: Two times a day (BID) | INTRAVENOUS | Status: DC
  Administered 2021-10-06 – 2021-10-09 (×7): 3 mL via INTRAVENOUS

## 2021-10-06 NOTE — Progress Notes (Addendum)
CARDIAC REHAB PHASE I   PRE:  Rate/Rhythm: 81 SR    BP: sitting     SaO2: 96 3L  MODE:  Ambulation: 470 ft   POST:  Rate/Rhythm: 100 ST    BP: sitting 117/66     SaO2: 98 3L, 89-91 RA  Pt on EOB ready to ambulate. Stood with verbal cues for sternal precautions. Walked with standby assist with RW, 3L. No c/o, slow pace. To recliner. saO2 stable on 3L. Tried RA in recliner.. Family present and giving care. Encouraged IS, 1200 ml. [phone]   [place], ACSM 10/06/2021 2:49 PM

## 2021-10-06 NOTE — Progress Notes (Signed)
Pt arrived to unit from 2hearA/O x 4,  CCMD called ,CHG given, pt oriented to unit. Pt has external pacing wires attached to box Rate 70 MA10 . Pt has not needed pacing.   Albin Felling Tc Kapusta, RN    10/06/21 1027  Vitals  Temp 98.5 F (36.9 C)  Temp Source Oral  BP 104/65  MAP (mmHg) 78  BP Location Left Arm  BP Method Automatic  Patient Position (if appropriate) Sitting  Pulse Rate 74  Pulse Rate Source Monitor  ECG Heart Rate 75  Resp 20  Level of Consciousness  Level of Consciousness Alert  Oxygen Therapy  SpO2 95 %  O2 Device Nasal Cannula  O2 Flow Rate (L/min) 1 L/min  ECG Monitoring  Cardiac Rhythm NSR  Pain Assessment  Pain Scale 0-10  Pain Score 0  MEWS Score  MEWS Temp 0  MEWS Systolic 0  MEWS Pulse 0  MEWS RR 0  MEWS LOC 0  MEWS Score 0  MEWS Score Color Nyoka Cowden

## 2021-10-06 NOTE — Progress Notes (Signed)
2 Days Post-Op Procedure(s) (LRB): BENTALL PROCEDURE USING 25 MM KONNECT RESILIA  AORTIC VALVED CONDUIT (N/A) TRANSESOPHAGEAL ECHOCARDIOGRAM (TEE) (N/A) ENDOVEIN HARVEST OF GREATER SAPHENOUS VEIN (Right) CORONARY ARTERY BYPASS GRAFTING (CABG) TIMES ONE USING RIGHT GREATER SAPHENOUS VEIN (N/A) Subjective: No complaints, denies pain and nausea Walked 2 laps this AM  Objective: Vital signs in last 24 hours: Temp:  [98.5 F (36.9 C)-100.4 F (38 C)] 98.5 F (36.9 C) (02/10 0400) Pulse Rate:  [70-85] 71 (02/10 0628) Cardiac Rhythm: Normal sinus rhythm (02/10 0500) Resp:  [13-25] 25 (02/10 0628) BP: (83-113)/(61-83) 90/63 (02/10 0600) SpO2:  [92 %-99 %] 93 % (02/10 0628) Arterial Line BP: (111-115)/(68-69) 115/69 (02/09 0900) Weight:  [92.4 kg] 92.4 kg (02/10 0628)  Hemodynamic parameters for last 24 hours: PAP: (26-35)/(20-22) 35/20  Intake/Output from previous day: 02/09 0701 - 02/10 0700 In: 669.6 [P.O.:510; I.V.:101.7; IV Piggyback:57.9] Out: 1440 [Urine:1400; Chest Tube:40] Intake/Output this shift: No intake/output data recorded.  General appearance: alert, cooperative, and no distress Neurologic: intact Heart: regular rate and rhythm and + faint rub Lungs: diminished breath sounds left base Abdomen: normal findings: soft, non-tender and + BS  Lab Results: Recent Labs    10/05/21 1714 10/06/21 0144  WBC 16.2* 15.2*  HGB 11.4* 10.2*  HCT 35.4* 30.7*  PLT 125* 115*   BMET:  Recent Labs    10/05/21 1714 10/06/21 0144  NA 133* 133*  K 4.4 4.4  CL 100 99  CO2 23 26  GLUCOSE 173* 131*  BUN 28* 28*  CREATININE 2.04* 1.53*  CALCIUM 8.0* 8.1*    PT/INR:  Recent Labs    10/04/21 1357  LABPROT 15.3*  INR 1.2   ABG    Component Value Date/Time   PHART 7.332 (L) 10/04/2021 1910   HCO3 23.9 10/04/2021 1910   TCO2 25 10/04/2021 1910   ACIDBASEDEF 2.0 10/04/2021 1910   O2SAT 94.0 10/04/2021 1910   CBG (last 3)  Recent Labs    10/05/21 2008  10/06/21 0010 10/06/21 0434  GLUCAP 144* 126* 143*    Assessment/Plan: S/P Procedure(s) (LRB): BENTALL PROCEDURE USING 25 MM KONNECT RESILIA  AORTIC VALVED CONDUIT (N/A) TRANSESOPHAGEAL ECHOCARDIOGRAM (TEE) (N/A) ENDOVEIN HARVEST OF GREATER SAPHENOUS VEIN (Right) CORONARY ARTERY BYPASS GRAFTING (CABG) TIMES ONE USING RIGHT GREATER SAPHENOUS VEIN (N/A) Plan for transfer to step-down: see transfer orders POD # 2 Doing well NEURO- intact  Cv- in Sr, BP a little low- hold off on resuming irbesartan for now  ASA (decrease to 81 mg), statin, beta blocker  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

## 2021-10-07 ENCOUNTER — Inpatient Hospital Stay (HOSPITAL_COMMUNITY): Payer: Medicare Other

## 2021-10-07 LAB — BASIC METABOLIC PANEL
Anion gap: 9 (ref 5–15)
BUN: 26 mg/dL — ABNORMAL HIGH (ref 8–23)
CO2: 27 mmol/L (ref 22–32)
Calcium: 8.2 mg/dL — ABNORMAL LOW (ref 8.9–10.3)
Chloride: 98 mmol/L (ref 98–111)
Creatinine, Ser: 1.14 mg/dL (ref 0.61–1.24)
GFR, Estimated: >60 mL/min (ref >60)
Glucose, Bld: 124 mg/dL — ABNORMAL HIGH (ref 70–99)
Potassium: 4.3 mmol/L (ref 3.5–5.1)
Sodium: 134 mmol/L — ABNORMAL LOW (ref 135–145)

## 2021-10-07 LAB — CBC
HCT: 26.8 % — ABNORMAL LOW (ref 39.0–52.0)
Hemoglobin: 9.1 g/dL — ABNORMAL LOW (ref 13.0–17.0)
MCH: 31.1 pg (ref 26.0–34.0)
MCHC: 34 g/dL (ref 30.0–36.0)
MCV: 91.5 fL (ref 80.0–100.0)
Platelets: 110 10*3/uL — ABNORMAL LOW (ref 150–400)
RBC: 2.93 MIL/uL — ABNORMAL LOW (ref 4.22–5.81)
RDW: 12.7 % (ref 11.5–15.5)
WBC: 12.1 10*3/uL — ABNORMAL HIGH (ref 4.0–10.5)
nRBC: 0 % (ref 0.0–0.2)

## 2021-10-07 LAB — GLUCOSE, CAPILLARY
Glucose-Capillary: 123 mg/dL — ABNORMAL HIGH (ref 70–99)
Glucose-Capillary: 105 mg/dL — ABNORMAL HIGH (ref 70–99)
Glucose-Capillary: 142 mg/dL — ABNORMAL HIGH (ref 70–99)
Glucose-Capillary: 121 mg/dL — ABNORMAL HIGH (ref 70–99)

## 2021-10-07 NOTE — Progress Notes (Addendum)
CARDIAC REHAB PHASE I   PRE:  Rate/Rhythm: 30 SR with occ pacing spikes  BP:  Supine:   Sitting: 93/64  Standing:    SaO2: 96 1 1/2 liters  MODE:  Ambulation: 344 ft   POST:  Rate/Rhythm: 94 SR  BP:  Supine:   Sitting: 130/71  Standing:    SaO2: 95 RA 0955-1045 On arrival pt states that he needs to go to the bathroom, assisted there and he had a large bowel movement yellowish brown semi formed. From the bathroom assisted X1 to ambulate with walker. Gait slow steady. He c/o of no energy today. He was able to walk 344 feet. To recliner after walk with call light in reach and feet elevated. Room air saturation after walk 95 % oxygen left off, encouraged use of IS and two more walks today. Reported to RN. Rodney Langton RN 10/07/2021 10:41 AM

## 2021-10-08 LAB — TSH: TSH: 2.679 u[IU]/mL (ref 0.350–4.500)

## 2021-10-08 LAB — GLUCOSE, CAPILLARY
Glucose-Capillary: 93 mg/dL (ref 70–99)
Glucose-Capillary: 100 mg/dL — ABNORMAL HIGH (ref 70–99)
Glucose-Capillary: 120 mg/dL — ABNORMAL HIGH (ref 70–99)
Glucose-Capillary: 140 mg/dL — ABNORMAL HIGH (ref 70–99)

## 2021-10-08 MED ORDER — RIVAROXABAN 20 MG PO TABS
20.0000 mg | ORAL_TABLET | Freq: Every day | ORAL | Status: DC
  Administered 2021-10-08 – 2021-10-09 (×2): 20 mg via ORAL
  Filled 2021-10-08 (×2): qty 1

## 2021-10-08 MED ORDER — AMIODARONE HCL IN DEXTROSE 360-4.14 MG/200ML-% IV SOLN
60.0000 mg/h | INTRAVENOUS | Status: AC
  Administered 2021-10-08 (×2): 60 mg/h via INTRAVENOUS
  Filled 2021-10-08: qty 200

## 2021-10-08 MED ORDER — AMIODARONE LOAD VIA INFUSION
150.0000 mg | Freq: Once | INTRAVENOUS | Status: AC
  Administered 2021-10-08: 150 mg via INTRAVENOUS
  Filled 2021-10-08: qty 83.34

## 2021-10-08 MED ORDER — POTASSIUM CHLORIDE CRYS ER 20 MEQ PO TBCR
20.0000 meq | EXTENDED_RELEASE_TABLET | Freq: Two times a day (BID) | ORAL | Status: DC
  Administered 2021-10-08 – 2021-10-10 (×5): 20 meq via ORAL
  Filled 2021-10-08 (×5): qty 1

## 2021-10-08 MED ORDER — METOPROLOL TARTRATE 25 MG PO TABS
25.0000 mg | ORAL_TABLET | Freq: Two times a day (BID) | ORAL | Status: DC
  Administered 2021-10-08 – 2021-10-10 (×4): 25 mg via ORAL
  Filled 2021-10-08 (×4): qty 1

## 2021-10-08 MED ORDER — SIMVASTATIN 20 MG PO TABS
20.0000 mg | ORAL_TABLET | Freq: Every day | ORAL | Status: DC
  Administered 2021-10-09: 20 mg via ORAL
  Filled 2021-10-08: qty 1

## 2021-10-08 MED ORDER — AMIODARONE HCL IN DEXTROSE 360-4.14 MG/200ML-% IV SOLN
30.0000 mg/h | INTRAVENOUS | Status: DC
  Administered 2021-10-08: 30 mg/h via INTRAVENOUS
  Filled 2021-10-08 (×5): qty 200

## 2021-10-08 NOTE — Progress Notes (Signed)
Removed epicardial pacing wires per order. Tips intact. Patient tolerated well. Patient was educated on bedrest for one hour. Patient verbalized understanding.VSS.  Daymon Larsen, RN

## 2021-10-08 NOTE — Progress Notes (Signed)
°  Amiodarone Drug - Drug Interaction Consult Note  Amiodarone is metabolized by the cytochrome P450 system and therefore has the potential to cause many drug interactions. Amiodarone has an average plasma half-life of 50 days (range 20 to 100 days).   There is potential for drug interactions to occur several weeks or months after stopping treatment and the onset of drug interactions may be slow after initiating amiodarone.   [x]  Statins: Increased risk of myopathy.  Simvastatin- restrict dose to 20mg  daily. Other statins: counsel patients to report any muscle pain or weakness immediately. Reduced simvastatin 40 mg daily to simvastatin 20 mg daily   [x]  Anticoagulants: Amiodarone can increase anticoagulant effect. Consider warfarin dose reduction. Patients should be monitored closely and the dose of anticoagulant altered accordingly, remembering that amiodarone levels take several weeks to stabilize.  Monitor for any s/sx of bleeding with rivaroxaban   []  Antiepileptics: Amiodarone can increase plasma concentration of phenytoin, the dose should be reduced. Note that small changes in phenytoin dose can result in large changes in levels. Monitor patient and counsel on signs of toxicity.  [x]  Beta blockers: increased risk of bradycardia, AV block and myocardial depression. Sotalol - avoid concomitant use.  Metoprolol 25 mg BID - monitor heart rate and rhythm; decrease dose if indicated by heart rate  []   Calcium channel blockers (diltiazem and verapamil): increased risk of bradycardia, AV block and myocardial depression.  []   Cyclosporine: Amiodarone increases levels of cyclosporine. Reduced dose of cyclosporine is recommended.  []  Digoxin dose should be halved when  amiodarone is started.  [x]  Diuretics: increased risk of cardiotoxicity if  hypokalemia occurs. CMP ordered for tomorrow; BMP ordered to start Tuesday. Monitor K and replete to keep K >4  []  Oral hypoglycemic agents (glyburide,  glipizide, glimepiride): increased risk of hypoglycemia. Patient's glucose levels should be monitored closely when initiating amiodarone therapy.   []  Drugs that prolong the QT interval:  Torsades de pointes risk may be increased with concurrent use - avoid if possible.  Monitor QTc, also keep magnesium/potassium WNL if concurrent therapy can't be avoided.  Antibiotics: e.g. fluoroquinolones, erythromycin.  Antiarrhythmics: e.g. quinidine, procainamide, disopyramide, sotalol.  Antipsychotics: e.g. phenothiazines, haloperidol.   Lithium, tricyclic antidepressants, and methadone.   Thank you for including pharmacy in the care of this patient.  Zenaida Deed, PharmD PGY1 Acute Care Pharmacy Resident  Phone: [phone] 10/08/2021  1:48 PM  Please check [email or web address] for unit-specific pharmacy phone numbers.

## 2021-10-08 NOTE — Progress Notes (Addendum)
[address]       [place] [address]             [phone]      4 Days Post-Op Procedure(s) (LRB): BENTALL PROCEDURE USING 25 MM KONNECT RESILIA  AORTIC VALVED CONDUIT (N/A) TRANSESOPHAGEAL ECHOCARDIOGRAM (TEE) (N/A) ENDOVEIN HARVEST OF GREATER SAPHENOUS VEIN (Right) CORONARY ARTERY BYPASS GRAFTING (CABG) TIMES ONE USING RIGHT GREATER SAPHENOUS VEIN (N/A) Subjective:  Transferred from ICU yesterday.  Up in the bedside chair, feels good and has no concerns except for occasional non-productive cough.  New a-fib this morning.  BM yesterday.  Objective: Vital signs in last 24 hours: Temp:  [97.7 F (36.5 C)-98.9 F (37.2 C)] 97.9 F (36.6 C) (02/12 0809) Pulse Rate:  [74-87] 79 (02/12 0809) Cardiac Rhythm: Normal sinus rhythm;Heart block (02/11 1916) Resp:  [18-22] 18 (02/12 0809) BP: (99-144)/(68-88) 137/81 (02/12 0809) SpO2:  [91 %-99 %] 96 % (02/12 0809)     Intake/Output from previous day: 02/11 0701 - 02/12 0700 In: 480.7 [P.O.:480; I.V.:0.7] Out: 380 [Urine:380] Intake/Output this shift: No intake/output data recorded.  General appearance: alert, cooperative, and no distress Neurologic: intact Heart: had som a-fib last night with controlled rate. Now SR Lungs: clear to auscultation bilaterally Abdomen: Soft, non-tender. Extremities: mild expected bruising right thigh.  Wound: the sternal incision is intact and dry.  Lab Results: Recent Labs    10/06/21 0144 10/07/21 0151  WBC 15.2* 12.1*  HGB 10.2* 9.1*  HCT 30.7* 26.8*  PLT 115* 110*   BMET:  Recent Labs    10/06/21 0144 10/07/21 0151  NA 133* 134*  K 4.4 4.3  CL 99 98  CO2 26 27  GLUCOSE 131* 124*  BUN 28* 26*  CREATININE 1.53* 1.14  CALCIUM 8.1* 8.2*    PT/INR: No results for input(s): LABPROT, INR in the last 72 hours. ABG    Component Value Date/Time   PHART 7.332 (L) 10/04/2021 1910   HCO3 23.9 10/04/2021 1910   TCO2 25 10/04/2021 1910   ACIDBASEDEF 2.0  10/04/2021 1910   O2SAT 94.0 10/04/2021 1910   CBG (last 3)  Recent Labs    10/07/21 1849 10/07/21 2143 10/08/21 0649  GLUCAP 142* 121* 93    Assessment/Plan: S/P Procedure(s) (LRB): BENTALL PROCEDURE USING 25 MM KONNECT RESILIA  AORTIC VALVED CONDUIT (N/A) TRANSESOPHAGEAL ECHOCARDIOGRAM (TEE) (N/A) ENDOVEIN HARVEST OF GREATER SAPHENOUS VEIN (Right) CORONARY ARTERY BYPASS GRAFTING (CABG) TIMES ONE USING RIGHT GREATER SAPHENOUS VEIN (N/A)  -POD-1 Bio-Bentall procedure and single -vessel CABG.  Progressing well.No bradycardia, will d/c the pacer wires today and resume his Xarelto (for h/o DVT and PE) this PM. D/C the Lovenox. Continue ambulation.   -Atrial fibrillation-new onset this morning, rate is well controlled. Will load with amiodarone. K+ 4.3, last Mg++ 2.6 on 2/9.   -PULM- no dyspnea on RA. Small left effusion and ATX on CXR yesterday. Continue ambulation and lung work. F/U 2V CXR in AM.  -GI- tolerating PO's, BM yesterday.   -HEME-Hgb 10>9 over past 24 hours. No evidence of bleeding.Leukocytosis resolving.  Monitor.   -RENAL- mild renal insufficiency post -op. Creat peaked at 2.04. Now 1.14.Monitor.   -Disposition- Planning eventual discharge to home. Request PT to eval for discharge needs.    LOS: 4 days    Malon Kindle [phone] 10/08/2021   Chart reviewed, patient examined, agree with above. Went into atrial fib with RVR this am and started on IV amio. Back in sinus rhythm  now. No signs of heart block and pacing wires removed this morning. Plan to resume Xarelto this pm. Has not ambulated yet today and encouraged to do so.

## 2021-10-09 ENCOUNTER — Encounter (HOSPITAL_COMMUNITY): Payer: Self-pay | Admitting: Thoracic Surgery (Cardiothoracic Vascular Surgery)

## 2021-10-09 ENCOUNTER — Inpatient Hospital Stay (HOSPITAL_COMMUNITY): Payer: Medicare Other

## 2021-10-09 ENCOUNTER — Other Ambulatory Visit: Payer: Self-pay

## 2021-10-09 LAB — COMPREHENSIVE METABOLIC PANEL
ALT: 78 U/L — ABNORMAL HIGH (ref 0–44)
AST: 140 U/L — ABNORMAL HIGH (ref 15–41)
Albumin: 2.5 g/dL — ABNORMAL LOW (ref 3.5–5.0)
Alkaline Phosphatase: 88 U/L (ref 38–126)
Anion gap: 10 (ref 5–15)
BUN: 21 mg/dL (ref 8–23)
CO2: 27 mmol/L (ref 22–32)
Calcium: 8.4 mg/dL — ABNORMAL LOW (ref 8.9–10.3)
Chloride: 98 mmol/L (ref 98–111)
Creatinine, Ser: 1.17 mg/dL (ref 0.61–1.24)
GFR, Estimated: >60 mL/min (ref >60)
Glucose, Bld: 107 mg/dL — ABNORMAL HIGH (ref 70–99)
Potassium: 4.4 mmol/L (ref 3.5–5.1)
Sodium: 135 mmol/L (ref 135–145)
Total Bilirubin: 0.9 mg/dL (ref 0.3–1.2)
Total Protein: 5.7 g/dL — ABNORMAL LOW (ref 6.5–8.1)

## 2021-10-09 LAB — CBC
HCT: 26.7 % — ABNORMAL LOW (ref 39.0–52.0)
Hemoglobin: 8.5 g/dL — ABNORMAL LOW (ref 13.0–17.0)
MCH: 30 pg (ref 26.0–34.0)
MCHC: 31.8 g/dL (ref 30.0–36.0)
MCV: 94.3 fL (ref 80.0–100.0)
Platelets: 191 10*3/uL (ref 150–400)
RBC: 2.83 MIL/uL — ABNORMAL LOW (ref 4.22–5.81)
RDW: 13.2 % (ref 11.5–15.5)
WBC: 9.8 10*3/uL (ref 4.0–10.5)
nRBC: 0.7 % — ABNORMAL HIGH (ref 0.0–0.2)

## 2021-10-09 LAB — GLUCOSE, CAPILLARY: Glucose-Capillary: 100 mg/dL — ABNORMAL HIGH (ref 70–99)

## 2021-10-09 MED ORDER — ATORVASTATIN CALCIUM 10 MG PO TABS
20.0000 mg | ORAL_TABLET | Freq: Every day | ORAL | Status: DC
  Administered 2021-10-10: 20 mg via ORAL
  Filled 2021-10-09: qty 2

## 2021-10-09 MED ORDER — AMIODARONE HCL 200 MG PO TABS
400.0000 mg | ORAL_TABLET | Freq: Two times a day (BID) | ORAL | Status: DC
  Administered 2021-10-09 – 2021-10-10 (×3): 400 mg via ORAL
  Filled 2021-10-09 (×3): qty 2

## 2021-10-09 NOTE — Progress Notes (Signed)
While introducing myself, pt expressed that he did not want to get another IV and did not know why he needed it.  Pt. Had IV infiltrations on both arms as well as several bruises. He "really did not want to be stuck again" He said he was told as long as he does well on oral amiodarone, he may go home tomorrow.  Pt and wife stated RN told them he needed an IV "just in case" he goes into cardiac arrest tonight. There were no IV medications ordered for the patient at that time. Pt remained stable, alert, and oriented during our lengthy conversation. I asked how he felt, he replied "good". I advised pt. And wife to let us know immediately if he felt any different at all. Both wife and pt expressed their appreciation for my help.  I spoke with RN about avoiding "just in case" Ivs. Best practice states that we put in PIVs when they are needed and when IV medications are prescribed. I also spoke with unit director. I advised both of them to call us at any time if there is any change in the pt condition and IV becomes needed.

## 2021-10-09 NOTE — Care Management Important Message (Signed)
Important Message  Patient Details  Name: ROBBIN ESCHER MRN: [id number] Date of Birth: 08/31/1945   Medicare Important Message Given:  Yes     Shelda Altes 10/09/2021, 9:17 AM

## 2021-10-09 NOTE — Progress Notes (Addendum)
IV team refused to stick patient to regain IV access. Paged PA for guidance because order states to maintain 1x access.   Chrisandra Carota, RN 10/09/2021 2:41 PM    PA Tacy Dura stated patient may be without IV access for now, unless patient were to go back into Afib we will need STAT IV placed.   Chrisandra Carota, RN 10/09/2021 4:52 PM

## 2021-10-09 NOTE — Progress Notes (Addendum)
° °   °  [address]       [place] [address]             [phone]        5 Days Post-Op Procedure(s) (LRB): BENTALL PROCEDURE USING 25 MM KONNECT RESILIA  AORTIC VALVED CONDUIT (N/A) TRANSESOPHAGEAL ECHOCARDIOGRAM (TEE) (N/A) ENDOVEIN HARVEST OF GREATER SAPHENOUS VEIN (Right) CORONARY ARTERY BYPASS GRAFTING (CABG) TIMES ONE USING RIGHT GREATER SAPHENOUS VEIN (N/A)  Subjective: Patient  has occasional cough;he is able to get up sputum  Objective: Vital signs in last 24 hours: Temp:  [97.9 F (36.6 C)-98.7 F (37.1 C)] 98 F (36.7 C) (02/13 0300) Pulse Rate:  [66-79] 71 (02/13 0300) Cardiac Rhythm: Heart block (02/12 1905) Resp:  [18-24] 18 (02/13 0300) BP: (107-149)/(74-86) 133/76 (02/13 0300) SpO2:  [92 %-98 %] 96 % (02/13 0300)  Pre op weight 88.9 kg Current Weight  10/07/21 92.5 kg      Intake/Output from previous day: 02/12 0701 - 02/13 0700 In: 1077.1 [P.O.:720; I.V.:357.1] Out: 300 [Urine:300]   Physical Exam:  Cardiovascular: RRR Pulmonary: Slightly diminished bibasilar breath sounds Abdomen: Soft, non tender, bowel sounds present. Extremities: Mild bilateral lower extremity edema. Wounds: Sternal and RLE wounds are clean and dry.  No erythema or signs of infection.  Lab Results: CBC: Recent Labs    10/07/21 0151 10/09/21 0151  WBC 12.1* 9.8  HGB 9.1* 8.5*  HCT 26.8* 26.7*  PLT 110* 191   BMET:  Recent Labs    10/07/21 0151 10/09/21 0151  NA 134* 135  K 4.3 4.4  CL 98 98  CO2 27 27  GLUCOSE 124* 107*  BUN 26* 21  CREATININE 1.14 1.17  CALCIUM 8.2* 8.4*    PT/INR:  Lab Results  Component Value Date   INR 1.2 10/04/2021   INR 1.0 10/02/2021   INR 1.0 06/08/2020   ABG:  INR: Will add last result for INR, ABG once components are confirmed Will add last 4 CBG results once components are confirmed  Assessment/Plan:  1. CV - A fib with RVR yesterday . Maintaining SR, first degree heart block this am. On  Amiodarone drip, Lopressor 25 mg bid, Xarelto 20 mg at supper. Transition to oral Amiodarone. May be able to restart low dose Irbesartan soon. 2.  Pulmonary - On room air. PA/LAT CXR appears to show left base atelectasis,? small bilateral pleural effusion Encourage incentive spirometer. 3. Volume Overload - On Lasix 40 mg daily 4.  Expected post op acute blood loss anemia - H and H this am decreased to 8.5 and 26.7 5. Thrombocytopenia resolved-platelets this am up to 191,000. 6. CBGs 120/140/100. Pre op HGA1C 5.4. Stop accu checks and SS PRN. 7. History of BPH-continue Flomax 8. Possibly home in am   Sharalyn Ink [facility] 10/09/2021,7:04 AM   Patient seen and examined, agree with above Looks great In SR this AM- change amiodarone to PO Probably home tomorrow  Remo Lipps C. Roxan Hockey, MD [facility] [phone]

## 2021-10-09 NOTE — Progress Notes (Signed)
CARDIAC REHAB PHASE I   PRE:  Rate/Rhythm: 65 SR    BP: sitting 122/84    SaO2: 93 RA  MODE:  Ambulation: 940 ft   POST:  Rate/Rhythm: 86 SR    BP: sitting 147/96     SaO2: 96 RA  Pt stood and ambulated with standby assist with RW. Steady, tolerated well. Sts he was getting weak at end. Did need a reminder toward end to stay inside RW on turn. Return to recliner. Sts he is practicing IS. VSS. Encouraged x2 more walks. [phone]   [place], ACSM 10/09/2021 10:42 AM

## 2021-10-10 LAB — BASIC METABOLIC PANEL
Anion gap: 10 (ref 5–15)
BUN: 20 mg/dL (ref 8–23)
CO2: 25 mmol/L (ref 22–32)
Calcium: 8.5 mg/dL — ABNORMAL LOW (ref 8.9–10.3)
Chloride: 101 mmol/L (ref 98–111)
Creatinine, Ser: 1.23 mg/dL (ref 0.61–1.24)
GFR, Estimated: >60 mL/min (ref >60)
Glucose, Bld: 118 mg/dL — ABNORMAL HIGH (ref 70–99)
Potassium: 4.2 mmol/L (ref 3.5–5.1)
Sodium: 136 mmol/L (ref 135–145)

## 2021-10-10 LAB — CBC
HCT: 26.6 % — ABNORMAL LOW (ref 39.0–52.0)
Hemoglobin: 8.4 g/dL — ABNORMAL LOW (ref 13.0–17.0)
MCH: 29.7 pg (ref 26.0–34.0)
MCHC: 31.6 g/dL (ref 30.0–36.0)
MCV: 94 fL (ref 80.0–100.0)
Platelets: 231 10*3/uL (ref 150–400)
RBC: 2.83 MIL/uL — ABNORMAL LOW (ref 4.22–5.81)
RDW: 13.2 % (ref 11.5–15.5)
WBC: 9 10*3/uL (ref 4.0–10.5)
nRBC: 0.6 % — ABNORMAL HIGH (ref 0.0–0.2)

## 2021-10-10 MED ORDER — GUAIFENESIN ER 600 MG PO TB12: 600.0000 mg | ORAL_TABLET | Freq: Two times a day (BID) | ORAL | Status: DC | PRN

## 2021-10-10 MED ORDER — METOPROLOL TARTRATE 25 MG PO TABS: 25.0000 mg | ORAL_TABLET | Freq: Two times a day (BID) | ORAL | 1 refills | Status: DC

## 2021-10-10 MED ORDER — OXYCODONE HCL 5 MG PO TABS: 5.0000 mg | ORAL_TABLET | Freq: Four times a day (QID) | ORAL | 0 refills | Status: DC | PRN

## 2021-10-10 MED ORDER — ASPIRIN 81 MG PO TBEC: 81.0000 mg | DELAYED_RELEASE_TABLET | Freq: Every day | ORAL | 11 refills | Status: AC

## 2021-10-10 MED ORDER — AMIODARONE HCL 200 MG PO TABS: 200.0000 mg | ORAL_TABLET | Freq: Two times a day (BID) | ORAL | 1 refills | Status: DC

## 2021-10-10 MED ORDER — ATORVASTATIN CALCIUM 20 MG PO TABS: 20.0000 mg | ORAL_TABLET | Freq: Every day | ORAL | 1 refills | Status: DC

## 2021-10-10 MED ORDER — POTASSIUM CHLORIDE CRYS ER 20 MEQ PO TBCR: 20.0000 meq | EXTENDED_RELEASE_TABLET | Freq: Every day | ORAL | 0 refills | Status: DC

## 2021-10-10 MED ORDER — FUROSEMIDE 40 MG PO TABS: 40.0000 mg | ORAL_TABLET | Freq: Every day | ORAL | 0 refills | Status: DC

## 2021-10-10 MED ORDER — CEPHALEXIN 500 MG PO CAPS: 500.0000 mg | ORAL_CAPSULE | Freq: Three times a day (TID) | ORAL | 0 refills | Status: DC

## 2021-10-10 MED ORDER — GUAIFENESIN ER 600 MG PO TB12
600.0000 mg | ORAL_TABLET | Freq: Two times a day (BID) | ORAL | Status: DC
  Administered 2021-10-10: 600 mg via ORAL
  Filled 2021-10-10: qty 1

## 2021-10-10 MED ORDER — CEPHALEXIN 500 MG PO CAPS
500.0000 mg | ORAL_CAPSULE | Freq: Three times a day (TID) | ORAL | Status: DC
Start: 2021-10-10 — End: 2021-10-10
  Administered 2021-10-10: 500 mg via ORAL
  Filled 2021-10-10: qty 1

## 2021-10-10 NOTE — Plan of Care (Signed)
°  Problem: Activity: Goal: Risk for activity intolerance will decrease Outcome: Adequate for Discharge   Problem: Cardiac: Goal: Will achieve and/or maintain hemodynamic stability Outcome: Adequate for Discharge   Problem: Clinical Measurements: Goal: Postoperative complications will be avoided or minimized Outcome: Adequate for Discharge   Problem: Education: Goal: Knowledge of General Education information will improve Description: Including pain rating scale, medication(s)/side effects and non-pharmacologic comfort measures Outcome: Adequate for Discharge   Problem: Nutrition: Goal: Adequate nutrition will be maintained Outcome: Adequate for Discharge   Problem: Coping: Goal: Level of anxiety will decrease Outcome: Adequate for Discharge   Problem: Elimination: Goal: Will not experience complications related to bowel motility Outcome: Adequate for Discharge Goal: Will not experience complications related to urinary retention Outcome: Adequate for Discharge   Problem: Pain Managment: Goal: General experience of comfort will improve Outcome: Adequate for Discharge   Problem: Safety: Goal: Ability to remain free from injury will improve Outcome: Adequate for Discharge

## 2021-10-10 NOTE — Progress Notes (Signed)
Patient given discharge instructions and stated understanding. 

## 2021-10-10 NOTE — Progress Notes (Addendum)
° °   °  [address]       [place] [address]             [phone]        6 Days Post-Op Procedure(s) (LRB): BENTALL PROCEDURE USING 25 MM KONNECT RESILIA  AORTIC VALVED CONDUIT (N/A) TRANSESOPHAGEAL ECHOCARDIOGRAM (TEE) (N/A) ENDOVEIN HARVEST OF GREATER SAPHENOUS VEIN (Right) CORONARY ARTERY BYPASS GRAFTING (CABG) TIMES ONE USING RIGHT GREATER SAPHENOUS VEIN (N/A)  Subjective: Patient requesting Mucinex to help with sputum, which he said is mostly clear.  Objective: Vital signs in last 24 hours: Temp:  [97.7 F (36.5 C)-98.9 F (37.2 C)] 98.6 F (37 C) (02/14 0407) Pulse Rate:  [67-76] 67 (02/14 0407) Cardiac Rhythm: Normal sinus rhythm (02/13 1944) Resp:  [17-20] 20 (02/14 0407) BP: (113-149)/(65-87) 113/65 (02/14 0407) SpO2:  [94 %-98 %] 94 % (02/14 0407)  Pre op weight 88.9 kg Current Weight  10/07/21 92.5 kg      Intake/Output from previous day: 02/13 0701 - 02/14 0700 In: 618 [P.O.:618] Out: 750 [Urine:750]   Physical Exam:  Cardiovascular: RRR Pulmonary: Slightly diminished bibasilar breath sounds Abdomen: Soft, non tender, bowel sounds present. Extremities: Mild bilateral lower extremity edema. Wounds: Sternal and RLE wounds are clean and dry.  No erythema or signs of infection. IV site right lateral forearm has some erythema and is tender (IV Amiodarone infused through this). Chest tube wound open;no sign of infection  Lab Results: CBC: Recent Labs    10/09/21 0151 10/10/21 0220  WBC 9.8 9.0  HGB 8.5* 8.4*  HCT 26.7* 26.6*  PLT 191 231    BMET:  Recent Labs    10/09/21 0151 10/10/21 0220  NA 135 136  K 4.4 4.2  CL 98 101  CO2 27 25  GLUCOSE 107* 118*  BUN 21 20  CREATININE 1.17 1.23  CALCIUM 8.4* 8.5*     PT/INR:  Lab Results  Component Value Date   INR 1.2 10/04/2021   INR 1.0 10/02/2021   INR 1.0 06/08/2020   ABG:  INR: Will add last result for INR, ABG once components are confirmed Will add last 4  CBG results once components are confirmed  Assessment/Plan:  1. CV - A fib with RVR 02/12. Maintaining SR, first degree heart block. On Amiodarone 400 mg bid, Lopressor 25 mg bid, Xarelto 20 mg at supper. Will decrease Amiodarone at discharge 2.  Pulmonary - On room air. Will give Mucinex. Encourage incentive spirometer. 3. Volume Overload - On Lasix 40 mg daily 4.  Expected post op acute blood loss anemia - H and H this am stable at 8.4 and 26.6 6. History of BPH-continue Flomax 7. Phlebitis right lateral forearm with erythema, tenderness-will give Keflex 8. Discharge  Sharalyn Ink [facility] 10/10/2021,6:57 AM   Patient seen and examined, agree with above Keflex for phlebitis right arm Home today He knows to call immediately if right arm redness worsens  Remo Lipps C. Roxan Hockey, MD [facility] [phone]

## 2021-10-10 NOTE — Progress Notes (Signed)
Discussed with pt IS, sternal precautions, exercise, and CRPII. Pt receptive. Will refer to [facility].  [id number] Yves Dill CES, ACSM 10:07 AM 10/10/2021

## 2021-10-11 ENCOUNTER — Telehealth: Payer: Self-pay | Admitting: *Deleted

## 2021-10-11 MED FILL — Sodium Chloride IV Soln 0.9%: INTRAVENOUS | Qty: 2000 | Status: AC

## 2021-10-11 MED FILL — Sodium Bicarbonate IV Soln 8.4%: INTRAVENOUS | Qty: 50 | Status: AC

## 2021-10-11 MED FILL — Electrolyte-R (PH 7.4) Solution: INTRAVENOUS | Qty: 3000 | Status: AC

## 2021-10-11 MED FILL — Mannitol IV Soln 20%: INTRAVENOUS | Qty: 500 | Status: AC

## 2021-10-11 MED FILL — Heparin Sodium (Porcine) Inj 1000 Unit/ML: INTRAMUSCULAR | Qty: 20 | Status: AC

## 2021-10-11 MED FILL — Lidocaine HCl Local Preservative Free (PF) Inj 2%: INTRAMUSCULAR | Qty: 15 | Status: AC

## 2021-10-11 MED FILL — Potassium Chloride Inj 2 mEq/ML: INTRAVENOUS | Qty: 20 | Status: AC

## 2021-10-11 NOTE — Telephone Encounter (Signed)
Returned call placed to answering service last night. Per Larkin Ina, patient's son, patient was discharged home from the hospital yesterday. Patient's wife wasn't feeling well and tested positive for covid last night. Family with concerns about patient contracting covid. Larkin Ina reports speaking with on call provider last night. Advised patient to try to limit contact between patient and wife and wear masks when able. Both son and patient have tested negative for covid. Clementeen Graham to contact patient's PCP if patient begins to become symptomatic or report to an urgent care or ED if he experiences severe respiratory symptoms. Son verbalizes understanding.

## 2021-10-17 ENCOUNTER — Telehealth (HOSPITAL_COMMUNITY): Payer: Self-pay

## 2021-10-17 NOTE — Telephone Encounter (Signed)
Per phase I cardiac rehab, fax cardiac rehab referral to [facility] cardiac rehab.

## 2021-10-18 ENCOUNTER — Telehealth: Payer: Self-pay | Admitting: Cardiovascular Disease

## 2021-10-18 ENCOUNTER — Telehealth: Payer: Self-pay | Admitting: *Deleted

## 2021-10-18 NOTE — Telephone Encounter (Signed)
Spoke with pt's wife, [name], [place] who reports pt had CABG procedure 2 weeks ago.  She reports pt's surgical pain was improving but has worsened since last night after a "coughing spell". Pt is taking Mucinex for productive cough of clear mucous.  Pt is sleeping more as well.  Pt's wife advised to contact Dr Hendrickson's office who did the surgery due to pain improving bur now worse.  Pt's wife verbalizes understanding and agrees with current plan.

## 2021-10-18 NOTE — Telephone Encounter (Signed)
Patient's wife states she is concerned because the patient is 2 weeks post-op from procedure with Dr. Roxan Hockey and his chest is still very sore. She states he has also been extremely cold and his hands and feet are cold to touch. She would like to know if this is normal and how to proceed.

## 2021-10-18 NOTE — Telephone Encounter (Signed)
Patient's wife called stating patient has experienced increasing pain with cough starting last night to today. Per wife, patient didn't sleep well last night r/t pain and on going cough. Wife states patient is using pillow to brace when coughing. Patient's wife states she and her son tested positive for covid last week, thus far patient remains negative. States patient's cough is productive. Denies increasing SOB or fever. Most recent pulse ox was 97% on RA. Patient has only been utilizing Tylenol for pain. Last dose of oxycodone was over the weekend. Advised wife that patient may take oxycodone as prescribed for severe pain. Advised wife patient may take mucinex for cough as instructed on discharge instructions. Advised wife to call if they wish to get a chest xray for ongoing cough. Wife verbalized understanding.

## 2021-10-19 NOTE — Progress Notes (Signed)
Cardiology Office Note    Date:  10/25/2021   ID:  Adrian Franco, DOB [birth year], MRN [id number]   PCP:  [name], [place]  Cardiologist:  Jenkins Rouge, MD   Advanced Practice Provider:  No care team member to display Electrophysiologist:  None   [phone]   Chief Complaint  Patient presents with   Hospitalization Follow-up    History of Present Illness:  Adrian Franco is a 76 y.o. male with a History of Hypertension, Hyperlipidemia, DVT, PE,aortic insufficiency, ascending aneurysm, sleep apnea, arthritis, BPH, back pain and depression,   Patient developed severe AI and underwent CABG x1 and biologic bentall Aortic valve conduit 10/04/21. He had post op Afib converted to NSR with IV Amio. Also AKI and irbesartan stopped.  Patient walking around the house 15 min at day. Breathing ok. Coughing clear phlegm and hurts his chest. Mucinex helped. No palpitations, edema.      Past Medical History:  Diagnosis Date   Aneurysm ([place])    Anxiety    Aortic aneurysm (H[facility])    followed by Dr Johnsie Cancel   Arthritis    Atypical chest pain    BPH (benign prostatic hypertrophy)    Chronic back pain    Depression    DVT (deep venous thrombosis) ([facility])    GERD (gastroesophageal reflux disease)    History of kidney stones    HTN (hypertension)    Hyperlipemia    Hypogonadism in male    Insomnia    Localized edema    PE (pulmonary embolism)    Renal insufficiency    Restless leg syndrome    Sleep apnea    severe OSA-cannot tolerate CPAP   SOB (shortness of breath)    Testicular hypofunction     Past Surgical History:  Procedure Laterality Date   BENTALL PROCEDURE N/A 10/04/2021   Procedure: BENTALL PROCEDURE USING 25 MM KONNECT RESILIA  AORTIC VALVED CONDUIT;  Surgeon: Melrose Nakayama, MD;  Location: [place];  Service: Open Heart Surgery;  Laterality: N/A;   CORONARY ARTERY BYPASS GRAFT N/A 10/04/2021   Procedure: CORONARY ARTERY BYPASS  GRAFTING (CABG) TIMES ONE USING RIGHT GREATER SAPHENOUS VEIN;  Surgeon: Melrose Nakayama, MD;  Location: [place];  Service: Open Heart Surgery;  Laterality: N/A;   DRUG INDUCED ENDOSCOPY N/A 12/09/2019   Procedure: DRUG INDUCED ENDOSCOPY;  Surgeon: Melida Quitter, MD;  Location: [place];  Service: ENT;  Laterality: N/A;   ENDOVEIN HARVEST OF GREATER SAPHENOUS VEIN Right 10/04/2021   Procedure: ENDOVEIN HARVEST OF GREATER SAPHENOUS VEIN;  Surgeon: Melrose Nakayama, MD;  Location: [place];  Service: Open Heart Surgery;  Laterality: Right;   IMPLANTATION OF HYPOGLOSSAL NERVE STIMULATOR Right 02/17/2020   Procedure: IMPLANTATION OF HYPOGLOSSAL NERVE STIMULATOR;  Surgeon: Melida Quitter, MD;  Location: [place];  Service: ENT;  Laterality: Right;   INGUINAL HERNIA REPAIR     KIDNEY STONE SURGERY     stent   REVERSE SHOULDER ARTHROPLASTY Left 03/08/2021   Procedure: REVERSE SHOULDER ARTHROPLASTY;  Surgeon: Hiram Gash, MD;  Location: [facility];  Service: Orthopedics;  Laterality: Left;   RIGHT/LEFT HEART CATH AND CORONARY ANGIOGRAPHY N/A 09/01/2021   Procedure: RIGHT/LEFT HEART CATH AND CORONARY ANGIOGRAPHY;  Surgeon: Jettie Booze, MD;  Location: [place] [facility];  Service: Cardiovascular;  Laterality: N/A;   ROTATOR CUFF REPAIR Right    TEE WITHOUT CARDIOVERSION N/A 09/01/2021   Procedure: TRANSESOPHAGEAL ECHOCARDIOGRAM (TEE);  Surgeon: Josue Hector, MD;  Location: [facility] END[facility];  Service: Cardiovascular;  Laterality: N/A;   TEE WITHOUT CARDIOVERSION N/A 10/04/2021   Procedure: TRANSESOPHAGEAL ECHOCARDIOGRAM (TEE);  Surgeon: Melrose Nakayama, MD;  Location: [place];  Service: Open Heart Surgery;  Laterality: N/A;    Current Medications: Current Meds  Medication Sig   acetaminophen (TYLENOL) 500 MG tablet Take 1,000 mg by mouth every 8 (eight) hours as needed for moderate pain.   Ascorbic Acid (VITAMIN C) 1000 MG tablet Take 1,000 mg by mouth in the morning.   aspirin EC 81  MG EC tablet Take 1 tablet (81 mg total) by mouth daily. Swallow whole.   atorvastatin (LIPITOR) 20 MG tablet Take 1 tablet (20 mg total) by mouth daily.   Cholecalciferol (VITAMIN D) 50 MCG (2000 UT) tablet Take 2,000 Units by mouth daily.   Collagen-Vitamin C (SUPER COLLAGEN PLUS VITAMIN C PO) Take 1 tablet by mouth daily.   Cyanocobalamin (B-12) 5000 MCG CAPS Take 5,000 mcg by mouth daily.   docusate sodium (COLACE) 100 MG capsule Take 100 mg by mouth daily as needed for moderate constipation.   guaiFENesin (MUCINEX) 600 MG 12 hr tablet Take 1 tablet (600 mg total) by mouth 2 (two) times daily as needed for to loosen phlegm or cough.   LORazepam (ATIVAN) 0.5 MG tablet Take 0.5 mg by mouth at bedtime.   magnesium oxide (MAG-OX) 400 MG tablet Take 400 mg by mouth daily.   Multiple Vitamins-Minerals (ONE-A-DAY MENS 50+ ADVANTAGE) TABS Take 1 tablet by mouth daily with breakfast.   omeprazole (PRILOSEC) 40 MG capsule Take 40 mg by mouth daily.   oxyCODONE (OXY IR/ROXICODONE) 5 MG immediate release tablet Take 1 tablet (5 mg total) by mouth every 6 (six) hours as needed for severe pain.   oxymetazoline (AFRIN) 0.05 % nasal spray Place 1 spray into both nostrils at bedtime.   Polyvinyl Alcohol-Povidone (REFRESH OP) Place 1 drop into the left eye daily.   pregabalin (LYRICA) 50 MG capsule Take 50 mg by mouth 2 (two) times daily.   rivaroxaban (XARELTO) 20 MG TABS tablet Take 1 tablet (20 mg total) by mouth daily with supper.   rOPINIRole (REQUIP) 4 MG tablet Take 4 mg by mouth at bedtime.   tamsulosin (FLOMAX) 0.4 MG CAPS capsule Take 0.4 mg by mouth 2 (two) times daily.   Testosterone Cypionate 200 MG/ML SOLN Inject 200 mg into the muscle every 14 (fourteen) days.    tiZANidine (ZANAFLEX) 2 MG tablet Take 2 mg by mouth every 6 (six) hours as needed for muscle spasms.   [DISCONTINUED] amiodarone (PACERONE) 200 MG tablet Take 1 tablet (200 mg total) by mouth 2 (two) times daily. for one week;then take  200 mg daily thereafter   [DISCONTINUED] amiodarone (PACERONE) 200 MG tablet Take 200 mg by mouth daily.   [DISCONTINUED] metoprolol tartrate (LOPRESSOR) 25 MG tablet Take 1 tablet (25 mg total) by mouth 2 (two) times daily.     Allergies:   Sulfa antibiotics and Lotensin [benazepril hcl]   Social History   Socioeconomic History   Marital status: Married    Spouse name: Not on file   Number of children: Not on file   Years of education: Not on file   Highest education level: Not on file  Occupational History   Not on file  Tobacco Use   Smoking status: Never   Smokeless tobacco: Never  Vaping Use   Vaping Use: Never used  Substance and Sexual Activity  Alcohol use: No    Alcohol/week: 0.0 standard drinks   Drug use: No   Sexual activity: Yes    Comment: married  Other Topics Concern   Not on file  Social History Narrative   Not on file   Social Determinants of Health   Financial Resource Strain: Not on file  Food Insecurity: Not on file  Transportation Needs: Not on file  Physical Activity: Not on file  Stress: Not on file  Social Connections: Not on file     Family History:  The patient's  family history includes Healthy in his brother, sister, son, and son; Heart attack in his father; Hyperlipidemia in his father and mother; Other in his brother.   ROS:   Please see the history of present illness.    ROS All other systems reviewed and are negative.   PHYSICAL EXAM:   VS:  BP 104/70    Pulse 67    Ht 5\' 8"  (1.727 m)    Wt 193 lb 3.2 oz (87.6 kg)    SpO2 95%    BMI 29.38 kg/m   Physical Exam  GEN: Well nourished, well developed, in no acute distress  Neck: no JVD, carotid bruits, or masses Cardiac: Incision healing well RRR; no murmurs, rubs, or gallops  Respiratory: decreased breath sounds but  clear to auscultation bilaterally, normal work of breathing GI: soft, nontender, nondistended, + BS Ext: without cyanosis, clubbing, or edema, Good distal pulses  bilaterally MS: no deformity or atrophy  Skin: warm and dry, no rash Neuro:  Alert and Oriented x 3, Strength and sensation are intact Psych: euthymic mood, full affect  Wt Readings from Last 3 Encounters:  10/25/21 193 lb 3.2 oz (87.6 kg)  10/07/21 203 lb 14.8 oz (92.5 kg)  10/02/21 201 lb 12.8 oz (91.5 kg)      Studies/Labs Reviewed:   EKG:  EKG is not ordered today.     Recent Labs: 10/05/2021: Magnesium 2.6 10/08/2021: TSH 2.679 10/09/2021: ALT 78 10/10/2021: BUN 20; Creatinine, Ser 1.23; Hemoglobin 8.4; Platelets 231; Potassium 4.2; Sodium 136   Lipid Panel No results found for: CHOL, TRIG, HDL, CHOLHDL, VLDL, LDLCALC, LDLDIRECT  Additional studies/ records that were reviewed today include:      ECHO INTRAOPERATIVE TEE   Result Date: 10/04/2021  *INTRAOPERATIVE TRANSESOPHAGEAL REPORT *  Patient Name:   Adrian Franco Date of Exam: 10/04/2021 Medical Rec #:  [id number]        Height:       69.0 in Accession #:    [id number]       Weight:       196.0 lb Date of Birth:  [birth year]         BSA:          2.05 m Patient Age:    38 years         BP:           127/79 mmHg Patient Gender: M                HR:           55 bpm. Exam Location:  Anesthesiology Transesophogeal exam was perform intraoperatively during surgical procedure. Patient was closely monitored under general anesthesia during the entirety of examination. Indications:     Aortic Insufficiency i35.1 Sonographer:     Raquel Sarna Senior RDCS Performing Phys: [place] Diagnosing Phys: Duane Boston MD Complications: No known complications during this procedure. POST-OP IMPRESSIONS Overall, there  were no significant changes from pre-bypass. _ Aorta: A graft was placed in the ascending aorta for repair. _ Aortic Valve: A bovine bioprosthetic valve was placed, leaflets are freely mobile and leaflets thin Size; 62mm. No regurgitation post repair. No perivalvular leak noted. _ Comments: S/P aortic valve conduit insertion. Valve  and consuit in good position, valve funtioning normally. LV function good. Exam otherwise unchanged. PRE-OP FINDINGS  Left Ventricle: The left ventricle has normal systolic function, with an ejection fraction of 60-65%. The cavity size was normal. No evidence of left ventricular regional wall motion abnormalities. There is no left ventricular hypertrophy. Right Ventricle: The right ventricle has normal systolic function. The cavity was normal. There is no increase in right ventricular wall thickness. Left Atrium: Left atrial size was normal in size. No left atrial/left atrial appendage thrombus was detected. Right Atrium: Right atrial size was normal in size. Interatrial Septum: No atrial level shunt detected by color flow Doppler. Pericardium: There is no evidence of pericardial effusion. Mitral Valve: The mitral valve is normal in structure. Mitral valve regurgitation is trivial by color flow Doppler. There is No evidence of mitral stenosis. AI jet appears to impinge on the anterior leaflet of the MV. Tricuspid Valve: The tricuspid valve was normal in structure. Tricuspid valve regurgitation is trivial by color flow Doppler. No evidence of tricuspid stenosis is present. Aortic Valve: The aortic valve is tricuspid Aortic valve regurgitation is moderate by color flow Doppler. The jet is eccentric anteriorly directed. There is no stenosis of the aortic valve, with a calculated valve area of 1.75 cm. There is moderate aortic annular calcification noted. Pulmonic Valve: The pulmonic valve was normal in structure. Pulmonic valve regurgitation is trivial by color flow Doppler. Aorta: There is severe dilatation of the ascending aorta, measuring 44 mm. There is evidence of plaque in the descending aorta; Grade II, measuring 2-25mm in size. +--------------+--------++   Risk Assessment/Calculations:    CHA2DS2-VASc Score = 4   This indicates a 4.8% annual risk of stroke. The patient's score is based upon: CHF History:  0 HTN History: 1 Diabetes History: 0 Stroke History: 0 Vascular Disease History: 1 Age Score: 2 Gender Score: 0        ASSESSMENT:    1. Aortic valve insufficiency, etiology of cardiac valve disease unspecified   2. Coronary artery disease involving coronary bypass graft without angina pectoris, unspecified whether native or transplanted heart   3. Primary hypertension   4. Hyperlipidemia, unspecified hyperlipidemia type   5. OSA (obstructive sleep apnea)      PLAN:  In order of problems listed above:  S/p: CABG x 1 and biologic Bentall aortic valve conduit post op Afib converted with IV amiodarone.  Patient's main complaint is cough with chest pain has improved some with Mucinex.  Was not taking his oxycodone.  Sputum is clear.  Lungs are clear today.  Continue Mucinex and incentive spirometry.  Keep follow-up with surgeons in 2 weeks  HTN blood pressure on the low side currently on metoprolol 25 mg twice daily.  No dizziness.  Continue this dose and they will continue to monitor at home.  Irbesartan stopped in the hospital because of AKI renal was normal at discharge.  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  Concerns regarding medicines  are outlined above.  Medication changes, Labs and Tests ordered today are listed in the Patient Instructions below. Patient Instructions  Medication Instructions:  .Your physician recommends that you continue on your current medications as directed. Please refer to the Current Medication list given to you today.  *If you need a refill on your cardiac medications before your next appointment, please call your pharmacy*   Lab Work: None ordered  If you have labs (blood work) drawn today and your tests are completely normal, you will receive your  results only by: [facility] [place] (if you have MyChart) OR A paper copy in the mail If you have any lab test that is abnormal or we need to change your treatment, we will call you to review the results.   Testing/Procedures: None ordered   Follow-Up: At [facility], you and your health needs are our priority.  As part of our continuing mission to provide you with exceptional heart care, we have created designated Provider Care Teams.  These Care Teams include your primary Cardiologist (physician) and Advanced Practice Providers (APPs -  Physician Assistants and Nurse Practitioners) who all work together to provide you with the care you need, when you need it.  We recommend signing up for the patient portal called "MyChart".  Sign up information is provided on this After Visit Summary.  MyChart is used to connect with patients for Virtual Visits (Telemedicine).  Patients are able to view lab/test results, encounter notes, upcoming appointments, etc.  Non-urgent messages can be sent to your provider as well.   To learn more about what you can do with MyChart, go to [email or web address].    Your next appointment:   2-3 month(s)  01/25/22 ARRIVE AT 2:00  The format for your next appointment:   In Person  Provider:   Jenkins Rouge, MD    Other Instructions    Signed, Ermalinda Barrios, PA-C  10/25/2021 2:15 PM    [facility] [address], [place], [place]  [address] Phone: [phone]; Fax: [phone]

## 2021-10-25 ENCOUNTER — Other Ambulatory Visit: Payer: Self-pay

## 2021-10-25 ENCOUNTER — Ambulatory Visit (INDEPENDENT_AMBULATORY_CARE_PROVIDER_SITE_OTHER): Payer: Medicare Other | Admitting: Physician Assistant

## 2021-10-25 ENCOUNTER — Encounter: Payer: Self-pay | Admitting: Physician Assistant

## 2021-10-25 VITALS — BP 104/70 | HR 67 | Ht 68.0 in | Wt 193.2 lb

## 2021-10-25 DIAGNOSIS — E785 Hyperlipidemia, unspecified: Secondary | ICD-10-CM | POA: Diagnosis not present

## 2021-10-25 DIAGNOSIS — G4733 Obstructive sleep apnea (adult) (pediatric): Secondary | ICD-10-CM

## 2021-10-25 DIAGNOSIS — I351 Nonrheumatic aortic (valve) insufficiency: Secondary | ICD-10-CM | POA: Diagnosis not present

## 2021-10-25 DIAGNOSIS — I10 Essential (primary) hypertension: Secondary | ICD-10-CM

## 2021-10-25 DIAGNOSIS — I25810 Atherosclerosis of coronary artery bypass graft(s) without angina pectoris: Secondary | ICD-10-CM | POA: Diagnosis not present

## 2021-10-25 MED ORDER — AMIODARONE HCL 200 MG PO TABS: 200.0000 mg | ORAL_TABLET | Freq: Every day | ORAL | 3 refills | Status: DC

## 2021-10-25 MED ORDER — METOPROLOL TARTRATE 25 MG PO TABS: 25.0000 mg | ORAL_TABLET | Freq: Two times a day (BID) | ORAL | 3 refills | Status: DC

## 2021-10-25 NOTE — Patient Instructions (Addendum)
Medication Instructions:  ?.Your physician recommends that you continue on your current medications as directed. Please refer to the Current Medication list given to you today. ? ?*If you need a refill on your cardiac medications before your next appointment, please call your pharmacy* ? ? ?Lab Work: ?None ordered ? ?If you have labs (blood work) drawn today and your tests are completely normal, you will receive your results only by: ?MyChart Message (if you have MyChart) OR ?A paper copy in the mail ?If you have any lab test that is abnormal or we need to change your treatment, we will call you to review the results. ? ? ?Testing/Procedures: ?None ordered ? ? ?Follow-Up: ?At [facility], you and your health needs are our priority.  As part of our continuing mission to provide you with exceptional heart care, we have created designated Provider Care Teams.  These Care Teams include your primary Cardiologist (physician) and Advanced Practice Providers (APPs -  Physician Assistants and Nurse Practitioners) who all work together to provide you with the care you need, when you need it. ? ?We recommend signing up for the patient portal called "MyChart".  Sign up information is provided on this After Visit Summary.  MyChart is used to connect with patients for Virtual Visits (Telemedicine).  Patients are able to view lab/test results, encounter notes, upcoming appointments, etc.  Non-urgent messages can be sent to your provider as well.   ?To learn more about what you can do with MyChart, go to [email or web address].   ? ?Your next appointment:   ?2-3 month(s)  01/25/22 ARRIVE AT 2:00 ? ?The format for your next appointment:   ?In Person ? ?Provider:   ?Jenkins Rouge, MD  ? ? ?Other Instructions ? ?

## 2021-11-01 ENCOUNTER — Other Ambulatory Visit: Payer: Self-pay | Admitting: Physician Assistant

## 2021-11-06 ENCOUNTER — Other Ambulatory Visit: Payer: Self-pay | Admitting: Thoracic Surgery (Cardiothoracic Vascular Surgery)

## 2021-11-06 DIAGNOSIS — I351 Nonrheumatic aortic (valve) insufficiency: Secondary | ICD-10-CM

## 2021-11-07 ENCOUNTER — Ambulatory Visit
Admission: RE | Admit: 2021-11-07 | Discharge: 2021-11-07 | Disposition: A | Discharge status: Home / Self Care | Payer: Medicare Other | Source: Ambulatory Visit | Attending: Thoracic Surgery (Cardiothoracic Vascular Surgery) | Admitting: Thoracic Surgery (Cardiothoracic Vascular Surgery)

## 2021-11-07 ENCOUNTER — Ambulatory Visit (INDEPENDENT_AMBULATORY_CARE_PROVIDER_SITE_OTHER): Payer: Self-pay | Admitting: Thoracic Surgery (Cardiothoracic Vascular Surgery)

## 2021-11-07 ENCOUNTER — Encounter: Payer: Self-pay | Admitting: Thoracic Surgery (Cardiothoracic Vascular Surgery)

## 2021-11-07 ENCOUNTER — Other Ambulatory Visit: Payer: Self-pay

## 2021-11-07 VITALS — BP 112/71 | HR 66 | Resp 20 | Ht 68.0 in | Wt 186.0 lb

## 2021-11-07 DIAGNOSIS — I351 Nonrheumatic aortic (valve) insufficiency: Secondary | ICD-10-CM

## 2021-11-07 DIAGNOSIS — Z09 Encounter for follow-up examination after completed treatment for conditions other than malignant neoplasm: Secondary | ICD-10-CM

## 2021-11-07 DIAGNOSIS — I7121 Aneurysm of the ascending aorta, without rupture: Secondary | ICD-10-CM

## 2021-11-07 DIAGNOSIS — I2510 Atherosclerotic heart disease of native coronary artery without angina pectoris: Secondary | ICD-10-CM

## 2021-11-07 NOTE — Progress Notes (Signed)
? ?   ?[address] ?      [place] [address] ?            [phone]   ? ? ?HPI: Adrian Franco returns after recent aortic root replacement and bypass. ? ?Adrian Franco is a 76 year old man with a history of hypertension, hyperlipidemia, DVT, PE, aortic insufficiency, ascending aneurysm, sleep apnea, arthritis, BPH, chronic back pain, and depression.  He has been followed for aortic insufficiency since 2017.  Over the past year he had developed decreased energy, shortness of breath, dizzy spells.  Those had been increasing in frequency and severity. ? ?I did a bio Bentall and coronary bypass grafting x1 on 10/04/2021.  Postoperatively he had some atrial fibrillation.  He converted to sinus rhythm with amiodarone.  He also had phlebitis in his right forearm which was treated with Keflex. ? ?He feels well.  He has some pain to the sides of the sternal incision.  He is not taking any narcotics for that.  He had a severe cough for couple weeks.  That has improved significantly.  Anxious to increase his activities. ? ?Past Medical History:  ?Diagnosis Date  ? Aneurysm ([facility])   ? Anxiety   ? Aortic aneurysm ([facility])   ? followed by Dr Johnsie Cancel  ? Arthritis   ? Atypical chest pain   ? BPH (benign prostatic hypertrophy)   ? Chronic back pain   ? Depression   ? DVT (deep venous thrombosis) ([facility])   ? GERD (gastroesophageal reflux disease)   ? History of kidney stones   ? HTN (hypertension)   ? Hyperlipemia   ? Hypogonadism in male   ? Insomnia   ? Localized edema   ? PE (pulmonary embolism)   ? Renal insufficiency   ? Restless leg syndrome   ? Sleep apnea   ? severe OSA-cannot tolerate CPAP  ? SOB (shortness of breath)   ? Testicular hypofunction   ? ? ?Current Outpatient Medications  ?Medication Sig Dispense Refill  ? acetaminophen (TYLENOL) 500 MG tablet Take 1,000 mg by mouth every 8 (eight) hours as needed for moderate pain.    ? amiodarone (PACERONE) 200 MG tablet Take 1 tablet (200 mg total) by  mouth daily. 90 tablet 3  ? Ascorbic Acid (VITAMIN C) 1000 MG tablet Take 1,000 mg by mouth in the morning.    ? aspirin EC 81 MG EC tablet Take 1 tablet (81 mg total) by mouth daily. Swallow whole. 30 tablet 11  ? atorvastatin (LIPITOR) 20 MG tablet Take 1 tablet (20 mg total) by mouth daily. 30 tablet 1  ? Cholecalciferol (VITAMIN D) 50 MCG (2000 UT) tablet Take 2,000 Units by mouth daily.    ? Collagen-Vitamin C (SUPER COLLAGEN PLUS VITAMIN C PO) Take 1 tablet by mouth daily.    ? Cyanocobalamin (B-12) 5000 MCG CAPS Take 5,000 mcg by mouth daily.    ? docusate sodium (COLACE) 100 MG capsule Take 100 mg by mouth daily as needed for moderate constipation.    ? guaiFENesin (MUCINEX) 600 MG 12 hr tablet Take 1 tablet (600 mg total) by mouth 2 (two) times daily as needed for to loosen phlegm or cough.    ? LORazepam (ATIVAN) 0.5 MG tablet Take 0.5 mg by mouth at bedtime.    ? magnesium oxide (MAG-OX) 400 MG tablet Take 400 mg by mouth daily.    ? metoprolol tartrate (LOPRESSOR) 25 MG tablet Take 1 tablet (25 mg total) by  mouth 2 (two) times daily. 180 tablet 3  ? Multiple Vitamins-Minerals (ONE-A-DAY MENS 50+ ADVANTAGE) TABS Take 1 tablet by mouth daily with breakfast.    ? omeprazole (PRILOSEC) 40 MG capsule Take 40 mg by mouth daily.    ? oxyCODONE (OXY IR/ROXICODONE) 5 MG immediate release tablet Take 1 tablet (5 mg total) by mouth every 6 (six) hours as needed for severe pain. 24 tablet 0  ? oxymetazoline (AFRIN) 0.05 % nasal spray Place 1 spray into both nostrils at bedtime.    ? Polyvinyl Alcohol-Povidone (REFRESH OP) Place 1 drop into the left eye daily.    ? pregabalin (LYRICA) 50 MG capsule Take 50 mg by mouth 2 (two) times daily.    ? rivaroxaban (XARELTO) 20 MG TABS tablet Take 1 tablet (20 mg total) by mouth daily with supper. 90 tablet 3  ? rOPINIRole (REQUIP) 4 MG tablet Take 4 mg by mouth at bedtime.    ? tamsulosin (FLOMAX) 0.4 MG CAPS capsule Take 0.4 mg by mouth 2 (two) times daily.    ? Testosterone  Cypionate 200 MG/ML SOLN Inject 200 mg into the muscle every 14 (fourteen) days.     ? tiZANidine (ZANAFLEX) 2 MG tablet Take 2 mg by mouth every 6 (six) hours as needed for muscle spasms.    ? ?No current facility-administered medications for this visit.  ? ? ?Physical Exam ?BP 112/71   Pulse 66   Resp 20   Ht 5\' 8"  (1.727 m)   Wt 186 lb (84.4 kg)   SpO2 97% Comment: RA  BMI 28.6 kg/m?  ?76 year old man in no acute distress ?Alert and oriented x3 with no focal deficits ?Cardiac regular rate and rhythm with faint systolic murmur ?Lungs clear with equal breath sounds bilaterally ?Sternum stable, incision well-healed ?Leg incision well-healed, no peripheral edema ? ?Diagnostic Tests: ?CHEST - 2 VIEW ?  ?COMPARISON:  Radiographs 10/09/2021 and 10/07/2021. ?  ?FINDINGS: ?The heart size and mediastinal contours are stable status post ?median sternotomy and aortic valve replacement. The previously ?demonstrated pleural effusions have nearly resolved. The lungs are ?clear. There is no pneumothorax. Right hypo glossal nerve stimulator ?and previous left shoulder reverse arthroplasty are noted. No acute ?osseous findings are seen. ?  ?IMPRESSION: ?Improved pleural effusions post aortic valve replacement. No acute ?cardiopulmonary process. ?  ?  ?Electronically Signed ?  By: Richardean Sale M.D. ?  On: 11/07/2021 10:35 ?I personally reviewed the chest x-ray images.  Postop changes present. ? 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  Postoperatively he had atrial fibrillation which converted to sinus rhythm with amiodarone.  He also had cellulitis in his forearm from an IV site that resolved with Keflex. ? ?Currently he is feeling well.  He is anxious to increase his activities.  He is not having any chest pain or shortness of breath.  He is not had any dizziness.  He had a bad cough for a few weeks but that is improving  currently. ? ?Postoperative atrial fibrillation-in sinus rhythm today.  On amiodarone 200 mg twice daily and metoprolol.  Will defer to [name] as to when to discontinue the amiodarone. ? ?He may begin driving.  Should not lift anything over 10 pounds for another week and nothing over 25 pounds for another 3 weeks. ? ?Plan: ?Follow-up with [name] ?I will be happy to see Mr. Beamer back anytime in the  future if I can be of any further assistance with his care ? ?Melrose Nakayama, MD ?[facility] ?[phone] ? ? ? ? ?

## 2021-11-09 ENCOUNTER — Other Ambulatory Visit: Payer: Self-pay

## 2021-11-09 MED ORDER — ATORVASTATIN CALCIUM 20 MG PO TABS: 20.0000 mg | ORAL_TABLET | Freq: Every day | ORAL | 3 refills | Status: DC

## 2021-11-09 NOTE — Telephone Encounter (Signed)
Pt's medication was sent to pt's pharmacy as requested. Confirmation received.  °

## 2021-11-14 ENCOUNTER — Other Ambulatory Visit: Payer: Self-pay

## 2021-11-14 ENCOUNTER — Ambulatory Visit (HOSPITAL_COMMUNITY): Payer: Medicare Other | Attending: Cardiology

## 2021-11-14 DIAGNOSIS — I350 Nonrheumatic aortic (valve) stenosis: Secondary | ICD-10-CM | POA: Diagnosis present

## 2021-11-14 LAB — ECHOCARDIOGRAM COMPLETE
AV Mean grad: 5.6 mmHg
AV Peak grad: 9.9 mmHg
Ao pk vel: 1.57 m/s
Area-P 1/2: 3.34 cm2
S' Lateral: 3.1 cm

## 2021-12-28 ENCOUNTER — Encounter: Payer: Self-pay | Admitting: Cardiovascular Disease

## 2021-12-28 MED ORDER — METOPROLOL TARTRATE 25 MG PO TABS: 12.5000 mg | ORAL_TABLET | Freq: Two times a day (BID) | ORAL | 3 refills | Status: DC

## 2021-12-28 NOTE — Telephone Encounter (Signed)
Called patient about his message and Dr. Kyla Balzarine response. Per [name] discontinue amiodarone and decrease lopressor to 12.5 mg bid. Patient verbalized understanding. ? ? ?

## 2022-01-09 ENCOUNTER — Encounter: Payer: Self-pay | Admitting: Cardiovascular Disease

## 2022-01-09 NOTE — Telephone Encounter (Signed)
Called patient and informed him of Dr. Kyla Balzarine recommendations. Patient had recent echo that was fine. Encouraged patient to call his PCP about his symptoms to see if there is something else causing him to feel tired and all. Informed patient that he might just still be recovering from his surgery. ?

## 2022-01-17 NOTE — Progress Notes (Signed)
Cardiology Office Note    Date:  01/25/2022   ID:  Adrian Franco, DOB 03/21/1946, MRN [id number]   PCP:  [name], [place]  Cardiologist:  Jenkins Rouge, MD   Advanced Practice Provider:  No care team member to display Electrophysiologist:  None   [phone]   No chief complaint on file.   History of Present Illness:  Adrian Franco is a 76 y.o. male with a History of Hypertension, Hyperlipidemia, DVT, PE,aortic insufficiency, ascending aneurysm, sleep apnea, arthritis, BPH, back pain and depression,   Patient developed severe AI and underwent CABG x1 and biologic bentall Aortic valve conduit 10/04/21. He had post op Afib converted to NSR with IV Amio. Also AKI and irbesartan stopped.  March had fatigue and malaise Amiodarone d/c and lopressor decreased Dizziness improved Has had issues with left knee with bloody effusion seeing Dr French Ana       Past Medical History:  Diagnosis Date   Aneurysm ([facility])    Anxiety    Aortic aneurysm ([facility] [facility])    followed by Dr Johnsie Cancel   Arthritis    Atypical chest pain    BPH (benign prostatic hypertrophy)    Chronic back pain    Depression    DVT (deep venous thrombosis) ([facility])    GERD (gastroesophageal reflux disease)    History of kidney stones    HTN (hypertension)    Hyperlipemia    Hypogonadism in male    Insomnia    Localized edema    PE (pulmonary embolism)    Renal insufficiency    Restless leg syndrome    Sleep apnea    severe OSA-cannot tolerate CPAP   SOB (shortness of breath)    Testicular hypofunction     Past Surgical History:  Procedure Laterality Date   BENTALL PROCEDURE N/A 10/04/2021   Procedure: BENTALL PROCEDURE USING 25 MM KONNECT RESILIA  AORTIC VALVED CONDUIT;  Surgeon: Melrose Nakayama, MD;  Location: [facility];  Service: Open Heart Surgery;  Laterality: N/A;   CORONARY ARTERY BYPASS GRAFT N/A 10/04/2021   Procedure: CORONARY ARTERY BYPASS GRAFTING (CABG) TIMES ONE  USING RIGHT GREATER SAPHENOUS VEIN;  Surgeon: Melrose Nakayama, MD;  Location: [facility];  Service: Open Heart Surgery;  Laterality: N/A;   DRUG INDUCED ENDOSCOPY N/A 12/09/2019   Procedure: DRUG INDUCED ENDOSCOPY;  Surgeon: Melida Quitter, MD;  Location: [facility];  Service: ENT;  Laterality: N/A;   ENDOVEIN HARVEST OF GREATER SAPHENOUS VEIN Right 10/04/2021   Procedure: ENDOVEIN HARVEST OF GREATER SAPHENOUS VEIN;  Surgeon: Melrose Nakayama, MD;  Location: [facility];  Service: Open Heart Surgery;  Laterality: Right;   IMPLANTATION OF HYPOGLOSSAL NERVE STIMULATOR Right 02/17/2020   Procedure: IMPLANTATION OF HYPOGLOSSAL NERVE STIMULATOR;  Surgeon: Melida Quitter, MD;  Location: [facility];  Service: ENT;  Laterality: Right;   INGUINAL HERNIA REPAIR     KIDNEY STONE SURGERY     stent   REVERSE SHOULDER ARTHROPLASTY Left 03/08/2021   Procedure: REVERSE SHOULDER ARTHROPLASTY;  Surgeon: Hiram Gash, MD;  Location: [facility];  Service: Orthopedics;  Laterality: Left;   RIGHT/LEFT HEART CATH AND CORONARY ANGIOGRAPHY N/A 09/01/2021   Procedure: RIGHT/LEFT HEART CATH AND CORONARY ANGIOGRAPHY;  Surgeon: Jettie Booze, MD;  Location: [facility];  Service: Cardiovascular;  Laterality: N/A;   ROTATOR CUFF REPAIR Right    TEE WITHOUT CARDIOVERSION N/A 09/01/2021   Procedure: TRANSESOPHAGEAL ECHOCARDIOGRAM (TEE);  Surgeon: Jenkins Rouge  C, MD;  Location: [facility];  Service: Cardiovascular;  Laterality: N/A;   TEE WITHOUT CARDIOVERSION N/A 10/04/2021   Procedure: TRANSESOPHAGEAL ECHOCARDIOGRAM (TEE);  Surgeon: Melrose Nakayama, MD;  Location: [facility];  Service: Open Heart Surgery;  Laterality: N/A;    Current Medications: Current Meds  Medication Sig   acetaminophen (TYLENOL) 500 MG tablet Take 1,000 mg by mouth every 8 (eight) hours as needed for moderate pain.   Ascorbic Acid (VITAMIN C) 1000 MG tablet Take 1,000 mg by mouth in the morning.   aspirin EC 81 MG EC tablet Take 1 tablet  (81 mg total) by mouth daily. Swallow whole.   atorvastatin (LIPITOR) 20 MG tablet Take 1 tablet (20 mg total) by mouth daily.   Cholecalciferol (VITAMIN D) 50 MCG (2000 UT) tablet Take 2,000 Units by mouth daily.   Collagen-Vitamin C (SUPER COLLAGEN PLUS VITAMIN C PO) Take 1 tablet by mouth daily.   Cyanocobalamin (B-12) 5000 MCG CAPS Take 5,000 mcg by mouth daily.   docusate sodium (COLACE) 100 MG capsule Take 100 mg by mouth daily as needed for moderate constipation.   LORazepam (ATIVAN) 0.5 MG tablet Take 0.5 mg by mouth at bedtime.   magnesium oxide (MAG-OX) 400 MG tablet Take 400 mg by mouth daily.   metoprolol tartrate (LOPRESSOR) 25 MG tablet Take 0.5 tablets (12.5 mg total) by mouth 2 (two) times daily.   Multiple Vitamins-Minerals (ONE-A-DAY MENS 50+ ADVANTAGE) TABS Take 1 tablet by mouth daily with breakfast.   omeprazole (PRILOSEC) 40 MG capsule Take 40 mg by mouth daily.   oxymetazoline (AFRIN) 0.05 % nasal spray Place 1 spray into both nostrils at bedtime.   Polyvinyl Alcohol-Povidone (REFRESH OP) Place 1 drop into the left eye daily.   pregabalin (LYRICA) 50 MG capsule Take 50 mg by mouth 2 (two) times daily.   rivaroxaban (XARELTO) 20 MG TABS tablet Take 1 tablet (20 mg total) by mouth daily with supper.   rOPINIRole (REQUIP) 4 MG tablet Take 4 mg by mouth at bedtime.   tamsulosin (FLOMAX) 0.4 MG CAPS capsule Take 0.4 mg by mouth 2 (two) times daily.   Testosterone Cypionate 200 MG/ML SOLN Inject 200 mg into the muscle every 14 (fourteen) days.    tiZANidine (ZANAFLEX) 2 MG tablet Take 2 mg by mouth every 6 (six) hours as needed for muscle spasms.     Allergies:   Sulfa antibiotics and Lotensin [benazepril hcl]   Social History   Socioeconomic History   Marital status: Married    Spouse name: Not on file   Number of children: Not on file   Years of education: Not on file   Highest education level: Not on file  Occupational History   Not on file  Tobacco Use   Smoking  status: Never   Smokeless tobacco: Never  Vaping Use   Vaping Use: Never used  Substance and Sexual Activity   Alcohol use: No    Alcohol/week: 0.0 standard drinks   Drug use: No   Sexual activity: Yes    Comment: married  Other Topics Concern   Not on file  Social History Narrative   Not on file   Social Determinants of Health   Financial Resource Strain: Not on file  Food Insecurity: Not on file  Transportation Needs: Not on file  Physical Activity: Not on file  Stress: Not on file  Social Connections: Not on file     Family History:  The patient's  family history includes Healthy  in his brother, sister, son, and son; Heart attack in his father; Hyperlipidemia in his father and mother; Other in his brother.   ROS:   Please see the history of present illness.    ROS All other systems reviewed and are negative.   PHYSICAL EXAM:   VS:  BP 112/62   Pulse 80   Ht 5\' 8"  (1.727 m)   Wt 202 lb (91.6 kg)   SpO2 98%   BMI 30.71 kg/m   Physical Exam  GEN: Well nourished, well developed, in no acute distress  Neck: no JVD, carotid bruits, or masses Cardiac: Incision healing well RRR; no murmurs, rubs, or gallops  Respiratory: decreased breath sounds but  clear to auscultation bilaterally, normal work of breathing GI: soft, nontender, nondistended, + BS Ext: without cyanosis, clubbing, or edema, Good distal pulses bilaterally MS: no deformity or atrophy  Skin: warm and dry, no rash Neuro:  Alert and Oriented x 3, Strength and sensation are intact Psych: euthymic mood, full affect  Wt Readings from Last 3 Encounters:  01/25/22 202 lb (91.6 kg)  11/07/21 186 lb (84.4 kg)  10/25/21 193 lb 3.2 oz (87.6 kg)      Studies/Labs Reviewed:   EKG:  EKG is not ordered today.     Recent Labs: 10/05/2021: Magnesium 2.6 10/08/2021: TSH 2.679 10/09/2021: ALT 78 10/10/2021: BUN 20; Creatinine, Ser 1.23; Hemoglobin 8.4; Platelets 231; Potassium 4.2; Sodium 136   Lipid Panel No  results found for: CHOL, TRIG, HDL, CHOLHDL, VLDL, LDLCALC, LDLDIRECT  Additional studies/ records that were reviewed today include:      ECHO INTRAOPERATIVE TEE   Result Date: 10/04/2021  *INTRAOPERATIVE TRANSESOPHAGEAL REPORT *  Patient Name:   Adrian Franco Date of Exam: 10/04/2021 Medical Rec #:  [id number]        Height:       69.0 in Accession #:    [id number]       Weight:       196.0 lb Date of Birth:  1946/03/21         BSA:          2.05 m Patient Age:    34 years         BP:           127/79 mmHg Patient Gender: M                HR:           55 bpm. Exam Location:  Anesthesiology Transesophogeal exam was perform intraoperatively during surgical procedure. Patient was closely monitored under general anesthesia during the entirety of examination. Indications:     Aortic Insufficiency i35.1 Sonographer:     Raquel Sarna Senior RDCS Performing Phys: [place] Diagnosing Phys: Duane Boston MD Complications: No known complications during this procedure. POST-OP IMPRESSIONS Overall, there were no significant changes from pre-bypass. _ Aorta: A graft was placed in the ascending aorta for repair. _ Aortic Valve: A bovine bioprosthetic valve was placed, leaflets are freely mobile and leaflets thin Size; 78mm. No regurgitation post repair. No perivalvular leak noted. _ Comments: S/P aortic valve conduit insertion. Valve and consuit in good position, valve funtioning normally. LV function good. Exam otherwise unchanged. PRE-OP FINDINGS  Left Ventricle: The left ventricle has normal systolic function, with an ejection fraction of 60-65%. The cavity size was normal. No evidence of left ventricular regional wall motion abnormalities. There is no left ventricular hypertrophy. Right Ventricle: The right ventricle has normal  systolic function. The cavity was normal. There is no increase in right ventricular wall thickness. Left Atrium: Left atrial size was normal in size. No left atrial/left atrial appendage  thrombus was detected. Right Atrium: Right atrial size was normal in size. Interatrial Septum: No atrial level shunt detected by color flow Doppler. Pericardium: There is no evidence of pericardial effusion. Mitral Valve: The mitral valve is normal in structure. Mitral valve regurgitation is trivial by color flow Doppler. There is No evidence of mitral stenosis. AI jet appears to impinge on the anterior leaflet of the MV. Tricuspid Valve: The tricuspid valve was normal in structure. Tricuspid valve regurgitation is trivial by color flow Doppler. No evidence of tricuspid stenosis is present. Aortic Valve: The aortic valve is tricuspid Aortic valve regurgitation is moderate by color flow Doppler. The jet is eccentric anteriorly directed. There is no stenosis of the aortic valve, with a calculated valve area of 1.75 cm. There is moderate aortic annular calcification noted. Pulmonic Valve: The pulmonic valve was normal in structure. Pulmonic valve regurgitation is trivial by color flow Doppler. Aorta: There is severe dilatation of the ascending aorta, measuring 44 mm. There is evidence of plaque in the descending aorta; Grade II, measuring 2-76mm in size. +--------------+--------++   Risk Assessment/Calculations:    CHA2DS2-VASc Score = 4   This indicates a 4.8% annual risk of stroke. The patient's score is based upon: CHF History: 0 HTN History: 1 Diabetes History: 0 Stroke History: 0 Vascular Disease History: 1 Age Score: 2 Gender Score: 0        ASSESSMENT:    No diagnosis found.    PLAN:  In order of problems listed above:  CABG/AVR:  10/04/21 [place] Biologic Bentall 25 mm Konect Resilia valve conduit with SVG to RCA. Post op TTE 11/14/21 EF 50-55% mild MR AVR normal with no AR mean gradient 6 peak 10 mmHg SBE prophylaxis    HTN  .lopressor dose decreased march 2023 12.5 mg bid improved   HLD on Lipitor  History of DVT & PE on Xarelto  OSA has inspire device    Update labs  CBC/PLT BMET for xarelto and lipid/liver on statin    F/U in a year   Medication Adjustments/Labs and Tests Ordered: Current medicines are reviewed at length with the patient today.  Concerns regarding medicines are outlined above.  Medication changes, Labs and Tests ordered today are listed in the Patient Instructions below. There are no Patient Instructions on file for this visit.    Signed, Jenkins Rouge, MD  01/25/2022 2:32 PM    [facility] [place], [place], [place]  [address] Phone: [phone]; Fax: [phone]

## 2022-01-24 ENCOUNTER — Other Ambulatory Visit: Payer: Self-pay | Admitting: Orthopedic Surgery

## 2022-01-24 DIAGNOSIS — M25562 Pain in left knee: Secondary | ICD-10-CM

## 2022-01-25 ENCOUNTER — Encounter: Payer: Self-pay | Admitting: Cardiovascular Disease

## 2022-01-25 ENCOUNTER — Ambulatory Visit (INDEPENDENT_AMBULATORY_CARE_PROVIDER_SITE_OTHER): Payer: Medicare Other | Admitting: Cardiovascular Disease

## 2022-01-25 VITALS — BP 112/62 | HR 80 | Ht 68.0 in | Wt 202.0 lb

## 2022-01-25 DIAGNOSIS — I25810 Atherosclerosis of coronary artery bypass graft(s) without angina pectoris: Secondary | ICD-10-CM

## 2022-01-25 DIAGNOSIS — I351 Nonrheumatic aortic (valve) insufficiency: Secondary | ICD-10-CM

## 2022-01-25 DIAGNOSIS — I10 Essential (primary) hypertension: Secondary | ICD-10-CM | POA: Diagnosis not present

## 2022-01-25 DIAGNOSIS — E785 Hyperlipidemia, unspecified: Secondary | ICD-10-CM | POA: Diagnosis not present

## 2022-01-25 DIAGNOSIS — I2510 Atherosclerotic heart disease of native coronary artery without angina pectoris: Secondary | ICD-10-CM

## 2022-01-25 NOTE — Patient Instructions (Signed)
Medication Instructions:  Your physician recommends that you continue on your current medications as directed. Please refer to the Current Medication list given to you today.  *If you need a refill on your cardiac medications before your next appointment, please call your pharmacy*  Lab Work: Your physician recommends that you return for lab work this week or next- CBC, CMET, Lipid Panel  If you have labs (blood work) drawn today and your tests are completely normal, you will receive your results only by: MyChart Message (if you have MyChart) OR A paper copy in the mail If you have any lab test that is abnormal or we need to change your treatment, we will call you to review the results.  Testing/Procedures: None ordered today.   Follow-Up: At [facility], you and your health needs are our priority.  As part of our continuing mission to provide you with exceptional heart care, we have created designated Provider Care Teams.  These Care Teams include your primary Cardiologist (physician) and Advanced Practice Providers (APPs -  Physician Assistants and Nurse Practitioners) who all work together to provide you with the care you need, when you need it.  We recommend signing up for the patient portal called "MyChart".  Sign up information is provided on this After Visit Summary.  MyChart is used to connect with patients for Virtual Visits (Telemedicine).  Patients are able to view lab/test results, encounter notes, upcoming appointments, etc.  Non-urgent messages can be sent to your provider as well.   To learn more about what you can do with MyChart, go to [email or web address].    Your next appointment:   1 year(s)  The format for your next appointment:   In Person  Provider:   Jenkins Rouge, MD {    Important Information About Sugar

## 2022-01-26 ENCOUNTER — Ambulatory Visit
Admission: RE | Admit: 2022-01-26 | Discharge: 2022-01-26 | Disposition: A | Discharge status: Home / Self Care | Payer: Medicare Other | Source: Ambulatory Visit | Attending: Orthopedic Surgery | Admitting: Orthopedic Surgery

## 2022-01-26 DIAGNOSIS — M25562 Pain in left knee: Secondary | ICD-10-CM

## 2022-01-26 MED ORDER — IOPAMIDOL (ISOVUE-M 200) INJECTION 41%
40.0000 mL | Freq: Once | INTRAMUSCULAR | Status: AC
  Administered 2022-01-26: 40 mL via INTRA_ARTICULAR

## 2022-01-30 ENCOUNTER — Encounter: Payer: Self-pay | Admitting: Cardiovascular Disease

## 2022-01-30 MED ORDER — AMOXICILLIN 500 MG PO CAPS: ORAL_CAPSULE | ORAL | 6 refills | Status: DC

## 2022-02-08 ENCOUNTER — Other Ambulatory Visit: Payer: Self-pay | Admitting: Cardiovascular Disease

## 2022-02-09 ENCOUNTER — Telehealth: Payer: Self-pay

## 2022-02-09 DIAGNOSIS — E785 Hyperlipidemia, unspecified: Secondary | ICD-10-CM

## 2022-02-09 LAB — COMPREHENSIVE METABOLIC PANEL
ALT: 10 IU/L (ref 0–44)
AST: 18 IU/L (ref 0–40)
Albumin/Globulin Ratio: 2 (ref 1.2–2.2)
Albumin: 4.5 g/dL (ref 3.7–4.7)
Alkaline Phosphatase: 32 IU/L — ABNORMAL LOW (ref 44–121)
BUN/Creatinine Ratio: 13 (ref 10–24)
BUN: 15 mg/dL (ref 8–27)
Bilirubin Total: 0.3 mg/dL (ref 0.0–1.2)
CO2: 24 mmol/L (ref 20–29)
Calcium: 9.9 mg/dL (ref 8.6–10.2)
Chloride: 101 mmol/L (ref 96–106)
Creatinine, Ser: 1.18 mg/dL (ref 0.76–1.27)
Globulin, Total: 2.3 g/dL (ref 1.5–4.5)
Glucose: 83 mg/dL (ref 70–99)
Potassium: 4.6 mmol/L (ref 3.5–5.2)
Sodium: 142 mmol/L (ref 134–144)
Total Protein: 6.8 g/dL (ref 6.0–8.5)
eGFR: 64 mL/min/{1.73_m2} (ref >59)

## 2022-02-09 LAB — LIPID PANEL
Chol/HDL Ratio: 3.5 ratio (ref 0.0–5.0)
Cholesterol, Total: 190 mg/dL (ref 100–199)
HDL: 55 mg/dL (ref >39)
LDL Chol Calc (NIH): 92 mg/dL (ref 0–99)
Triglycerides: 261 mg/dL — ABNORMAL HIGH (ref 0–149)
VLDL Cholesterol Cal: 43 mg/dL — ABNORMAL HIGH (ref 5–40)

## 2022-02-09 LAB — CBC WITH DIFFERENTIAL/PLATELET
Basophils Absolute: 0.1 10*3/uL (ref 0.0–0.2)
Basos: 1 %
EOS (ABSOLUTE): 0.2 10*3/uL (ref 0.0–0.4)
Eos: 4 %
Hematocrit: 40 % (ref 37.5–51.0)
Hemoglobin: 12.9 g/dL — ABNORMAL LOW (ref 13.0–17.7)
Immature Grans (Abs): 0 10*3/uL (ref 0.0–0.1)
Immature Granulocytes: 0 %
Lymphocytes Absolute: 1.5 10*3/uL (ref 0.7–3.1)
Lymphs: 29 %
MCH: 26.5 pg — ABNORMAL LOW (ref 26.6–33.0)
MCHC: 32.3 g/dL (ref 31.5–35.7)
MCV: 82 fL (ref 79–97)
Monocytes Absolute: 0.5 10*3/uL (ref 0.1–0.9)
Monocytes: 9 %
Neutrophils Absolute: 3 10*3/uL (ref 1.4–7.0)
Neutrophils: 57 %
Platelets: 233 10*3/uL (ref 150–450)
RBC: 4.87 x10E6/uL (ref 4.14–5.80)
RDW: 13.8 % (ref 11.6–15.4)
WBC: 5.3 10*3/uL (ref 3.4–10.8)

## 2022-02-09 MED ORDER — ATORVASTATIN CALCIUM 40 MG PO TABS: 40.0000 mg | ORAL_TABLET | Freq: Every day | ORAL | 3 refills | Status: DC

## 2022-02-09 NOTE — Telephone Encounter (Signed)
-----   Message from Josue Hector, MD sent at 02/09/2022  8:13 AM EDT ----- LDL > 70 increase lipitor to 40 mg and repeat labs in 3 months

## 2022-02-09 NOTE — Telephone Encounter (Signed)
Placed order for increased dose of Lipitor, Lipid and Liver.

## 2022-03-09 ENCOUNTER — Other Ambulatory Visit: Payer: Self-pay | Admitting: Orthopedic Surgery

## 2022-03-09 ENCOUNTER — Encounter: Payer: Self-pay | Admitting: Cardiovascular Disease

## 2022-03-09 DIAGNOSIS — M5416 Radiculopathy, lumbar region: Secondary | ICD-10-CM

## 2022-03-09 NOTE — Telephone Encounter (Signed)
Per [name], I doubt his dizziness is from his heart Can get 14 day monitor and order MRI head to r/o posterior circulation , cerebellar infarct. Called patient's wife back, encouraged her to continue patient's medications as prescribed. Will order monitor for patient. Patient cannot have MRI due to sleep implant device. Will see if [name] wants CT instead.

## 2022-03-09 NOTE — Telephone Encounter (Signed)
Called patient's wife back. Patient continues to have dizziness and feels like he is going to fall. She stated this has been going on for weeks. On 12/29/21 [name] stopped amiodarone and started patient on metoprolol 12.5 mg BID due to dizziness and fatigue. Patient's BP have been low and HR in 50's. Patient's wife stated patient's BP was high once when at his PCP's due to neck pain, BP 159/101. Patient is not able to sleep at night and takes Ativan at night that does not help. Patient also taking second dose of metoprolol at night. Encouraged patient's wife that she could have patient skip his ativan and metoprolol tonight and see if his symptoms continue. Informed her that patient might benefit having a cane or something to help him when changing positions, from sitting to standing. Informed her that a message has been sent to [name] for advisement.

## 2022-03-12 ENCOUNTER — Encounter: Payer: Self-pay | Admitting: Cardiovascular Disease

## 2022-03-12 DIAGNOSIS — R42 Dizziness and giddiness: Secondary | ICD-10-CM

## 2022-03-12 DIAGNOSIS — I10 Essential (primary) hypertension: Secondary | ICD-10-CM

## 2022-03-13 ENCOUNTER — Encounter: Payer: Self-pay | Admitting: Cardiovascular Disease

## 2022-03-13 ENCOUNTER — Ambulatory Visit (INDEPENDENT_AMBULATORY_CARE_PROVIDER_SITE_OTHER): Payer: Medicare Other

## 2022-03-13 DIAGNOSIS — I10 Essential (primary) hypertension: Secondary | ICD-10-CM

## 2022-03-13 DIAGNOSIS — R42 Dizziness and giddiness: Secondary | ICD-10-CM

## 2022-03-13 NOTE — Telephone Encounter (Signed)
  Per [name], I doubt his dizziness is from his heart Can get 14 day monitor and order MRI head to r/o posterior circulation , cerebellar infarct      Called patient's wife back. She stated there was no change in patient having dizziness with holding his ativan. She stated that patient's left leg is cramping, feeling cold, and severe cramping during the night. Informed patient's wife again about Dr. Kyla Balzarine advisement. Patient is unable to get MRI due to sleep implant device. Will see if [name] wants to get CT instead of MRI. Patient also has a Garment/textile technologist. Encouraged her to call Neurology about patient's symptoms as well. Order has been placed for monitor.

## 2022-03-13 NOTE — Telephone Encounter (Signed)
Called patient's wife back about Dr. Kyla Balzarine advisement. Patient's wife verbalized understanding.  Per [name], Up to neurologist if he wants head CT not as useful as MRI  His circulation was normal on pre cabg dopplers and his leg cramps are not from that

## 2022-03-13 NOTE — Progress Notes (Unsigned)
Enrolled patient for a 14 day Zio XT  monitor to be mailed to patients home  °

## 2022-03-13 NOTE — Telephone Encounter (Signed)
See Mychart message from 03/12/22.

## 2022-03-14 ENCOUNTER — Ambulatory Visit
Admission: RE | Admit: 2022-03-14 | Discharge: 2022-03-14 | Disposition: A | Discharge status: Home / Self Care | Payer: Medicare Other | Source: Ambulatory Visit | Attending: Orthopedic Surgery | Admitting: Orthopedic Surgery

## 2022-03-14 DIAGNOSIS — M5416 Radiculopathy, lumbar region: Secondary | ICD-10-CM

## 2022-03-14 MED ORDER — IOPAMIDOL (ISOVUE-M 200) INJECTION 41%
18.0000 mL | Freq: Once | INTRAMUSCULAR | Status: AC
  Administered 2022-03-14: 18 mL via INTRATHECAL

## 2022-03-14 MED ORDER — MEPERIDINE HCL 50 MG/ML IJ SOLN: 50.0000 mg | Freq: Once | INTRAMUSCULAR | Status: DC | PRN

## 2022-03-14 MED ORDER — ONDANSETRON HCL 4 MG/2ML IJ SOLN: 4.0000 mg | Freq: Once | INTRAMUSCULAR | Status: DC | PRN

## 2022-03-14 MED ORDER — DIAZEPAM 5 MG PO TABS: 5.0000 mg | ORAL_TABLET | Freq: Once | ORAL | Status: DC

## 2022-03-14 NOTE — Discharge Instructions (Signed)

## 2022-03-18 DIAGNOSIS — I10 Essential (primary) hypertension: Secondary | ICD-10-CM

## 2022-03-18 DIAGNOSIS — R42 Dizziness and giddiness: Secondary | ICD-10-CM | POA: Diagnosis not present

## 2022-03-28 ENCOUNTER — Ambulatory Visit (INDEPENDENT_AMBULATORY_CARE_PROVIDER_SITE_OTHER): Payer: Medicare Other | Admitting: Neurology

## 2022-03-28 ENCOUNTER — Encounter: Payer: Self-pay | Admitting: Neurology

## 2022-03-28 VITALS — BP 145/82 | HR 59 | Ht 68.0 in | Wt 202.0 lb

## 2022-03-28 DIAGNOSIS — Z954 Presence of other heart-valve replacement: Secondary | ICD-10-CM

## 2022-03-28 DIAGNOSIS — R42 Dizziness and giddiness: Secondary | ICD-10-CM

## 2022-03-28 DIAGNOSIS — Z952 Presence of prosthetic heart valve: Secondary | ICD-10-CM

## 2022-03-28 DIAGNOSIS — Z9682 Presence of neurostimulator: Secondary | ICD-10-CM | POA: Diagnosis not present

## 2022-03-28 DIAGNOSIS — Z9889 Other specified postprocedural states: Secondary | ICD-10-CM

## 2022-03-28 DIAGNOSIS — H818X9 Other disorders of vestibular function, unspecified ear: Secondary | ICD-10-CM

## 2022-03-28 DIAGNOSIS — I2510 Atherosclerotic heart disease of native coronary artery without angina pectoris: Secondary | ICD-10-CM | POA: Diagnosis not present

## 2022-03-28 NOTE — Patient Instructions (Signed)
Dizziness Dizziness is a common problem. It is a feeling of unsteadiness or light-headedness. You may feel like you are about to faint. Dizziness can lead to injury if you stumble or fall. Anyone can become dizzy, but dizziness is more common in older adults. This condition can be caused by a number of things, including medicines, dehydration, or illness. Follow these instructions at home: Eating and drinking  Drink enough fluid to keep your urine pale yellow. This helps to keep you from becoming dehydrated. Try to drink more clear fluids, such as water. Do not drink alcohol. Limit your caffeine intake if told to do so by your health care provider. Check ingredients and nutrition facts to see if a food or beverage contains caffeine. Limit your salt (sodium) intake if told to do so by your health care provider. Check ingredients and nutrition facts to see if a food or beverage contains sodium. Activity  Avoid making quick movements. Rise slowly from chairs and steady yourself until you feel okay. In the morning, first sit up on the side of the bed. When you feel okay, stand slowly while you hold onto something until you know that your balance is good. If you need to stand in one place for a long time, move your legs often. Tighten and relax the muscles in your legs while you are standing. Do not drive or use machinery if you feel dizzy. Avoid bending down if you feel dizzy. Place items in your home so that they are easy for you to reach without leaning over. Lifestyle Do not use any products that contain nicotine or tobacco. These products include cigarettes, chewing tobacco, and vaping devices, such as e-cigarettes. If you need help quitting, ask your health care provider. Try to reduce your stress level by using methods such as yoga or meditation. Talk with your health care provider if you need help to manage your stress. General instructions Watch your dizziness for any changes. Take  over-the-counter and prescription medicines only as told by your health care provider. Talk with your health care provider if you think that your dizziness is caused by a medicine that you are taking. Tell a friend or a family member that you are feeling dizzy. If he or she notices any changes in your behavior, have this person call your health care provider. Keep all follow-up visits. This is important. Contact a health care provider if: Your dizziness does not go away or you have new symptoms. Your dizziness or light-headedness gets worse. You feel nauseous. You have reduced hearing. You have a fever. You have neck pain or a stiff neck. Your dizziness leads to an injury or a fall. Get help right away if: You vomit or have diarrhea and are unable to eat or drink anything. You have problems talking, walking, swallowing, or using your arms, hands, or legs. You feel generally weak. You have any bleeding. You are not thinking clearly or you have trouble forming sentences. It may take a friend or family member to notice this. You have chest pain, abdominal pain, shortness of breath, or sweating. Your vision changes or you develop a severe headache. These symptoms may represent a serious problem that is an emergency. Do not wait to see if the symptoms will go away. Get medical help right away. Call your local emergency services (911 in the [place] Do not drive yourself to the hospital. Summary Dizziness is a feeling of unsteadiness or light-headedness. This condition can be caused by a number of   things, including medicines, dehydration, or illness. Anyone can become dizzy, but dizziness is more common in older adults. Drink enough fluid to keep your urine pale yellow. Do not drink alcohol. Avoid making quick movements if you feel dizzy. Monitor your dizziness for any changes. This information is not intended to replace advice given to you by your health care provider. Make sure you discuss any  questions you have with your health care provider. Document Revised: 07/18/2020 Document Reviewed: 07/18/2020 [employer] Patient Education  2023 [employer]  

## 2022-03-28 NOTE — Progress Notes (Signed)
SLEEP MEDICINE CLINIC    Provider:  Larey Seat, MD  Primary Care Physician:  [name], MD [place] [id number]     Referring Provider: Dr. Redmond Baseman, MD ENT- [place]          Chief Complaint according to patient   Patient presents with:     New Patient (Initial Visit)     INSPIRE-      RV 03-28-2022:[name]. Tullis returns for an Dawna Part follow up,  The patient is underwent a aortic valve replacement on 04 October 2021.  He received a stentless bioprosthetic aortic valve.  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  He had been using inspire for about 2 years at that time but could not after surgery not tolerate the previous settings and did not hurt under his tongue or under his jaw he had to reduce the amplitude.  I have here an INSPIRE report over 90 days which shows that he has passed the machine every night sometimes several times and he is not using it as before for over 4 hours at night.  The average is 3 hours at night now 85% of the nights he has used the machine he did not use it on 23% of all nights.  He has a current setting of 1.8 V which is under his control to reduce the voltage.  His titration study was in November 2021 incoming amplitude is 1.8, rate is 33 Hz, start delay is 30 minutes pause time 15 minutes therapy duration is set at 9 hours which currently exceeds much of what he seems to need or tolerate.  What I would like for the patient is to actually be seen here with the inspire rep I do believe that he needs a higher voltage again but he may have to switch electrodes. The pause function is set too short.    His Epworth Sleepiness Scale was today endorsed at 7 out of 24 points, fatigue severity score was not endorsed, geriatric depression scale was endorsed at at 3 out of 15 points.  [name] saw the patient on 25 January 2022 and sent him here because he felt dizzy.  Again this patient  had just underwent CABG x1 and biologic valve conduit replacement at the postoperative atrial fibrillation spell and was treated with amiodarone.  In March she had fatigue and malaise after IV overdrawn and therefore amiodarone was just discontinued.  Lopressor decreased dizziness which had somewhat improved since then.  And his orthostatic blood pressure today was 145/82, in supine position with a heart rate of 55 seated 121/80 with a heart rate of 62 when he reported slight dizziness, standing 120/82 with a heart rate of 67 when he felt less dizzy after standing for 3 minutes he did well with 129/81 mmHg blood pressure and irregular heart rate of 61.  So I do not think that this is an orthostatic problem but he had significantly more dizziness changing from supine to seated position then from being seated to standing up.        07-27-2020: Mr. Ismar Yabut. Heard returns today on 07-27-20 for a follow-up visit after his inspire in lab titration.  Incoming setting at the time was 1.2 V.  Sleep efficiency was great at 87.1% the patient's apnea index remained at 3.5/h over the night however he responded best to a final voltage of 1.8 V.  With] [residual apneas occurred only in supine sleep. The wife reports today he still snores, and loud when in supine. Wife has supine dependent apnea, very mild. He is now approaching level 2, 1.3 Volt after today's re- adjustment. He was instructed to slowly go up to 1.8V.  So his amplitude today as he leaves here will be 1.3 V lower limit 1.2 upper limit 1.9 V he has a start delay or ramp time of 30 minutes he has a pause time of 15 minutes which she uses frequently since he still has some nocturia breaks, therapy duration is 9 hours.  He is status post COVID 19 infect in Nov 2020- fully vaccinated.     03-31-2020 activation visit for INSPIRE-   Mr. Shermaine Brigham. Francisco, born on Jul 03, 1946 presented today for inspire therapy session and activation following  implantation.  The amplitude was now set from 0 to 0.6 V, patient control will be between 0.6 and 1.6 V pulse width and microseconds state at 90 s before this visit and after the rate of stimulation and hertz remained at 33 Hz before and after pause time at 15 minutes start delay at 30 minutes.  Therapy duration was set from 8 to 9 hours nocturnal stimulation with automatic check of based on the patient's reported sleep time.  Sensing for exhalation will be 7 minutes for 2-1 and has stayed at this level inhalation from zero to +1 and has stayed at this level.  Off.  38% alternating with thirteen has stayed at this level maximum stimulation time in seconds is 4 seconds and has remained there is no inversion.  Initial activation and stimulation settings have changed for amplitude, patient control and therapy duration only tongue motion was witnessed and was reported as comfortable.  The generator serial number is air [id number].  The testing of waveforms showed excellent response.  The patient will be fully titrated to inspire during a sleep study to find the goal setting.     HISTORY OF PRESENT ILLNESS:  Adrian Franco is a 76  year old Caucasian male patient seen here  In the company of his wife. He is a Forensic psychologist. Seen upon referral on 03/28/2022 from Dr Redmond Baseman, MD .  Chief concern according to patient :   I have the pleasure of seeing Adrian Franco today, a right -handed White or Caucasian male with a possible sleep disorder.  he   has a past medical history of Aneurysm ([place]), Anxiety, Aortic aneurysm ([place]), Arthritis, Atypical chest pain, BPH (benign prostatic hypertrophy), Chronic back pain, Depression, DVT (deep venous thrombosis) ([place]), GERD (gastroesophageal reflux disease), History of kidney stones, HTN (hypertension), Hyperlipemia, Hypogonadism in male, Insomnia, Localized edema, PE (pulmonary embolism), Renal insufficiency, Restless leg syndrome, Sleep apnea, SOB (shortness of breath),  and Testicular hypofunction.. He had been infected with COVID 19 in November , and recovered after 10 days of severe illness, his sense of taste and smell have returned.    The patient had the first sleep study in the year 2005, and was diagnosed with apnea.  He has been using an auto titration CPAP machine and believes the average percentile is about 10 or 11 cm pressure.  He would like to be evaluated for alternatives to CPAP use and is interested in the inspire technology.  He presented to Dr. Hessie Knows on 7 December and is described as a 76 year old male patient with a chief complaint of CPAP use for sleep apnea for the  last 10 to 12 years he has noticed a benefit when using CPAP but he has never felt that the mask is fitting him well.  He has been on a full facemask which allows more air leakage and the machine wakes him up from air leaking and he feels at times also smothered. Tried nasal mask and pillows, was snoring loudly.  His machine makes a puffing sounds.  If he can sleep with CPAP, he feels good the next day.   He didn't bring his CPAP nor his wife's to this appointment.  PS: The arousals were noted as: 75 were spontaneous, 26 were  associated with PLMs, 119 were associated with respiratory  events.  The patient had a Periodic Limb Movement (PLM) index of 44.6 and  the PLM Arousal index was 5.8/hour.  He reported leg cramping, causing prolonged wakefulness.  Audio  and video analysis did not show any abnormal or unusual  movements, behaviors, phonations or vocalizations during his  sleep.    Nocturia times one. Snoring was noted.  EKG was in keeping with sinus rhythm, but bradycardic.   IMPRESSION:  1. Given the severe degree of Obstructive Sleep Apnea (OSA) with  an AHI of 36.7/h, the associated hypoxia ( 69 minutes of total  sleep time ) and supine exacerbation to AHI of 61/h, treatment of  sleep apnea is a must.  2. The noted severe Periodic Limb Movement Disorder  (PLMD)  corelated with the patient's history of nocturnal cramping and  restlessness.  3. Primary Snoring.  4. Poor sleep quality and efficiency.     RECOMMENDATIONS:   1. I would usually advise a full-night, attended, PAP titration  study to optimize therapy. given that he feels better when PAP is  used, yet cannot longer tolerate CPAP, a change to inspire  therapy is indicated.  2. I will let Dr Redmond Baseman know that the indication for INSPIRE  therapy is given.      03-31-2020: Activation procedue for inspire, Implantation was on 02-17-2020. Hypoglossal CN stimulator-    Review of Systems: Out of a complete 14 system review, the patient complains of only the following symptoms, and all other reviewed systems are negative.:  Fatigue, sleepiness , still some snoring,  fragmented sleep, nocturia - 2 times.  RLS:  He had DVT in both legs- veins are knobbly- support socks help- try ropinorol 4 mg at night. On neurontin, too.    RLS - the urge to move - between knee and foot bilaterally, left most severe.      How likely are you to doze in the following situations: 0 = not likely, 1 = slight chance, 2 = moderate chance, 3 = high chance   Sitting and Reading? Watching Television? Sitting inactive in a public place (theater or meeting)? As a passenger in a car for an hour without a break? Lying down in the afternoon when circumstances permit? Sitting and talking to someone? Sitting quietly after lunch without alcohol? In a car, while stopped for a few minutes in traffic?   Total = 5/ 24 points   FSS endorsed at 24/ 63 points.    Baseline PSG - 10-06-2019 IMPRESSION:  1. Given the severe degree of Obstructive Sleep Apnea (OSA) with  an AHI of 36.7/h, the associated hypoxia ( 69 minutes of total  sleep time ) and supine exacerbation to AHI of 61/h, treatment of  sleep apnea is a must.  2. The noted severe Periodic Limb Movement Disorder (PLMD)  corelated  with the patient's history  of nocturnal cramping and  restlessness.  3. Primary Snoring.  4. Poor sleep quality and efficiency.     Social History   Socioeconomic History   Marital status: Married    Spouse name: [name]   Number of children: 2   Years of education: Not on file   Highest education level: Associate degree: occupational, Hotel manager, or vocational program  Occupational History   Not on file  Tobacco Use   Smoking status: Never   Smokeless tobacco: Never  Vaping Use   Vaping Use: Never used  Substance and Sexual Activity   Alcohol use: No    Alcohol/week: 0.0 standard drinks of alcohol   Drug use: No   Sexual activity: Yes    Comment: married  Other Topics Concern   Not on file  Social History Narrative   Lives with wife   R handed   Caffeine: 2 C of coffee, diet sun drop a day   Social Determinants of Radio broadcast assistant Strain: Not on file  Food Insecurity: Not on file  Transportation Needs: Not on file  Physical Activity: Not on file  Stress: Not on file  Social Connections: Not on file    Family History  Problem Relation Age of Onset   Hyperlipidemia Mother    Hyperlipidemia Father    Heart attack Father    Healthy Son    Healthy Brother    Other Brother    Healthy Sister    Healthy Son     Past Medical History:  Diagnosis Date   Aneurysm ([place])    Anxiety    Aortic aneurysm ([place])    followed by Dr Johnsie Cancel   Arthritis    Atypical chest pain    BPH (benign prostatic hypertrophy)    Chronic back pain    Depression    DVT (deep venous thrombosis) ([facility])    GERD (gastroesophageal reflux disease)    History of kidney stones    HTN (hypertension)    Hyperlipemia    Hypogonadism in male    Insomnia    Localized edema    PE (pulmonary embolism)    Renal insufficiency    Restless leg syndrome    Sleep apnea    severe OSA-cannot tolerate CPAP   SOB (shortness of breath)    Testicular hypofunction     Past Surgical History:  Procedure Laterality Date    BENTALL PROCEDURE N/A 10/04/2021   Procedure: BENTALL PROCEDURE USING 25 MM KONNECT RESILIA  AORTIC VALVED CONDUIT;  Surgeon: Melrose Nakayama, MD;  Location: [facility];  Service: Open Heart Surgery;  Laterality: N/A;   CORONARY ARTERY BYPASS GRAFT N/A 10/04/2021   Procedure: CORONARY ARTERY BYPASS GRAFTING (CABG) TIMES ONE USING RIGHT GREATER SAPHENOUS VEIN;  Surgeon: Melrose Nakayama, MD;  Location: [facility];  Service: Open Heart Surgery;  Laterality: N/A;   DRUG INDUCED ENDOSCOPY N/A 12/09/2019   Procedure: DRUG INDUCED ENDOSCOPY;  Surgeon: Melida Quitter, MD;  Location: [facility];  Service: ENT;  Laterality: N/A;   ENDOVEIN HARVEST OF GREATER SAPHENOUS VEIN Right 10/04/2021   Procedure: ENDOVEIN HARVEST OF GREATER SAPHENOUS VEIN;  Surgeon: Melrose Nakayama, MD;  Location: [facility];  Service: Open Heart Surgery;  Laterality: Right;   IMPLANTATION OF HYPOGLOSSAL NERVE STIMULATOR Right 02/17/2020   Procedure: IMPLANTATION OF HYPOGLOSSAL NERVE STIMULATOR;  Surgeon: Melida Quitter, MD;  Location: [facility];  Service: ENT;  Laterality: Right;   INGUINAL HERNIA REPAIR  KIDNEY STONE SURGERY     stent   REVERSE SHOULDER ARTHROPLASTY Left 03/08/2021   Procedure: REVERSE SHOULDER ARTHROPLASTY;  Surgeon: Hiram Gash, MD;  Location: [facility];  Service: Orthopedics;  Laterality: Left;   RIGHT/LEFT HEART CATH AND CORONARY ANGIOGRAPHY N/A 09/01/2021   Procedure: RIGHT/LEFT HEART CATH AND CORONARY ANGIOGRAPHY;  Surgeon: Jettie Booze, MD;  Location: [facility];  Service: Cardiovascular;  Laterality: N/A;   ROTATOR CUFF REPAIR Right    TEE WITHOUT CARDIOVERSION N/A 09/01/2021   Procedure: TRANSESOPHAGEAL ECHOCARDIOGRAM (TEE);  Surgeon: Josue Hector, MD;  [facility];  Service: Cardiovascular;  Laterality: N/A;   TEE WITHOUT CARDIOVERSION N/A 10/04/2021   Procedure: TRANSESOPHAGEAL ECHOCARDIOGRAM (TEE);  Surgeon: Melrose Nakayama, MD;  Location: [facility];  Service: Open Heart  Surgery;  Laterality: N/A;     Current Outpatient Medications on File Prior to Visit  Medication Sig Dispense Refill   acetaminophen (TYLENOL) 500 MG tablet Take 1,000 mg by mouth every 8 (eight) hours as needed for moderate pain.     amoxicillin (AMOXIL) 500 MG capsule Take 4 capsules (2,000mg ) by mouth 30-60 minutes before dental exams/procedures 4 capsule 6   Ascorbic Acid (VITAMIN C) 1000 MG tablet Take 1,000 mg by mouth in the morning.     aspirin EC 81 MG EC tablet Take 1 tablet (81 mg total) by mouth daily. Swallow whole. 30 tablet 11   atorvastatin (LIPITOR) 40 MG tablet Take 1 tablet (40 mg total) by mouth daily. 90 tablet 3   Cholecalciferol (VITAMIN D) 50 MCG (2000 UT) tablet Take 2,000 Units by mouth daily.     Collagen-Vitamin C (SUPER COLLAGEN PLUS VITAMIN C PO) Take 1 tablet by mouth daily.     Cyanocobalamin (B-12) 5000 MCG CAPS Take 5,000 mcg by mouth daily.     docusate sodium (COLACE) 100 MG capsule Take 100 mg by mouth daily as needed for moderate constipation.     LORazepam (ATIVAN) 0.5 MG tablet Take 0.5 mg by mouth at bedtime.     magnesium oxide (MAG-OX) 400 MG tablet Take 400 mg by mouth daily.     metoprolol tartrate (LOPRESSOR) 25 MG tablet Take 0.5 tablets (12.5 mg total) by mouth 2 (two) times daily. 90 tablet 3   Multiple Vitamins-Minerals (ONE-A-DAY MENS 50+ ADVANTAGE) TABS Take 1 tablet by mouth daily with breakfast.     omeprazole (PRILOSEC) 40 MG capsule Take 40 mg by mouth daily.     oxymetazoline (AFRIN) 0.05 % nasal spray Place 1 spray into both nostrils at bedtime.     Polyvinyl Alcohol-Povidone (REFRESH OP) Place 1 drop into the left eye daily. Refresh eye drops     pregabalin (LYRICA) 50 MG capsule Take 50 mg by mouth 2 (two) times daily.     rivaroxaban (XARELTO) 20 MG TABS tablet Take 1 tablet (20 mg total) by mouth daily with supper. 90 tablet 3   rOPINIRole (REQUIP) 4 MG tablet Take 4 mg by mouth at bedtime.     tamsulosin (FLOMAX) 0.4 MG CAPS  capsule Take 0.4 mg by mouth 2 (two) times daily.     Testosterone Cypionate 200 MG/ML SOLN Inject 200 mg into the muscle every 14 (fourteen) days.      tiZANidine (ZANAFLEX) 2 MG tablet Take 2 mg by mouth every 6 (six) hours as needed for muscle spasms.     No current facility-administered medications on file prior to visit.    Allergies  Allergen Reactions  Sulfa Antibiotics Itching   Lotensin [Benazepril Hcl] Hives and Rash    Physical exam:  Today's Vitals   03/28/22 1101  BP: (!) 145/82  Pulse: (!) 59  Weight: 202 lb (91.6 kg)  Height: 5\' 8"  (1.727 m)   Body mass index is 30.71 kg/m.   Wt Readings from Last 3 Encounters:  03/28/22 202 lb (91.6 kg)  01/25/22 202 lb (91.6 kg)  11/07/21 186 lb (84.4 kg)     Ht Readings from Last 3 Encounters:  03/28/22 5\' 8"  (1.727 m)  01/25/22 5\' 8"  (1.727 m)  11/07/21 5\' 8"  (1.727 m)      General: The patient is awake, alert and appears not in acute distress. The patient is well groomed. Head: Normocephalic, atraumatic. Neck is supple. Mallampati 3  neck circumference:17 inches . Nasal airflow congested -   Retrognathia is seen.  Dental status:  Cardiovascular:  Regular rate and cardiac rhythm by pulse,  without distended neck veins. Respiratory: Lungs are clear to auscultation.  Skin:  Without evidence of ankle edema, or rash. Trunk: The patient's posture is erect.   Neurologic exam : The patient is awake and alert, oriented to place and time.   Memory subjective described as intact.  Attention span & concentration ability appears normal.  Speech is fluent,  without  dysarthria, dysphonia or aphasia.  Mood and affect are appropriate.   Cranial nerves: no loss of smell or taste reported - recovered by December 2020 Pupils are equal and briskly reactive to light. Funduscopic exam deferred.   Extraocular movements in vertical and horizontal planes were intact and without nystagmus. No Diplopia. Visual fields by finger  perimetry are intact. Hearing was intact to soft voice and finger rubbing.    Facial sensation intact to fine touch. Facial motor strength is symmetric and tongue and uvula move midline.  Neck ROM : rotation, tilt and flexion extension were normal for age and shoulder shrug was symmetrical.    Motor exam:  Symmetric bulk, tone and ROM.   Normal tone without cog wheeling, symmetric grip strength .   Sensory:  Fine touch, pinprick and vibration were tested  and  normal.  Proprioception tested in the upper extremities was normal. Coordination: Rapid alternating movements in the fingers/hands were of normal speed.  The Finger-to-nose maneuver was intact without evidence of ataxia, dysmetria , he has bilaterally rtrremors at rest.    Gait and station: Patient could rise unassisted from a seated position, walked without assistive device.  Stance is of normal width/ base and the patient turned with 4 steps.  Toe and heel walk were deferred.  Deep tendon reflexes: in the upper and lower extremities are symmetric and intact.  Babinski response was deferred.     ASSESSMENT:     OSA :started Inspire. Mr. Hisham Provence.Hillyard was implanted with an INSPIRE  device, serial number # [id number], on 02-17-2020, by Dr. Redmond Baseman.    The inspire device was activated on 03-31-2020, following a  baseline AHI was 36.7/h.  Now returning to sleep lab for fine  tuning adjustment of inspire sleep device. Incoming setting of  1.2V.  Baseline   He has a current setting of 1.8 V which is under his control to reduce the voltage.  His titration study was in November 2021 incoming amplitude is 1.8, rate is 33 Hz, start delay is 30 minutes pause time 15 minutes therapy duration is set at 9 hours which currently exceeds much of what he seems to need or  tolerate since cardiac surgery, bypass, aortic valve.    What I would like for the patient is to actually be seen here with the inspire rep I do believe that he needs a higher  voltage again but he may have to switch electrodes. The pause function is set too short.   Orthostatics have been normal, amiodarone is d/c. Dizziness may be related to cerebrovascular changes, related to watershed/ hypo circulation in surgery. MRI brain is possible with INSPIRE device.     Electronically signed by: Larey Seat, MD 03/28/2022 11:42 AM  [facility] Board certified by The [employer] of Sleep Medicine and Diplomate of the [employer] of Sleep Medicine. Board certified In Neurology through the [place], Fellow of the [employer] of Neurology. Medical Director of [employer].

## 2022-03-29 ENCOUNTER — Telehealth: Payer: Self-pay | Admitting: Neurology

## 2022-03-29 LAB — COMPREHENSIVE METABOLIC PANEL WITH GFR
ALT: 9 IU/L (ref 0–44)
AST: 14 IU/L (ref 0–40)
Albumin/Globulin Ratio: 1.9 (ref 1.2–2.2)
Albumin: 4.1 g/dL (ref 3.8–4.8)
Alkaline Phosphatase: 26 IU/L — ABNORMAL LOW (ref 44–121)
BUN/Creatinine Ratio: 11 (ref 10–24)
BUN: 14 mg/dL (ref 8–27)
Bilirubin Total: 0.4 mg/dL (ref 0.0–1.2)
CO2: 26 mmol/L (ref 20–29)
Calcium: 9.8 mg/dL (ref 8.6–10.2)
Chloride: 98 mmol/L (ref 96–106)
Creatinine, Ser: 1.23 mg/dL (ref 0.76–1.27)
Globulin, Total: 2.2 g/dL (ref 1.5–4.5)
Glucose: 93 mg/dL (ref 70–99)
Potassium: 4.6 mmol/L (ref 3.5–5.2)
Sodium: 138 mmol/L (ref 134–144)
Total Protein: 6.3 g/dL (ref 6.0–8.5)
eGFR: 61 mL/min/1.73

## 2022-03-29 NOTE — Telephone Encounter (Signed)
medicare NPR [employer] [facility] obtains sent to GI

## 2022-03-30 ENCOUNTER — Encounter: Payer: Self-pay | Admitting: Neurology

## 2022-03-31 ENCOUNTER — Ambulatory Visit
Admission: RE | Admit: 2022-03-31 | Discharge: 2022-03-31 | Disposition: A | Discharge status: Home / Self Care | Payer: Medicare Other | Source: Ambulatory Visit | Attending: Neurology | Admitting: Neurology

## 2022-03-31 DIAGNOSIS — H818X9 Other disorders of vestibular function, unspecified ear: Secondary | ICD-10-CM

## 2022-03-31 DIAGNOSIS — R42 Dizziness and giddiness: Secondary | ICD-10-CM | POA: Diagnosis not present

## 2022-03-31 DIAGNOSIS — Z9889 Other specified postprocedural states: Secondary | ICD-10-CM

## 2022-03-31 DIAGNOSIS — Z952 Presence of prosthetic heart valve: Secondary | ICD-10-CM

## 2022-03-31 DIAGNOSIS — Z954 Presence of other heart-valve replacement: Secondary | ICD-10-CM

## 2022-03-31 DIAGNOSIS — Z9682 Presence of neurostimulator: Secondary | ICD-10-CM

## 2022-03-31 MED ORDER — GADOBENATE DIMEGLUMINE 529 MG/ML IV SOLN
19.0000 mL | Freq: Once | INTRAVENOUS | Status: AC | PRN
  Administered 2022-03-31: 19 mL via INTRAVENOUS

## 2022-03-31 NOTE — Progress Notes (Signed)
Normal metabolic panel, kidney and liver function las are normal.

## 2022-04-02 ENCOUNTER — Ambulatory Visit (INDEPENDENT_AMBULATORY_CARE_PROVIDER_SITE_OTHER): Payer: Medicare Other | Admitting: Neurology

## 2022-04-02 ENCOUNTER — Encounter: Payer: Self-pay | Admitting: Neurology

## 2022-04-02 ENCOUNTER — Telehealth: Payer: Self-pay

## 2022-04-02 DIAGNOSIS — I2510 Atherosclerotic heart disease of native coronary artery without angina pectoris: Secondary | ICD-10-CM | POA: Diagnosis not present

## 2022-04-02 DIAGNOSIS — G4733 Obstructive sleep apnea (adult) (pediatric): Secondary | ICD-10-CM

## 2022-04-02 DIAGNOSIS — R0602 Shortness of breath: Secondary | ICD-10-CM

## 2022-04-02 DIAGNOSIS — I351 Nonrheumatic aortic (valve) insufficiency: Secondary | ICD-10-CM | POA: Diagnosis not present

## 2022-04-02 DIAGNOSIS — Z9889 Other specified postprocedural states: Secondary | ICD-10-CM

## 2022-04-02 DIAGNOSIS — Z9682 Presence of neurostimulator: Secondary | ICD-10-CM | POA: Diagnosis not present

## 2022-04-02 MED ORDER — VENLAFAXINE HCL ER 37.5 MG PO CP24: 37.5000 mg | ORAL_CAPSULE | Freq: Every day | ORAL | 5 refills | Status: DC

## 2022-04-02 NOTE — Patient Instructions (Signed)
Venlafaxine Tablets What is this medication? VENLAFAXINE (VEN la fax een) treats depression and anxiety. It increases the amount of serotonin and norepinephrine in the brain, hormones that help regulate mood. It belongs to a group of medications called SNRIs. This medicine may be used for other purposes; ask your health care provider or pharmacist if you have questions. COMMON BRAND NAME(S): Effexor What should I tell my care team before I take this medication? They need to know if you have any of these conditions: Bleeding disorders Glaucoma High blood pressure High cholesterol Kidney disease Liver disease Low levels of sodium in the blood Mania or bipolar disorder Seizures Suicidal thoughts, plans, or attempt; a previous suicide attempt by you or a family member Take medications that treat or prevent blood clots Thyroid disease An unusual or allergic reaction to venlafaxine, desvenlafaxine, other medications, foods, dyes, or preservatives Pregnant or trying to get pregnant Breast-feeding How should I use this medication? Take this medication by mouth with a glass of water. Follow the directions on the prescription label. Take it with food. Take your medication at regular intervals. Do not take your medication more often than directed. Do not stop taking this medication suddenly except upon the advice of your care team. Stopping this medication too quickly may cause serious side effects or your condition may worsen. A special MedGuide will be given to you by the pharmacist with each prescription and refill. Be sure to read this information carefully each time. Talk to your care team regarding the use of this medication in children. Special care may be needed. Overdosage: If you think you have taken too much of this medicine contact a poison control center or emergency room at once. NOTE: This medicine is only for you. Do not share this medicine with others. What if I miss a dose? If you  miss a dose, take it as soon as you can. If it is almost time for your next dose, take only that dose. Do not take double or extra doses. What may interact with this medication? Do not take this medication with any of the following: Certain medications for fungal infections like fluconazole, itraconazole, ketoconazole, posaconazole, voriconazole Cisapride Desvenlafaxine Dronedarone Duloxetine Levomilnacipran Linezolid MAOIs like Carbex, Eldepryl, Marplan, Nardil, and Parnate Methylene blue (injected into a vein) Milnacipran Pimozide Thioridazine This medication may also interact with the following: Amphetamines Aspirin and aspirin-like medications Certain medications for depression, anxiety, or psychotic disturbances Certain medications for migraine headaches like almotriptan, eletriptan, frovatriptan, naratriptan, rizatriptan, sumatriptan, zolmitriptan Certain medications for sleep Certain medications that treat or prevent blood clots like dalteparin, enoxaparin, warfarin Cimetidine Clozapine Diuretics Fentanyl Furazolidone Indinavir Isoniazid Lithium Metoprolol NSAIDS, medications for pain and inflammation, like ibuprofen or naproxen Other medications that prolong the QT interval (cause an abnormal heart rhythm) like dofetilide, ziprasidone Procarbazine Rasagiline Supplements like St. John's wort, kava kava, valerian Tramadol Tryptophan This list may not describe all possible interactions. Give your health care provider a list of all the medicines, herbs, non-prescription drugs, or dietary supplements you use. Also tell them if you smoke, drink alcohol, or use illegal drugs. Some items may interact with your medicine. What should I watch for while using this medication? Tell your care team if your symptoms do not get better or if they get worse. Visit your care team for regular checks on your progress. Because it may take several weeks to see the full effects of this  medication, it is important to continue your treatment as prescribed by your care  team. Watch for new or worsening thoughts of suicide or depression. This includes sudden changes in mood, behaviors, or thoughts. These changes can happen at any time but are more common in the beginning of treatment or after a change in dose. Call your care team right away if you experience these thoughts or worsening depression. Manic episodes may happen in patients with bipolar disorder who take this medication. Watch for changes in feelings or behaviors such as feeling anxious, nervous, agitated, panicky, irritable, hostile, aggressive, impulsive, severely restless, overly excited and hyperactive, or trouble sleeping. These changes can happen at any time but are more common in the beginning of treatment or after a change in dose. Call your care team right away if you notice any of these symptoms. This medication can cause an increase in blood pressure. Check with your care team for instructions on monitoring your blood pressure while taking this medication. You may get drowsy or dizzy. Do not drive, use machinery, or do anything that needs mental alertness until you know how this medication affects you. Do not stand or sit up quickly, especially if you are an older patient. This reduces the risk of dizzy or fainting spells. Alcohol may interfere with the effect of this medication. Avoid alcoholic drinks. Your mouth may get dry. Chewing sugarless gum, sucking hard candy and drinking plenty of water will help. Contact your care team, if the problem does not go away or is severe. What side effects may I notice from receiving this medication? Side effects that you should report to your care team as soon as possible: Allergic reactions--skin rash, itching, hives, swelling of the face, lips, tongue, or throat Bleeding--bloody or black, tar-like stools, red or dark brown urine, vomiting blood or brown material that looks like  coffee grounds, small, red or purple spots on skin, unusual bleeding or bruising Heart rhythm changes--fast or irregular heartbeat, dizziness, feeling faint or lightheaded, chest pain, trouble breathing Increase in blood pressure Loss of appetite with weight loss Low sodium level--muscle weakness, fatigue, dizziness, headache, confusion Serotonin syndrome--irritability, confusion, fast or irregular heartbeat, muscle stiffness, twitching muscles, sweating, high fever, seizures, chills, vomiting, diarrhea Sudden eye pain or change in vision such as blurry vision, seeing halos around lights, vision loss Thoughts of suicide or self-harm, worsening mood, feelings of depression Side effects that usually do not require medical attention (report to your care team if they continue or are bothersome): Anxiety, nervousness Change in sex drive or performance Dizziness Dry mouth Excessive sweating Nausea Tremors or shaking Trouble sleeping This list may not describe all possible side effects. Call your doctor for medical advice about side effects. You may report side effects to FDA at 1-800-FDA-1088. Where should I keep my medication? Keep out of the reach of children and pets. Store at a controlled temperature between 20 and 25 degrees C (68 and 77 degrees F), in a dry place. Throw away any unused medication after the expiration date. NOTE: This sheet is a summary. It may not cover all possible information. If you have questions about this medicine, talk to your doctor, pharmacist, or health care provider.  2023 [employer] (2020-08-04 00:00:00)

## 2022-04-02 NOTE — Progress Notes (Signed)
SLEEP MEDICINE CLINIC    Provider:  Larey Seat, MD  Primary Care Physician:  [name], MD [address] [place] [address]     Referring Provider: Dr. Redmond Baseman, MD ENT- [facility]  INSPIRE resetting for patient with ongoing fatigue, mild dizziness,chronic after cardiac surgery.          Chief Complaint according to patient   Patient presents with:     New Patient (Initial Visit)     INSPIRE- RV   RV 04-02-2022, Adrian Franco is in the sleep lab to be seen for his decreased tolerance of his previously well received setting on inspire. He had undergone a prolonged open heart surgery, TEE, Aortic valve CABG> as reported below- almost 5 hours of anesthesia.    He is now very sensitive to the current settings, which were tolerated before . 1.8 V was today's 80% strength of setting. We reduced this to 0.5V and will from here slowly reset. He barely felt this stimulation at 0.5 but can work up step-by-step - can increase up to 0.9V  Pause setting increased to 30 m, therapy delay 30 minutes, duration 9 hours.                     Note        Recent Patient Communication   RV 03-28-2022:Adrian Franco returns for an Dawna Part follow up,  The patient is underwent a aortic valve replacement on 04 October 2021.  He received a stentless bioprosthetic aortic valve.  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  He had been using inspire for about 2 years at that time but could not after surgery not tolerate the previous settings and did not hurt under his tongue or under his jaw he had to reduce the amplitude.  I have here an INSPIRE report over 90 days which shows that he has passed the machine every night sometimes several times and he is not using it as before for over 4 hours at night.  The average is 3 hours at night now 85% of the nights he has used the machine he did not use it on 23% of all nights.  He has a current setting of 1.8  V which is under his control to reduce the voltage.  His titration study was in November 2021 incoming amplitude is 1.8, rate is 33 Hz, start delay is 30 minutes pause time 15 minutes therapy duration is set at 9 hours which currently exceeds much of what he seems to need or tolerate.  What I would like for the patient is to actually be seen here with the inspire rep I do believe that he needs a higher voltage again but he may have to switch electrodes. The pause function is set too short.    His Epworth Sleepiness Scale was today endorsed at 7 out of 24 points, fatigue severity score was not endorsed, geriatric depression scale was endorsed at at 3 out of 15 points.  [name] saw the patient on 25 January 2022 and sent him here because he felt dizzy.  Again ,this patient had just underwent CABG x1 and biologic valve conduit replacement at the postoperative atrial fibrillation spell and was treated with amiodarone.  In March ,he had fatigue and malaise after IV overdrawn and therefore amiodarone was  discontinued.  Lopressor decreased dizziness which had somewhat improved since then.  And his orthostatic  blood pressure today was 145/82, in supine position with a heart rate of 55 seated 121/80 with a heart rate of 62 when he reported slight dizziness, standing 120/82 with a heart rate of 67 when he felt less dizzy after standing for 3 minutes he did well with 129/81 mmHg blood pressure and irregular heart rate of 61.  So I do not think that this is an orthostatic problem but he had significantly more dizziness changing from supine to seated position then from being seated to standing up.  07-27-2020: Adrian Franco returns today on 07-27-20 for a follow-up visit after his inspire in lab titration.  Incoming setting at the time was 1.2 V.  Sleep efficiency was great at 87.1% the patient's apnea index remained at 3.5/h over the night however he responded best to a final voltage of 1.8 V.  With] [residual  apneas occurred only in supine sleep. The wife reports today he still snores, and loud when in supine. Wife has supine dependent apnea, very mild. He is now approaching level 2, 1.3 Volt after today's re- adjustment. He was instructed to slowly go up to 1.8V.  So his amplitude today as he leaves here will be 1.3 V lower limit 1.2 upper limit 1.9 V he has a start delay or ramp time of 30 minutes he has a pause time of 15 minutes which she uses frequently since he still has some nocturia breaks, therapy duration is 9 hours. He is status post COVID 19 infect in Nov 2020- fully vaccinated.     03-31-2020 Activation visit for INSPIRE-   Adrian Franco, born on 12/01/1945 presented today for inspire therapy session and activation following implantation.  The amplitude was now set from 0 to 0.6 V, patient control will be between 0.6 and 1.6 V pulse width and microseconds state at 90 s before this visit and after the rate of stimulation and hertz remained at 33 Hz before and after pause time at 15 minutes start delay at 30 minutes.  Therapy duration was set from 8 to 9 hours nocturnal stimulation with automatic check of based on the patient's reported sleep time.  Sensing for exhalation will be 7 minutes for 2-1 and has stayed at this level inhalation from zero to +1 and has stayed at this level.  Off.  38% alternating with thirteen has stayed at this level maximum stimulation time in seconds is 4 seconds and has remained there is no inversion.  Initial activation and stimulation settings have changed for amplitude, patient control and therapy duration only tongue motion was witnessed and was reported as comfortable.  The generator serial number is air [id number].  The testing of waveforms showed excellent response.  The patient will be fully titrated to inspire during a sleep study to find the goal setting.    HISTORY OF PRESENT ILLNESS:  Adrian Franco is a 76  year old Caucasian male patient  seen here  In the company of his wife. He is a Nurse, learning disability. Seen upon referral from Dr Redmond Baseman, MD . Adrian Franco  has a past medical history of Aneurysm ([place]), Anxiety, Aortic aneurysm ([place]), Arthritis, Atypical chest pain, BPH (benign prostatic hypertrophy), Chronic back pain, Depression, DVT (deep venous thrombosis) ([place]), GERD (gastroesophageal reflux disease), History of kidney stones, HTN (hypertension), Hyperlipemia, Hypogonadism in male, Insomnia, Localized edema, PE (pulmonary embolism), Renal insufficiency, Restless leg syndrome, Sleep apnea, SOB (shortness of breath), and Testicular hypofunction.. He had been infected  with COVID 19 in November  and recovered after 10 days of severe illness, his sense of taste and smell have returned.    The patient had the first sleep study in the year 2005, and was diagnosed with apnea.  He has been using an auto titration CPAP machine and believes the average percentile is about 10 or 11 cm pressure.  He would like to be evaluated for alternatives to CPAP use and is interested in the inspire technology.  He presented to Dr. Hessie Knows on 7 December and is described as a 76 year old male patient with a chief complaint of CPAP use for sleep apnea for the last 10 to 12 years he has noticed a benefit when using CPAP but he has never felt that the mask is fitting him well.  He has been on a full facemask which allows more air leakage and the machine wakes him up from air leaking and he feels at times also smothered. Tried nasal mask and pillows, was snoring loudly.  His machine makes a puffing sounds.  If he can sleep with CPAP, he feels good the next day.  He didn't bring his CPAP nor his wife's to his appointment.  PSG 10-06-2019: The arousals were noted as: 75 were spontaneous, 26 were  associated with PLMs, 119 were associated with respiratory  events.  The patient had a Periodic Limb Movement (PLM) index of 44.6 and  the PLM Arousal index was  5.8/hour.  He reported leg cramping, causing prolonged wakefulness.  Audio  and video analysis did not show any abnormal or unusual  movements, behaviors, phonations or vocalizations during his  sleep.    Nocturia times one. Snoring was noted.  EKG was in keeping with sinus rhythm, but bradycardic.   IMPRESSION:  1. Given the severe degree of Obstructive Sleep Apnea (OSA) with  an AHI of 36.7/h, the associated hypoxia ( 69 minutes of total  sleep time ) and supine exacerbation to AHI of 61/h, treatment of  sleep apnea is a must.  2. 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  3. Primary Snoring.  4. Poor sleep quality and efficiency.     RECOMMENDATIONS:   1. I would usually advise a full-night, attended, PAP titration  study to optimize therapy. given that he feels better when PAP is  used, yet cannot longer tolerate CPAP, a change to inspire  therapy is indicated.  2. I will let Dr Redmond Baseman know that the indication for INSPIRE  therapy is given.      03-31-2020: Activation procedue for inspire, Implantation was on 02-17-2020. Hypoglossal CN stimulator-    Review of Systems: Out of a complete 14 system review, the patient complains of only the following symptoms, and all other reviewed systems are negative.:  Fatigue, sleepiness , still some snoring,  fragmented sleep, nocturia - 2 times.  RLS:  He had DVT in both legs- veins are knobbly- support socks help- try ropinorol 4 mg at night. On neurontin, too.    RLS - the urge to move - between knee and foot bilaterally, left most severe.      How likely are you to doze in the following situations: 0 = not likely, 1 = slight chance, 2 = moderate chance, 3 = high chance   Sitting and Reading? Watching Television? Sitting inactive in a public place (theater or meeting)? As a passenger in a car for an  hour without a break? Lying down in  the afternoon when circumstances permit? Sitting and talking to someone? Sitting quietly after lunch without alcohol? In a car, while stopped for a few minutes in traffic?   Total = 5/ 24 points   FSS endorsed at 24/ 63 points.    Social History   Socioeconomic History   Marital status: Married    Spouse name: [name]   Number of children: 2   Years of education: Not on file   Highest education level: Associate degree: occupational, Hotel manager, or vocational program  Occupational History   Not on file  Tobacco Use   Smoking status: Never   Smokeless tobacco: Never  Vaping Use   Vaping Use: Never used  Substance and Sexual Activity   Alcohol use: No    Alcohol/week: 0.0 standard drinks of alcohol   Drug use: No   Sexual activity: Yes    Comment: married  Other Topics Concern   Not on file  Social History Narrative   Lives with wife   R handed   Caffeine: 2 C of coffee, diet sun drop a day   Social Determinants of Radio broadcast assistant Strain: Not on file  Food Insecurity: Not on file  Transportation Needs: Not on file  Physical Activity: Not on file  Stress: Not on file  Social Connections: Not on file    Family History  Problem Relation Age of Onset   Hyperlipidemia Mother    Hyperlipidemia Father    Heart attack Father    Healthy Son    Healthy Brother    Other Brother    Healthy Sister    Healthy Son     Past Medical History:  Diagnosis Date   Aneurysm ([place])    Anxiety    Aortic aneurysm ([place])    followed by Dr Johnsie Cancel   Arthritis    Atypical chest pain    BPH (benign prostatic hypertrophy)    Chronic back pain    Depression    DVT (deep venous thrombosis) ([facility])    GERD (gastroesophageal reflux disease)    History of kidney stones    HTN (hypertension)    Hyperlipemia    Hypogonadism in male    Insomnia    Localized edema    PE (pulmonary embolism)    Renal insufficiency    Restless leg syndrome    Sleep apnea    severe OSA-cannot  tolerate CPAP   SOB (shortness of breath)    Testicular hypofunction     Past Surgical History:  Procedure Laterality Date   BENTALL PROCEDURE N/A 10/04/2021   Procedure: BENTALL PROCEDURE USING 25 MM KONNECT RESILIA  AORTIC VALVED CONDUIT;  Surgeon: Melrose Nakayama, MD;  Location: [facility];  Service: Open Heart Surgery;  Laterality: N/A;   CORONARY ARTERY BYPASS GRAFT N/A 10/04/2021   Procedure: CORONARY ARTERY BYPASS GRAFTING (CABG) TIMES ONE USING RIGHT GREATER SAPHENOUS VEIN;  Surgeon: Melrose Nakayama, MD;  Location: [facility];  Service: Open Heart Surgery;  Laterality: N/A;   DRUG INDUCED ENDOSCOPY N/A 12/09/2019   Procedure: DRUG INDUCED ENDOSCOPY;  Surgeon: Melida Quitter, MD;  Location: [facility];  Service: ENT;  Laterality: N/A;   ENDOVEIN HARVEST OF GREATER SAPHENOUS VEIN Right 10/04/2021   Procedure: ENDOVEIN HARVEST OF GREATER SAPHENOUS VEIN;  Surgeon: Melrose Nakayama, MD;  Location: [facility];  Service: Open Heart Surgery;  Laterality: Right;   IMPLANTATION OF HYPOGLOSSAL NERVE STIMULATOR Right 02/17/2020  Procedure: IMPLANTATION OF HYPOGLOSSAL NERVE STIMULATOR;  Surgeon: Melida Quitter, MD;  Location: [facility];  Service: ENT;  Laterality: Right;   INGUINAL HERNIA REPAIR     KIDNEY STONE SURGERY     stent   REVERSE SHOULDER ARTHROPLASTY Left 03/08/2021   Procedure: REVERSE SHOULDER ARTHROPLASTY;  Surgeon: Hiram Gash, MD;  Location: [facility];  Service: Orthopedics;  Laterality: Left;   RIGHT/LEFT HEART CATH AND CORONARY ANGIOGRAPHY N/A 09/01/2021   Procedure: RIGHT/LEFT HEART CATH AND CORONARY ANGIOGRAPHY;  Surgeon: Jettie Booze, MD;  Location: [facility];  Service: Cardiovascular;  Laterality: N/A;   ROTATOR CUFF REPAIR Right    TEE WITHOUT CARDIOVERSION N/A 09/01/2021   Procedure: TRANSESOPHAGEAL ECHOCARDIOGRAM (TEE);  Surgeon: Josue Hector, MD;  [facility];  Service: Cardiovascular;  Laterality: N/A;   TEE WITHOUT CARDIOVERSION N/A  10/04/2021   Procedure: TRANSESOPHAGEAL ECHOCARDIOGRAM (TEE);  Surgeon: Melrose Nakayama, MD;  Location: [facility];  Service: Open Heart Surgery;  Laterality: N/A;     Current Outpatient Medications on File Prior to Visit  Medication Sig Dispense Refill   acetaminophen (TYLENOL) 500 MG tablet Take 1,000 mg by mouth every 8 (eight) hours as needed for moderate pain.     amoxicillin (AMOXIL) 500 MG capsule Take 4 capsules (2,000mg ) by mouth 30-60 minutes before dental exams/procedures 4 capsule 6   Ascorbic Acid (VITAMIN C) 1000 MG tablet Take 1,000 mg by mouth in the morning.     aspirin EC 81 MG EC tablet Take 1 tablet (81 mg total) by mouth daily. Swallow whole. 30 tablet 11   atorvastatin (LIPITOR) 40 MG tablet Take 1 tablet (40 mg total) by mouth daily. 90 tablet 3   Cholecalciferol (VITAMIN D) 50 MCG (2000 UT) tablet Take 2,000 Units by mouth daily.     Collagen-Vitamin C (SUPER COLLAGEN PLUS VITAMIN C PO) Take 1 tablet by mouth daily.     Cyanocobalamin (B-12) 5000 MCG CAPS Take 5,000 mcg by mouth daily.     docusate sodium (COLACE) 100 MG capsule Take 100 mg by mouth daily as needed for moderate constipation.     LORazepam (ATIVAN) 0.5 MG tablet Take 0.5 mg by mouth at bedtime.     magnesium oxide (MAG-OX) 400 MG tablet Take 400 mg by mouth daily.     metoprolol tartrate (LOPRESSOR) 25 MG tablet Take 0.5 tablets (12.5 mg total) by mouth 2 (two) times daily. 90 tablet 3   Multiple Vitamins-Minerals (ONE-A-DAY MENS 50+ ADVANTAGE) TABS Take 1 tablet by mouth daily with breakfast.     omeprazole (PRILOSEC) 40 MG capsule Take 40 mg by mouth daily.     oxymetazoline (AFRIN) 0.05 % nasal spray Place 1 spray into both nostrils at bedtime.     Polyvinyl Alcohol-Povidone (REFRESH OP) Place 1 drop into the left eye daily. Refresh eye drops     pregabalin (LYRICA) 50 MG capsule Take 50 mg by mouth at bedtime.     rivaroxaban (XARELTO) 20 MG TABS tablet Take 1 tablet (20 mg total) by mouth daily with  supper. 90 tablet 3   rOPINIRole (REQUIP) 4 MG tablet Take 4 mg by mouth at bedtime.     tamsulosin (FLOMAX) 0.4 MG CAPS capsule Take 0.4 mg by mouth 2 (two) times daily.     Testosterone Cypionate 200 MG/ML SOLN Inject 200 mg into the muscle every 14 (fourteen) days.      tiZANidine (ZANAFLEX) 2 MG tablet Take 2 mg by mouth every 6 (six)  hours as needed for muscle spasms.     No current facility-administered medications on file prior to visit.    Allergies  Allergen Reactions   Sulfa Antibiotics Itching   Lotensin [Benazepril Hcl] Hives and Rash    Physical exam:  There were no vitals filed for this visit.  There is no height or weight on file to calculate BMI.   Wt Readings from Last 3 Encounters:  03/28/22 202 lb (91.6 kg)  01/25/22 202 lb (91.6 kg)  11/07/21 186 lb (84.4 kg)     Ht Readings from Last 3 Encounters:  03/28/22 5\' 8"  (1.727 m)  01/25/22 5\' 8"  (1.727 m)  11/07/21 5\' 8"  (1.727 m)      General: The patient is awake, alert and appears not in acute distress. The patient is well groomed. Head: Normocephalic, atraumatic. Neck is supple. Mallampati 3  neck circumference:17 inches . Nasal airflow congested -   Retrognathia is seen.  Dental status:  Cardiovascular:  Regular rate and cardiac rhythm by pulse,  without distended neck veins. Respiratory: Lungs are clear to auscultation.  Skin:  Without evidence of ankle edema, or rash. Trunk: The patient's posture is erect.   Neurologic exam : The patient is awake and alert, oriented to place and time.   Memory subjective described as intact.  Attention span & concentration ability appears normal.  Speech is fluent,  without  dysarthria, dysphonia or aphasia.  Mood and affect are appropriate.   Cranial nerves: no loss of smell or taste reported - recovered by December 2020 Pupils are equal and briskly reactive to light. Funduscopic exam deferred.   Extraocular movements in vertical and horizontal planes were  intact and without nystagmus. No Diplopia.  Facial sensation intact to fine touch. Facial motor strength is symmetric and tongue and uvula move midline.  Neck ROM : rotation, tilt and flexion extension were normal for age and shoulder shrug was symmetrical.    Motor exam:  Symmetric bulk, tone and ROM.   Normal tone without cog wheeling, symmetric grip strength .   Sensory:  normal.  Proprioception tested in the upper extremities was normal. Coordination:  The Finger-to-nose maneuver was intact without evidence of ataxia, dysmetria , he has bilaterally hand tremors at rest and with intent.  Gait and station: Patient could rise unassisted from a seated position, walked without assistive device.  Stance is of normal width/ base and the patient turned with 4 steps.  Toe and heel walk were deferred.  Deep tendon reflexes: in the upper and lower extremities are symmetric and intact.  Babinski response was deferred.     ASSESSMENT:     OSA patient , intolerant of CPAP, :started [facility] stimulator. Adrian Franco was implanted with an INSPIRE  device, serial number # [id number], on 02-17-2020, by Dr. Redmond Baseman.    The inspire device was activated on 03-31-2020, following a  baseline AHI was 36.7/h.  Now returning to sleep lab for fine  tuning , reduced the Voltage output to 0.5 V.   Dizziness may be related to cerebrovascular changes, related to watershed/ hypo circulation in surgery. MRI brain was not indicative of post OP watershed or malperfusion. He is still insecure , fears falling. No vertigo, just chronic subjective dizziness, which feels as if being on a rocking boat.  I will start Effexor.      Electronically signed by: Larey Seat, MD 04/02/2022 1:55 PM  [facility] Board certified by The [employer] of  Sleep Medicine and Diplomate of the [place] [employer] of Sleep Medicine. Board certified In Neurology through the [place], Fellow of the  [employer] of Neurology. Medical Director of [employer] [facility].

## 2022-04-03 ENCOUNTER — Other Ambulatory Visit: Payer: 59

## 2022-04-17 ENCOUNTER — Other Ambulatory Visit: Payer: Self-pay

## 2022-04-17 MED ORDER — VENLAFAXINE HCL ER 37.5 MG PO CP24: 37.5000 mg | ORAL_CAPSULE | Freq: Every day | ORAL | 1 refills | Status: DC

## 2022-05-24 ENCOUNTER — Telehealth: Payer: Self-pay | Admitting: Neurology

## 2022-05-24 NOTE — Telephone Encounter (Signed)
HST- Medicare/cigna supp no auth req.  Patient is scheduled at [facility] for 06/26/22 at 1 pm.  Mailed packet to the patient.

## 2022-05-31 ENCOUNTER — Telehealth: Payer: Self-pay

## 2022-05-31 NOTE — Telephone Encounter (Signed)
The patient has been notified of the result and verbalized understanding.  All questions (if any) were answered. Michaelyn Barter, RN 05/31/2022 1:45 PM   Will update medication list.

## 2022-05-31 NOTE — Telephone Encounter (Signed)
-----   Message from Josue Hector, MD sent at 04/09/2022  3:38 PM EDT ----- Has some early am bradycardia and wenkebach can d/c lopressor

## 2022-06-01 ENCOUNTER — Telehealth: Payer: Self-pay | Admitting: Cardiovascular Disease

## 2022-06-01 MED ORDER — AMLODIPINE BESYLATE 5 MG PO TABS: 5.0000 mg | ORAL_TABLET | Freq: Every day | ORAL | 11 refills | Status: DC

## 2022-06-01 NOTE — Telephone Encounter (Signed)
Patient's wife is wanting to know what should patient take for BP since he was taken off metoprolol for Bradicardia and Wenkebach after monitor results. Patient was on Metoprolol 12.5 mg BID. Will forward to [name] for advisement.

## 2022-06-01 NOTE — Addendum Note (Signed)
Addended by: Aris Georgia, Ardon Franklin L on: 06/01/2022 03:54 PM   Modules accepted: Orders

## 2022-06-01 NOTE — Telephone Encounter (Signed)
Left detailed message. Per [name] patient can start amlodipine 5 mg by mouth daily if his BP is high, he is allergic to ACE. Will go ahead and send medication in to patient's pharmacy since it is Friday. Sent to local pharmacy and only 30 day supply incase patient is unable to tolerate.

## 2022-06-01 NOTE — Telephone Encounter (Signed)
Left message for patient to call back  

## 2022-06-01 NOTE — Telephone Encounter (Signed)
Pt c/o medication issue:  1. Name of Medication: Metoprolol  2. How are you currently taking this medication (dosage and times per day)? NA  3. Are you having a reaction (difficulty breathing--STAT)? No  4. What is your medication issue? Wife states that pt was advised to stop medication. She would like a callback regarding this. Please advise

## 2022-06-26 ENCOUNTER — Ambulatory Visit (INDEPENDENT_AMBULATORY_CARE_PROVIDER_SITE_OTHER): Payer: Medicare Other | Admitting: Neurology

## 2022-06-26 DIAGNOSIS — G4733 Obstructive sleep apnea (adult) (pediatric): Secondary | ICD-10-CM | POA: Diagnosis not present

## 2022-06-26 DIAGNOSIS — Z9682 Presence of neurostimulator: Secondary | ICD-10-CM

## 2022-06-26 DIAGNOSIS — Z9889 Other specified postprocedural states: Secondary | ICD-10-CM

## 2022-06-26 DIAGNOSIS — R0602 Shortness of breath: Secondary | ICD-10-CM

## 2022-06-26 DIAGNOSIS — I351 Nonrheumatic aortic (valve) insufficiency: Secondary | ICD-10-CM

## 2022-06-28 NOTE — Progress Notes (Signed)
           [facility] TEST REPORT ( by Watch PAT)  INSPIRE patient  STUDY DATE:  11.2.2023   ORDERING CLINICIAN: Asencion Partridge Dohmeier  REFERRING CLINICIAN: Dr Redmond Baseman , ENT    CLINICAL INFORMATION/HISTORY: Mr Bedwell had undergone a prolonged cardiac surgery , TEE, aortic aneurysm [facility] surgery, CABG. He could since no longer tolerate Inspire device . Had been  on 1.8 V and this was reduced to 0.5 V  for a slow re-titration at home, followed by HST.      Epworth sleepiness score: 5 /24. FSS at 24/ 63     Neck Circumference: 17"   FINDINGS:   Sleep Summary:   Total Recording Time (hours, min):    8 h and 12 m    Total Sleep Time (hours, min): 6 h and 52 m                Percent REM (%):                                        Respiratory Indices:   Calculated pAHI (per hour):   41.4/h                          REM pAHI:   40.8/h                                              NREM pAHI: 41.5/h                              Positional  AHI:   no data , neither snoring data.                                               Oxygen Saturation Statistics:   O2 Saturation Range (%):    77 through 99 %                                   O2 Saturation (minutes) <89%:  2.2 m         Pulse Rate Statistics:   Pulse Mean (bpm):     60 bpm            Pulse Range:    between 40 and 108 bpm.              IMPRESSION:  This HST confirms the presence of severe sleep apnea, OSA, while the patient is presumably using an INSPIRE device-  poor apnea control .    RECOMMENDATION: In lab re-titration of the Inspire device is needed.     INTERPRETING PHYSICIAN:   Larey Seat, MD   Medical Director of [facility].

## 2022-07-01 NOTE — Progress Notes (Signed)
IMPRESSION:  This HST confirms the presence of severe sleep apnea, OSA, while the patient is presumably using his INSPIRE device ( 0.9 v) - this speaks for poor apnea control.   RECOMMENDATION: In lab re-titration of the Inspire device is needed. - please confirm time of study with Barrie Lyme.

## 2022-07-01 NOTE — Procedures (Signed)
       [facility] TEST REPORT ( by Watch PAT)  INSPIRE patient  STUDY DATE:  11.2.2023   ORDERING CLINICIAN: Asencion Partridge Delicia Berens  REFERRING CLINICIAN: Dr Redmond Baseman , ENT    CLINICAL INFORMATION/HISTORY: Mr Younge had undergone a prolonged cardiac surgery , TEE, aortic aneurysm Alden Server surgery, CABG. He could since no longer tolerate Inspire device . Had been  on 1.8 V and this was reduced to 0.5 V  for a slow re-titration at home, followed by HST.      Epworth sleepiness score: 5 /24. FSS at 24/ 63     Neck Circumference: 17"   FINDINGS:   Sleep Summary:   Total Recording Time (hours, min):    8 h and 12 m    Total Sleep Time (hours, min): 6 h and 52 m                Percent REM (%):                                        Respiratory Indices:   Calculated pAHI (per hour):   41.4/h                          REM pAHI:   40.8/h                                              NREM pAHI: 41.5/h                              Positional  AHI:   no data , neither snoring data.                                               Oxygen Saturation Statistics:   O2 Saturation Range (%):    77 through 99 %                                   O2 Saturation (minutes) <89%:  2.2 m         Pulse Rate Statistics:   Pulse Mean (bpm):     60 bpm            Pulse Range:    between 40 and 108 bpm.              IMPRESSION:  This HST confirms the presence of severe sleep apnea, OSA, while the patient is presumably using an INSPIRE device-  poor apnea control .    RECOMMENDATION: In lab re-titration of the Inspire device is needed.     INTERPRETING PHYSICIAN:   Larey Seat, MD   Medical Director of [facility].

## 2022-07-01 NOTE — Addendum Note (Signed)
Addended by: Larey Seat on: 07/01/2022 09:09 AM   Modules accepted: Orders

## 2022-07-02 ENCOUNTER — Encounter: Payer: Self-pay | Admitting: Neurology

## 2022-09-08 ENCOUNTER — Other Ambulatory Visit: Payer: Self-pay | Admitting: Neurology

## 2022-10-12 ENCOUNTER — Other Ambulatory Visit: Payer: Self-pay | Admitting: *Deleted

## 2022-10-12 DIAGNOSIS — I872 Venous insufficiency (chronic) (peripheral): Secondary | ICD-10-CM

## 2022-10-31 ENCOUNTER — Ambulatory Visit (INDEPENDENT_AMBULATORY_CARE_PROVIDER_SITE_OTHER): Payer: Medicare Other | Admitting: Physician Assistant

## 2022-10-31 ENCOUNTER — Ambulatory Visit (HOSPITAL_COMMUNITY)
Admission: RE | Admit: 2022-10-31 | Discharge: 2022-10-31 | Disposition: A | Discharge status: Home / Self Care | Payer: Medicare Other | Source: Ambulatory Visit | Attending: Vascular Surgery | Admitting: Vascular Surgery

## 2022-10-31 VITALS — BP 137/85 | HR 96 | Temp 98.1°F | Resp 20 | Ht 68.0 in | Wt 205.4 lb

## 2022-10-31 DIAGNOSIS — I872 Venous insufficiency (chronic) (peripheral): Secondary | ICD-10-CM

## 2022-10-31 NOTE — Progress Notes (Unsigned)
[facility] [place]   Reason for referral:   History of Present Illness  Adrian Franco is a 77 y.o. male who presents with chief complaint: swollen leg.  Patient notes, onset of swelling *** months ago, associated with ***.  The patient has had *** history of DVT, *** history of varicose vein, *** history of venous stasis ulcers, *** history of  Lymphedema and *** history of skin changes in lower legs.  There is *** family history of venous disorders.  The patient has *** used compression stockings in the past.  Past Medical History:  Diagnosis Date   Aneurysm ([place])    Anxiety    Aortic aneurysm (HCC)    followed by Dr Johnsie Cancel   Arthritis    Atypical chest pain    BPH (benign prostatic hypertrophy)    Chronic back pain    Depression    DVT (deep venous thrombosis) (HCC)    GERD (gastroesophageal reflux disease)    History of kidney stones    HTN (hypertension)    Hyperlipemia    Hypogonadism in male    Insomnia    Localized edema    PE (pulmonary embolism)    Renal insufficiency    Restless leg syndrome    Sleep apnea    severe OSA-cannot tolerate CPAP   SOB (shortness of breath)    Testicular hypofunction     Past Surgical History:  Procedure Laterality Date   BENTALL PROCEDURE N/A 10/04/2021   Procedure: BENTALL PROCEDURE USING 25 MM KONNECT RESILIA  AORTIC VALVED CONDUIT;  Surgeon: Melrose Nakayama, MD;  Location: [place];  Service: Open Heart Surgery;  Laterality: N/A;   CORONARY ARTERY BYPASS GRAFT N/A 10/04/2021   Procedure: CORONARY ARTERY BYPASS GRAFTING (CABG) TIMES ONE USING RIGHT GREATER SAPHENOUS VEIN;  Surgeon: Melrose Nakayama, MD;  Location: [place];  Service: Open Heart Surgery;  Laterality: N/A;   DRUG INDUCED ENDOSCOPY N/A 12/09/2019   Procedure: DRUG INDUCED ENDOSCOPY;  Surgeon: Melida Quitter, MD;  Location: [place];  Service: ENT;  Laterality: N/A;   ENDOVEIN HARVEST OF GREATER SAPHENOUS VEIN Right 10/04/2021    Procedure: ENDOVEIN HARVEST OF GREATER SAPHENOUS VEIN;  Surgeon: Melrose Nakayama, MD;  Location: [place];  Service: Open Heart Surgery;  Laterality: Right;   IMPLANTATION OF HYPOGLOSSAL NERVE STIMULATOR Right 02/17/2020   Procedure: IMPLANTATION OF HYPOGLOSSAL NERVE STIMULATOR;  Surgeon: Melida Quitter, MD;  Location: [place];  Service: ENT;  Laterality: Right;   INGUINAL HERNIA REPAIR     KIDNEY STONE SURGERY     stent   REVERSE SHOULDER ARTHROPLASTY Left 03/08/2021   Procedure: REVERSE SHOULDER ARTHROPLASTY;  Surgeon: Hiram Gash, MD;  Location: [facility];  Service: Orthopedics;  Laterality: Left;   RIGHT/LEFT HEART CATH AND CORONARY ANGIOGRAPHY N/A 09/01/2021   Procedure: RIGHT/LEFT HEART CATH AND CORONARY ANGIOGRAPHY;  Surgeon: Jettie Booze, MD;  Location: [place] [facility];  Service: Cardiovascular;  Laterality: N/A;   ROTATOR CUFF REPAIR Right    TEE WITHOUT CARDIOVERSION N/A 09/01/2021   Procedure: TRANSESOPHAGEAL ECHOCARDIOGRAM (TEE);  Surgeon: Josue Hector, MD;  Location[facility];  Service: Cardiovascular;  Laterality: N/A;   TEE WITHOUT CARDIOVERSION N/A 10/04/2021   Procedure: TRANSESOPHAGEAL ECHOCARDIOGRAM (TEE);  Surgeon: Melrose Nakayama, MD;  Location: [place];  Service: Open Heart Surgery;  Laterality: N/A;    Social History   Socioeconomic History   Marital status: Married    Spouse name: [name]  Number of children: 2   Years of education: Not on file   Highest education level: Associate degree: occupational, technical, or vocational program  Occupational History   Not on file  Tobacco Use   Smoking status: Never    Passive exposure: Never   Smokeless tobacco: Never  Vaping Use   Vaping Use: Never used  Substance and Sexual Activity   Alcohol use: No    Alcohol/week: 0.0 standard drinks of alcohol   Drug use: No   Sexual activity: Yes    Comment: married  Other Topics Concern   Not on file  Social History Narrative   Lives with wife   R handed    Caffeine: 2 C of coffee, diet sun drop a day   Social Determinants of Radio broadcast assistant Strain: Not on file  Food Insecurity: Not on file  Transportation Needs: Not on file  Physical Activity: Not on file  Stress: Not on file  Social Connections: Not on file  Intimate Partner Violence: Not on file    Family History  Problem Relation Age of Onset   Hyperlipidemia Mother    Hyperlipidemia Father    Heart attack Father    Healthy Son    Healthy Brother    Other Brother    Healthy Sister    Healthy Son     Current Outpatient Medications on File Prior to Visit  Medication Sig Dispense Refill   acetaminophen (TYLENOL) 500 MG tablet Take 1,000 mg by mouth every 8 (eight) hours as needed for moderate pain.     amLODipine (NORVASC) 5 MG tablet Take 1 tablet (5 mg total) by mouth daily. 30 tablet 11   amoxicillin (AMOXIL) 500 MG capsule Take 4 capsules (2,'000mg'$ ) by mouth 30-60 minutes before dental exams/procedures 4 capsule 6   Ascorbic Acid (VITAMIN C) 1000 MG tablet Take 1,000 mg by mouth in the morning.     aspirin EC 81 MG EC tablet Take 1 tablet (81 mg total) by mouth daily. Swallow whole. 30 tablet 11   atorvastatin (LIPITOR) 40 MG tablet Take 1 tablet (40 mg total) by mouth daily. 90 tablet 3   Cholecalciferol (VITAMIN D) 50 MCG (2000 UT) tablet Take 2,000 Units by mouth daily.     Collagen-Vitamin C (SUPER COLLAGEN PLUS VITAMIN C PO) Take 1 tablet by mouth daily.     Cyanocobalamin (B-12) 5000 MCG CAPS Take 5,000 mcg by mouth daily.     docusate sodium (COLACE) 100 MG capsule Take 100 mg by mouth daily as needed for moderate constipation.     LORazepam (ATIVAN) 0.5 MG tablet Take 0.5 mg by mouth at bedtime.     magnesium oxide (MAG-OX) 400 MG tablet Take 400 mg by mouth daily.     Multiple Vitamins-Minerals (ONE-A-DAY MENS 50+ ADVANTAGE) TABS Take 1 tablet by mouth daily with breakfast.     omeprazole (PRILOSEC) 40 MG capsule Take 40 mg by mouth daily.      oxymetazoline (AFRIN) 0.05 % nasal spray Place 1 spray into both nostrils at bedtime.     Polyvinyl Alcohol-Povidone (REFRESH OP) Place 1 drop into the left eye daily. Refresh eye drops     pregabalin (LYRICA) 50 MG capsule Take 50 mg by mouth at bedtime.     rivaroxaban (XARELTO) 20 MG TABS tablet Take 1 tablet (20 mg total) by mouth daily with supper. 90 tablet 3   rOPINIRole (REQUIP) 4 MG tablet Take 4 mg by mouth at bedtime.  tamsulosin (FLOMAX) 0.4 MG CAPS capsule Take 0.4 mg by mouth 2 (two) times daily.     Testosterone Cypionate 200 MG/ML SOLN Inject 200 mg into the muscle every 14 (fourteen) days.      tiZANidine (ZANAFLEX) 2 MG tablet Take 2 mg by mouth every 6 (six) hours as needed for muscle spasms.     No current facility-administered medications on file prior to visit.    Allergies as of 10/31/2022 - Review Complete 10/31/2022  Allergen Reaction Noted   Sulfa antibiotics Itching 04/12/2016   Lotensin [benazepril hcl] Hives and Rash 03/15/2016     ROS:   General:  No weight loss, Fever, chills  HEENT: No recent headaches, no nasal bleeding, no visual changes, no sore throat  Neurologic: No dizziness, blackouts, seizures. No recent symptoms of stroke or mini- stroke. No recent episodes of slurred speech, or temporary blindness.  Cardiac: No recent episodes of chest pain/pressure, no shortness of breath at rest.  No shortness of breath with exertion.  Denies history of atrial fibrillation or irregular heartbeat  Vascular: No history of rest pain in feet.  No history of claudication.  No history of non-healing ulcer, No history of DVT   Pulmonary: No home oxygen, no productive cough, no hemoptysis,  No asthma or wheezing  Musculoskeletal:  '[ ]'$  Arthritis, '[ ]'$  Low back pain,  '[ ]'$  Joint pain  Hematologic:No history of hypercoagulable state.  No history of easy bleeding.  No history of anemia  Gastrointestinal: No hematochezia or melena,  No gastroesophageal reflux, no  trouble swallowing  Urinary: '[ ]'$  chronic Kidney disease, '[ ]'$  on HD - '[ ]'$  MWF or '[ ]'$  TTHS, '[ ]'$  Burning with urination, '[ ]'$  Frequent urination, '[ ]'$  Difficulty urinating;   Skin: No rashes  Psychological: No history of anxiety,  No history of depression  Physical Examination  Vitals:   10/31/22 1229  BP: 137/85  Pulse: 96  Resp: 20  Temp: 98.1 F (36.7 C)  TempSrc: Temporal  SpO2: 94%  Weight: 205 lb 6.4 oz (93.2 kg)  Height: '5\' 8"'$  (1.727 m)    Body mass index is 31.23 kg/m.  General:  Alert and oriented, no acute distress HEENT: Normal Neck: No bruit or JVD Pulmonary: Clear to auscultation bilaterally Cardiac: Regular Rate and Rhythm without murmur Abdomen: Soft, non-tender, non-distended, no mass, no scars Skin: No rash  Extremity Pulses:  radial,  dorsalis pedis,  pulses bilaterally Musculoskeletal: No deformity or edema  Neurologic: Upper and lower extremity motor intact and symmetric  DATA: +--------------+---------+------+-----------+------------+----------------+   LEFT         Reflux NoRefluxReflux TimeDiameter cmsComments                                  Yes                                            +--------------+---------+------+-----------+------------+----------------+   CFV                    yes   >1 second                                +--------------+---------+------+-----------+------------+----------------+   FV mid  no                                                       +--------------+---------+------+-----------+------------+----------------+   Popliteal              yes   >1 second             chronic  thrombus  +--------------+---------+------+-----------+------------+----------------+   GSV at SFJ              yes    >500 ms      0.63                       +--------------+---------+------+-----------+------------+----------------+   GSV prox thighno                             0.44                       +--------------+---------+------+-----------+------------+----------------+   GSV mid thigh no                            0.52                       +--------------+---------+------+-----------+------------+----------------+   GSV dist thighno                            0.44                       +--------------+---------+------+-----------+------------+----------------+   GSV at knee   no                            0.40                       +--------------+---------+------+-----------+------------+----------------+   GSV prox calf no                            0.39                       +--------------+---------+------+-----------+------------+----------------+   SSV Pop Fossa no                            0.21                       +--------------+---------+------+-----------+------------+----------------+       Summary:  Left:  - 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  - No evidence of superficial venous thrombosis.  - The common femoral vein and popliteal vein are not competent.  - The great saphenous vein is competent.  - The small saphenous vein is competent.     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 Office: [phone]  MD in clinic [place]

## 2022-11-01 ENCOUNTER — Encounter: Payer: Self-pay | Admitting: Physician Assistant

## 2022-11-07 ENCOUNTER — Telehealth: Payer: Self-pay | Admitting: Cardiovascular Disease

## 2022-11-07 NOTE — Telephone Encounter (Signed)
   Patient Name: Adrian Franco  DOB: Jan 30, 1946 MRN: [id number]  Primary Cardiologist: Jenkins Rouge, MD  Chart reviewed as part of pre-operative protocol coverage.   We have not been managing his anticoagulation (last Rx sent in 2018). Clearance should be sent to managing provider.   Regarding ASA therapy, we recommend continuation of ASA throughout the perioperative period.  However, if the surgeon feels that cessation of ASA is required in the perioperative period, it may be stopped 5-7 days prior to surgery with a plan to resume it as soon as felt to be feasible from a surgical standpoint in the post-operative period.    Mable Fill, Marissa Nestle, NP 11/07/2022, 12:23 PM

## 2022-11-07 NOTE — Telephone Encounter (Signed)
   Pre-operative Risk Assessment    Patient Name: Adrian Franco  DOB: 09/06/1945 MRN: [id number]      Request for Surgical Clearance    Procedure:   sclero therapy injection  Date of Surgery:  Clearance 11/27/22                                 Surgeon:  Dr. Donnetta Hutching Surgeon's Group or Practice Name:  [facility] Phone number:  [phone]  Fax number:  [phone]   Type of Clearance Requested:   - Pharmacy:  Hold Aspirin and Rivaroxaban (Xarelto) Hold for 2-3 days prior   Type of Anesthesia:  None    Additional requests/questions:  Please fax a copy of medical clearance to the surgeon's office.  [name]   11/07/2022, 11:52 AM

## 2022-11-12 ENCOUNTER — Telehealth: Payer: Self-pay | Admitting: Cardiovascular Disease

## 2022-11-12 ENCOUNTER — Telehealth: Payer: Self-pay

## 2022-11-12 NOTE — Telephone Encounter (Signed)
[facility] is calling to talk with pre-op or nurse about patient

## 2022-11-12 NOTE — Telephone Encounter (Signed)
Preoperative team,   Please contact requesting physician's office to let them know any recommendations for holding Xarelto will need to come from PCP, as they manage the patient's anticoagulation. It appears they are requesting Pharmacy recommendations for aspirin and Xarelto. They have already received aspirin recommendations from [place].   Thank you!  DW

## 2022-11-12 NOTE — Telephone Encounter (Signed)
I called the pt and he did confirm for me that PCP manages his Xarelto. I will update the pre op app.

## 2022-11-12 NOTE — Telephone Encounter (Signed)
PRE OP APP has address this. Adrian Reel, NP  to [facility] Preop Pharm     11/12/22 12:06 PM Please advise holding Xarelto prior to sclero therapy injection.  Thank you!  DW

## 2022-11-12 NOTE — Telephone Encounter (Signed)
Requested Xarelto hold for sclerotherapy through pt's Cardiologist and PCP. Awaiting response.

## 2022-11-12 NOTE — Telephone Encounter (Signed)
Patient on Xarelto for DVT/PE.Would defer to managing provider who is his PCP.

## 2022-11-12 NOTE — Telephone Encounter (Signed)
Preoperative team,   Can we please determine who is managing this patient's anticoagulation?  It appears Dr. Irish Lack refilled it last. However, he is on it for DVT/PE. Please let me know.   Thank you!  DW

## 2022-11-12 NOTE — Telephone Encounter (Signed)
I will update the requesting office that they will need to reach out to PCP Dr. [name]: ph # [phone] for clearance recommendations for the Xarelto.   We have given our instructions for the ASA and have given cardiac clearance.

## 2022-11-27 ENCOUNTER — Other Ambulatory Visit: Payer: Self-pay

## 2022-11-27 ENCOUNTER — Telehealth: Payer: Self-pay

## 2022-11-27 ENCOUNTER — Ambulatory Visit: Payer: Medicare Other | Attending: Cardiovascular Disease | Admitting: Cardiovascular Disease

## 2022-11-27 ENCOUNTER — Ambulatory Visit (INDEPENDENT_AMBULATORY_CARE_PROVIDER_SITE_OTHER): Payer: Medicare Other

## 2022-11-27 ENCOUNTER — Encounter: Payer: Self-pay | Admitting: Cardiovascular Disease

## 2022-11-27 VITALS — BP 125/80 | HR 90 | Ht 68.0 in | Wt 209.4 lb

## 2022-11-27 DIAGNOSIS — I83892 Varicose veins of left lower extremities with other complications: Secondary | ICD-10-CM

## 2022-11-27 DIAGNOSIS — R0789 Other chest pain: Secondary | ICD-10-CM | POA: Insufficient documentation

## 2022-11-27 DIAGNOSIS — R0602 Shortness of breath: Secondary | ICD-10-CM | POA: Diagnosis present

## 2022-11-27 DIAGNOSIS — I8393 Asymptomatic varicose veins of bilateral lower extremities: Secondary | ICD-10-CM

## 2022-11-27 DIAGNOSIS — I10 Essential (primary) hypertension: Secondary | ICD-10-CM

## 2022-11-27 DIAGNOSIS — R42 Dizziness and giddiness: Secondary | ICD-10-CM | POA: Diagnosis present

## 2022-11-27 MED ORDER — NITROGLYCERIN 0.4 MG SL SUBL: 0.4000 mg | SUBLINGUAL_TABLET | SUBLINGUAL | 3 refills | Status: AC | PRN

## 2022-11-27 NOTE — Patient Instructions (Addendum)
Medication Instructions:  Your physician has recommended you make the following change in your medication:   1) START taking nitroglycerin 0.4 mg as needed for chest pain. You may take 1 tablet every 15 minutes. If pain does not resolve after two tablets, take an additional tablet and head to the ER.   *If you need a refill on your cardiac medications before your next appointment, please call your pharmacy*  Lab Work: Troponin - have this done tomorrow (4/3) at [facility] If you have labs (blood work) drawn today and your tests are completely normal, you will receive your results only by: [place] (if you have MyChart) OR A paper copy in the mail If you have any lab test that is abnormal or we need to change your treatment, we will call you to review the results.  Testing/Procedures: Your physician has requested that you have an echocardiogram. Echocardiography is a painless test that uses sound waves to create images of your heart. It provides your doctor with information about the size and shape of your heart and how well your heart's chambers and valves are working. This procedure takes approximately one hour. There are no restrictions for this procedure. Please do NOT wear cologne, perfume, aftershave, or lotions (deodorant is allowed). Please arrive 15 minutes prior to your appointment time.  Your physician has requested that you have a lexiscan myoview. For further information please visit [email or web address]. Please follow instruction sheet, as given.  Follow-Up: At [facility], you and your health needs are our priority.  As part of our continuing mission to provide you with exceptional heart care, we have created designated Provider Care Teams.  These Care Teams include your primary Cardiologist (physician) and Advanced Practice Providers (APPs -  Physician Assistants and Nurse Practitioners) who all work together to provide you with the care you need, when you need  it.  Your next appointment:   3-4 weeks   Provider:   [name]

## 2022-11-27 NOTE — Progress Notes (Signed)
Cardiology Office Note:    Date:  11/27/2022   ID:  Adrian Franco, DOB [birth year], MRN [id number]  PCP:  [name], [place] Prov[facility] Cardiologist:  Jenkins Rouge, MD     Referring MD: [name], MD   Chief Complaint  Patient presents with   Shortness of Breath    History of Present Illness:    Adrian Franco is a 77 y.o. male with a hx of aortic aneurism, CVT, HTN, HLD   He called in today with more shortness of breath and is seen as a work in DOD visit   Shortness of breath and lack of energy for the past 2 weeks  Had CABG and AVR / aortic root replacemnt in Feb. 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   Avoids salt for the most part  His last echocardiogram was March, 2023.  At that time he had normal/low normal left ventricular systolic function with EF of 50 to 55%.  He had grade 1 diastolic dysfunction.  Mild mitral regurgitation.  The bioprosthetic aortic valve looked good.      Past Medical History:  Diagnosis Date   Aneurysm    Anxiety    Aortic aneurysm    followed by Dr Johnsie Cancel   Arthritis    Atypical chest pain    BPH (benign prostatic hypertrophy)    Chronic back pain    Depression    DVT (deep venous thrombosis)    GERD (gastroesophageal reflux disease)    History of kidney stones    HTN (hypertension)    Hyperlipemia    Hypogonadism in male    Insomnia    Localized edema    PE (pulmonary embolism)    Renal insufficiency    Restless leg syndrome    Sleep apnea    severe OSA-cannot tolerate CPAP   SOB (shortness of breath)     Testicular hypofunction     Past Surgical History:  Procedure Laterality Date   BENTALL PROCEDURE N/A 10/04/2021   Procedure: BENTALL PROCEDURE USING 25 MM KONNECT RESILIA  AORTIC VALVED CONDUIT;  Surgeon: Melrose Nakayama, MD;  Location: [facility];  Service: Open Heart Surgery;  Laterality: N/A;   CORONARY ARTERY BYPASS GRAFT N/A 10/04/2021   Procedure: CORONARY ARTERY BYPASS GRAFTING (CABG) TIMES ONE USING RIGHT GREATER SAPHENOUS VEIN;  Surgeon: Melrose Nakayama, MD;  Location: [facility];  Service: Open Heart Surgery;  Laterality: N/A;   DRUG INDUCED ENDOSCOPY N/A 12/09/2019   Procedure: DRUG INDUCED ENDOSCOPY;  Surgeon: Melida Quitter, MD;  Location: [facility];  Service: ENT;  Laterality: N/A;   ENDOVEIN HARVEST OF GREATER SAPHENOUS VEIN Right 10/04/2021   Procedure: ENDOVEIN HARVEST OF GREATER SAPHENOUS VEIN;  Surgeon: Melrose Nakayama, MD;  Location: [facility];  Service: Open Heart Surgery;  Laterality: Right;   IMPLANTATION OF HYPOGLOSSAL NERVE STIMULATOR Right 02/17/2020   Procedure: IMPLANTATION OF HYPOGLOSSAL NERVE STIMULATOR;  Surgeon: Melida Quitter, MD;  Location: [facility];  Service: ENT;  Laterality:  Right;   INGUINAL HERNIA REPAIR     KIDNEY STONE SURGERY     stent   REVERSE SHOULDER ARTHROPLASTY Left 03/08/2021   Procedure: REVERSE SHOULDER ARTHROPLASTY;  Surgeon: Hiram Gash, MD;  Location: [facility];  Service: Orthopedics;  Laterality: Left;   RIGHT/LEFT HEART CATH AND CORONARY ANGIOGRAPHY N/A 09/01/2021   Procedure: RIGHT/LEFT HEART CATH AND CORONARY ANGIOGRAPHY;  Surgeon: Jettie Booze, MD;  Location: [facility];  Service: Cardiovascular;  Laterality: N/A;   ROTATOR CUFF REPAIR Right    TEE WITHOUT CARDIOVERSION N/A 09/01/2021   Procedure: TRANSESOPHAGEAL ECHOCARDIOGRAM (TEE);  Surgeon: Josue Hector, MD;  Location: [facility];  Service: Cardiovascular;  Laterality: N/A;   TEE WITHOUT CARDIOVERSION N/A 10/04/2021   Procedure: TRANSESOPHAGEAL  ECHOCARDIOGRAM (TEE);  Surgeon: Melrose Nakayama, MD;  Location: [facility];  Service: Open Heart Surgery;  Laterality: N/A;    Current Medications: Current Meds  Medication Sig   acetaminophen (TYLENOL) 500 MG tablet Take 1,000 mg by mouth every 8 (eight) hours as needed for moderate pain.   amLODipine (NORVASC) 2.5 MG tablet Take 2.5 mg by mouth daily.   amoxicillin (AMOXIL) 500 MG capsule Take 4 capsules (2,000mg ) by mouth 30-60 minutes before dental exams/procedures   Ascorbic Acid (VITAMIN C) 1000 MG tablet Take 1,000 mg by mouth in the morning.   aspirin EC 81 MG EC tablet Take 1 tablet (81 mg total) by mouth daily. Swallow whole.   atorvastatin (LIPITOR) 40 MG tablet Take 1 tablet (40 mg total) by mouth daily.   Cholecalciferol (VITAMIN D) 50 MCG (2000 UT) tablet Take 2,000 Units by mouth daily.   Collagen-Vitamin C (SUPER COLLAGEN PLUS VITAMIN C PO) Take 1 tablet by mouth daily.   Cyanocobalamin (B-12) 5000 MCG CAPS Take 5,000 mcg by mouth daily.   docusate sodium (COLACE) 100 MG capsule Take 100 mg by mouth daily as needed for moderate constipation.   LORazepam (ATIVAN) 0.5 MG tablet Take 0.5 mg by mouth at bedtime.   magnesium oxide (MAG-OX) 400 MG tablet Take 400 mg by mouth daily.   Multiple Vitamins-Minerals (ONE-A-DAY MENS 50+ ADVANTAGE) TABS Take 1 tablet by mouth daily with breakfast.   omeprazole (PRILOSEC) 40 MG capsule Take 40 mg by mouth daily.   oxymetazoline (AFRIN) 0.05 % nasal spray Place 1 spray into both nostrils at bedtime.   Polyvinyl Alcohol-Povidone (REFRESH OP) Place 1 drop into the left eye daily. Refresh eye drops   pregabalin (LYRICA) 50 MG capsule Take 50 mg by mouth at bedtime.   rivaroxaban (XARELTO) 20 MG TABS tablet Take 1 tablet (20 mg total) by mouth daily with supper.   rOPINIRole (REQUIP) 4 MG tablet Take 4 mg by mouth at bedtime.   tamsulosin (FLOMAX) 0.4 MG CAPS capsule Take 0.4 mg by mouth 2 (two) times daily.   Testosterone Cypionate 200 MG/ML  SOLN Inject 200 mg into the muscle every 14 (fourteen) days.    tiZANidine (ZANAFLEX) 2 MG tablet Take 2 mg by mouth every 6 (six) hours as needed for muscle spasms.     Allergies:   Sulfa antibiotics and Lotensin [benazepril hcl]   Social History   Socioeconomic History   Marital status: Married    Spouse name: [name]   Number of children: 2   Years of education: Not on file   Highest education level: Associate degree: occupational, Hotel manager, or vocational program  Occupational History   Not on file  Tobacco Use   Smoking status: Never  Passive exposure: Never   Smokeless tobacco: Never  Vaping Use   Vaping Use: Never used  Substance and Sexual Activity   Alcohol use: No    Alcohol/week: 0.0 standard drinks of alcohol   Drug use: No   Sexual activity: Yes    Comment: married  Other Topics Concern   Not on file  Social History Narrative   Lives with wife   R handed   Caffeine: 2 C of coffee, diet sun drop a day   Social Determinants of Radio broadcast assistant Strain: Not on file  Food Insecurity: Not on file  Transportation Needs: Not on file  Physical Activity: Not on file  Stress: Not on file  Social Connections: Not on file     Family History: The patient's family history includes Healthy in his brother, sister, son, and son; Heart attack in his father; Hyperlipidemia in his father and mother; Other in his brother.  ROS:   Please see the history of present illness.     All other systems reviewed and are negative.  EKGs/Labs/Other Studies Reviewed:    The following studies were reviewed today:   EKG:  November 27, 2022:   NSR at 90.  INc. RBBB . Voltage criteria for LVH ? Ant. Septal MI -Q waves V1-V2 - could be lead placement abnormality    Recent Labs: 02/08/2022: Hemoglobin 12.9; Platelets 233 03/28/2022: ALT 9; BUN 14; Creatinine, Ser 1.23; Potassium 4.6; Sodium 138  Recent Lipid Panel    Component Value Date/Time   CHOL 190 02/08/2022 1127    TRIG 261 (H) 02/08/2022 1127   HDL 55 02/08/2022 1127   CHOLHDL 3.5 02/08/2022 1127   LDLCALC 92 02/08/2022 1127     Risk Assessment/Calculations:                Physical Exam:    VS:  BP 125/80   Pulse 90   Ht 5\' 8"  (1.727 m)   Wt 209 lb 6.4 oz (95 kg)   SpO2 98%   BMI 31.84 kg/m     Wt Readings from Last 3 Encounters:  11/27/22 209 lb 6.4 oz (95 kg)  10/31/22 205 lb 6.4 oz (93.2 kg)  03/28/22 202 lb (91.6 kg)     GEN:  Well nourished, well developed in no acute distress HEENT: Normal NECK: No JVD; No carotid bruits LYMPHATICS: No lymphadenopathy CARDIAC: RR , very soft systolic murmur c/w prosthetic AV  RESPIRATORY:  Clear to auscultation without rales, wheezing or rhonchi  ABDOMEN: Soft, non-tender, non-distended MUSCULOSKELETAL:  No edema; No deformity  SKIN: 2 tick bite marks, 1 on left arm, 1 on left knee.  Do not look infected.  No target shaped rash .  They are itchy  NEUROLOGIC:  Alert and oriented x 3 PSYCHIATRIC:  Normal affect   ASSESSMENT:    No diagnosis found. PLAN:       Shortness of breath :  is euvolumic at present .  Has some orthostatis  I doubt this is volume .  I hesitate to give last at this point since this would worsen his orthostasis      ECG shows ant. Septal Q waves - ? Old  MI vs. Lead placement abn. Will get myoview, echo, troponin  Will also get CBC, BMP, TSH   2.  CABG - has had some chest pressure.   See above ,  will get troponin, echo, lexiscan myoview   3.  AVR / aortic root replacemnt :  4.  Tick bites:   his symptoms preceded his tick bites.  Does not look like RMSF or Lyme disease   .  The bites look ok.  No target rash.   Doubt this is a tick related illness symptoms started earlier       Shared Decision Making/Informed Consent The risks [chest pain, shortness of breath, cardiac arrhythmias, dizziness, blood pressure fluctuations, myocardial infarction, stroke/transient ischemic attack, nausea, vomiting,  allergic reaction, radiation exposure, metallic taste sensation and life-threatening complications (estimated to be 1 in 10,000)], benefits (risk stratification, diagnosing coronary artery disease, treatment guidance) and alternatives of a nuclear stress test were discussed in detail with Mr. Mom and he agrees to proceed.    Medication Adjustments/Labs and Tests Ordered: Current medicines are reviewed at length with the patient today.  Concerns regarding medicines are outlined above.  No orders of the defined types were placed in this encounter.  No orders of the defined types were placed in this encounter.   There are no Patient Instructions on file for this visit.   Signed, Mertie Moores, MD  11/27/2022 4:41 PM    Center

## 2022-11-27 NOTE — Telephone Encounter (Signed)
Called patient's wife. Made patient an appointment with the DOD today since patient is having symptoms of SOB. Patient's wife agreed to plan.     Adrian Spanish "Adrian Franco"  Pam, I called back and you were in with a patient.  We have an appointment  in [place] at 11:30, we may can come around 2:00 if that would work..  Call us at [phone], we will be leaving in a bit and I won't have access to this email. Thank You, Lucky Rathke, RN Hello [name],   I tried to call, so I could schedule Adrian Franco to see the Doctor of the Day today. Please call our office, so we can get him scheduled to see someone.    Thank you, Pam, RN  Adrian Spanish "Adrian Franco" Adrian Franco was last seen by you on 01/25/22 and we are to have another appointment in June according to what I see on line.  However, Adrian Franco is having some shortness of breath.  It's not major but we did want to let you know in case you think he needs to be seen sooner. Thank You!!   [name]

## 2022-11-27 NOTE — Telephone Encounter (Signed)
Called patient's wife. Made patient an appointment with the DOD today since patient is having symptoms of SOB. Patient's wife agreed to plan.

## 2022-11-27 NOTE — Progress Notes (Signed)
Treated pt's LLE bleeding reticular vein and area of spider veins/small reticular veins around it, with Asclera 1% administered with a 27g butterfly.  Patient received a total of 4 mL (40 mg). Pt tolerated well. Easy access. Pt given post treatment care instructions on handout and verbally. Will call if he has any questions/concerns.    Photos: Yes.    Compression stockings applied: Yes.

## 2022-11-28 ENCOUNTER — Encounter: Payer: Self-pay | Admitting: Cardiovascular Disease

## 2022-11-28 LAB — BASIC METABOLIC PANEL
BUN/Creatinine Ratio: 14 (ref 10–24)
BUN: 15 mg/dL (ref 8–27)
CO2: 23 mmol/L (ref 20–29)
Calcium: 9.4 mg/dL (ref 8.6–10.2)
Chloride: 104 mmol/L (ref 96–106)
Creatinine, Ser: 1.11 mg/dL (ref 0.76–1.27)
Glucose: 121 mg/dL — ABNORMAL HIGH (ref 70–99)
Potassium: 4.1 mmol/L (ref 3.5–5.2)
Sodium: 143 mmol/L (ref 134–144)
eGFR: 69 mL/min/{1.73_m2} (ref >59)

## 2022-11-28 LAB — CBC
Hematocrit: 44.6 % (ref 37.5–51.0)
Hemoglobin: 14.7 g/dL (ref 13.0–17.7)
MCH: 29.3 pg (ref 26.6–33.0)
MCHC: 33 g/dL (ref 31.5–35.7)
MCV: 89 fL (ref 79–97)
Platelets: 199 10*3/uL (ref 150–450)
RBC: 5.02 x10E6/uL (ref 4.14–5.80)
RDW: 13.2 % (ref 11.6–15.4)
WBC: 6.2 10*3/uL (ref 3.4–10.8)

## 2022-11-28 LAB — TSH: TSH: 1.31 u[IU]/mL (ref 0.450–4.500)

## 2022-11-29 LAB — TROPONIN T: Troponin T (Highly Sensitive): 9 ng/L (ref 0–22)

## 2022-12-01 ENCOUNTER — Other Ambulatory Visit: Payer: Self-pay | Admitting: Cardiovascular Disease

## 2022-12-07 ENCOUNTER — Telehealth: Payer: Self-pay

## 2022-12-07 NOTE — Telephone Encounter (Signed)
Caller: [name], pt's wife  Concern: Area injected is warm and extremely painful  Location: left leg  Description: gradual, worsening since Saturday  Aggravating Factors: touching  Quality: burning, pressure, shooting, and uncomfortable  Treatments:  Instructed pt to take OTC pain meds, use compression stockings/ACE wrap, warm compress, and compress hematoma  Resolution:  Pt sending pics via Mychart, will continue to monitor and call back on Monday if symptoms persist or worsen If symptoms get severe, pt to go to urgent care/ED  Next Appt: Not at this time

## 2022-12-07 NOTE — Telephone Encounter (Signed)
After pt sent pics, instructed him to go to urgent care. There appears to be some skin breakdown and larger blood blisters. With the pain that he's experiencing, he may need some antibiotics or treatment. Replied to pt Mychart msg.

## 2022-12-08 ENCOUNTER — Emergency Department (HOSPITAL_COMMUNITY)
Admission: EM | Admit: 2022-12-08 | Discharge: 2022-12-08 | Disposition: A | Discharge status: Home / Self Care | Payer: Medicare Other | Attending: Emergency Medicine | Admitting: Emergency Medicine

## 2022-12-08 ENCOUNTER — Other Ambulatory Visit: Payer: Self-pay

## 2022-12-08 DIAGNOSIS — Z7982 Long term (current) use of aspirin: Secondary | ICD-10-CM | POA: Insufficient documentation

## 2022-12-08 DIAGNOSIS — L03116 Cellulitis of left lower limb: Secondary | ICD-10-CM | POA: Diagnosis not present

## 2022-12-08 DIAGNOSIS — Z79899 Other long term (current) drug therapy: Secondary | ICD-10-CM | POA: Insufficient documentation

## 2022-12-08 DIAGNOSIS — Z7901 Long term (current) use of anticoagulants: Secondary | ICD-10-CM | POA: Diagnosis not present

## 2022-12-08 DIAGNOSIS — M25572 Pain in left ankle and joints of left foot: Secondary | ICD-10-CM | POA: Diagnosis present

## 2022-12-08 MED ORDER — DOXYCYCLINE HYCLATE 100 MG PO CAPS: 100.0000 mg | ORAL_CAPSULE | Freq: Two times a day (BID) | ORAL | 0 refills | Status: DC

## 2022-12-08 NOTE — ED Provider Notes (Signed)
[place] EMERGENCY DEPARTMENT AT [facility] Provider Note   CSN: [id number] Arrival date & time: 12/08/22  1442     History  Chief Complaint  Patient presents with   Ankle Pain    Adrian Franco is a 77 y.o. male who presents ambulatory to ED complaining left ankle pain after sclerosing therapy. Patient arrived to vein clinic where MD referred him to Ed concerned for DVT vs cellulitis vs septic joint. Denies fever, chills, chest pain, dyspnea, abdominal pain, extremity paresthesias.   Ankle Pain      Home Medications Prior to Admission medications   Medication Sig Start Date End Date Taking? Authorizing Provider  acetaminophen (TYLENOL) 500 MG tablet Take 1,000 mg by mouth every 8 (eight) hours as needed for moderate pain.    [provider]  amLODipine (NORVASC) 2.5 MG tablet Take 2.5 mg by mouth daily. 11/23/22   [provider]  amoxicillin (AMOXIL) 500 MG capsule Take 4 capsules (2,000mg ) by mouth 30-60 minutes before dental exams/procedures 01/30/22   Wendall Stade, MD  Ascorbic Acid (VITAMIN C) 1000 MG tablet Take 1,000 mg by mouth in the morning.    [provider]  aspirin EC 81 MG EC tablet Take 1 tablet (81 mg total) by mouth daily. Swallow whole. 10/10/21   Ardelle Balls, PA-C  atorvastatin (LIPITOR) 40 MG tablet TAKE 1 TABLET EVERY DAY 12/03/22   Nahser, Deloris Ping, MD  Cholecalciferol (VITAMIN D) 50 MCG (2000 UT) tablet Take 2,000 Units by mouth daily.    [provider]  Collagen-Vitamin C (SUPER COLLAGEN PLUS VITAMIN C PO) Take 1 tablet by mouth daily.    [provider]  Cyanocobalamin (B-12) 5000 MCG CAPS Take 5,000 mcg by mouth daily.    [provider]  docusate sodium (COLACE) 100 MG capsule Take 100 mg by mouth daily as needed for moderate constipation.    [provider]  doxycycline (VIBRAMYCIN) 100 MG capsule Take 1 capsule (100 mg total) by mouth 2 (two) times daily. 12/08/22    Eber Hong, MD  LORazepam (ATIVAN) 0.5 MG tablet Take 0.5 mg by mouth at bedtime. 02/03/21   [provider]  magnesium oxide (MAG-OX) 400 MG tablet Take 400 mg by mouth daily.    [provider]  Multiple Vitamins-Minerals (ONE-A-DAY MENS 50+ ADVANTAGE) TABS Take 1 tablet by mouth daily with breakfast.    [provider]  nitroGLYCERIN (NITROSTAT) 0.4 MG SL tablet Place 1 tablet (0.4 mg total) under the tongue every 5 (five) minutes as needed for chest pain. 11/27/22 02/25/23  Nahser, Deloris Ping, MD  omeprazole (PRILOSEC) 40 MG capsule Take 40 mg by mouth daily.    [provider]  oxymetazoline (AFRIN) 0.05 % nasal spray Place 1 spray into both nostrils at bedtime.    [provider]  Polyvinyl Alcohol-Povidone (REFRESH OP) Place 1 drop into the left eye daily. Refresh eye drops    [provider]  pregabalin (LYRICA) 50 MG capsule Take 50 mg by mouth at bedtime.    [provider]  rivaroxaban (XARELTO) 20 MG TABS tablet Take 1 tablet (20 mg total) by mouth daily with supper. 09/02/21   Corky Crafts, MD  rOPINIRole (REQUIP) 4 MG tablet Take 4 mg by mouth at bedtime.    [provider]  tamsulosin (FLOMAX) 0.4 MG CAPS capsule Take 0.4 mg by mouth 2 (two) times daily.    [provider]  Testosterone Cypionate 200 MG/ML  SOLN Inject 200 mg into the muscle every 14 (fourteen) days.     [provider]  tiZANidine (ZANAFLEX) 2 MG tablet Take 2 mg by mouth every 6 (six) hours as needed for muscle spasms. 06/02/21   [provider]      Allergies    Sulfa antibiotics and Lotensin [benazepril hcl]    Review of Systems   Review of Systems  Physical Exam Updated Vital Signs BP (!) 152/105 (BP Location: Right Arm)   Pulse 84   Temp (!) 97.5 F (36.4 C) (Oral)   Resp 18   Ht 5\' 8"  (1.727 m)   Wt 94.8 kg   SpO2 96%   BMI 31.78 kg/m  Physical Exam Vitals and nursing note reviewed.   Constitutional:      General: He is not in acute distress.    Appearance: Normal appearance. He is not ill-appearing or toxic-appearing.  HENT:     Head: Normocephalic and atraumatic.     Mouth/Throat:     Mouth: Mucous membranes are moist.     Pharynx: No oropharyngeal exudate or posterior oropharyngeal erythema.  Eyes:     General: No scleral icterus.       Right eye: No discharge.        Left eye: No discharge.     Conjunctiva/sclera: Conjunctivae normal.  Cardiovascular:     Rate and Rhythm: Normal rate.     Pulses: Normal pulses.     Heart sounds: No murmur heard. Pulmonary:     Effort: Pulmonary effort is normal. No respiratory distress.     Breath sounds: No wheezing, rhonchi or rales.  Abdominal:     Tenderness: There is no abdominal tenderness.  Musculoskeletal:        General: Normal range of motion.     Right lower leg: No edema.     Left lower leg: No edema.     Comments: Foot and Ankle ROM intact. Patient with skin changes consistent with vein sclerosing. Small <1cm area of skin breakdown on medial malleolus without bleeding or purulence. No warmth or erythema of medial malleolus.  Skin:    General: Skin is warm and dry.     Findings: No rash.  Neurological:     General: No focal deficit present.     Mental Status: He is alert. Mental status is at baseline.  Psychiatric:        Mood and Affect: Mood normal.     ED Results / Procedures / Treatments   Labs (all labs ordered are listed, but only abnormal results are displayed) Labs Reviewed - No data to display  EKG None  Radiology No results found.  Procedures Procedures    Medications Ordered in ED Medications - No data to display  ED Course/ Medical Decision Making/ A&P                             Medical Decision Making  This patient presents to the ED for concern of left ankle pain, this involves an extensive number of treatment options, and is a complaint that carries with it a high risk  of complications and morbidity.  The differential diagnosis includes DVT, cellulitis, septic joint, abscess   Co morbidities that complicate the patient evaluation  none   Additional history obtained:  none   Problem List / ED Course / Critical interventions / Medication management  Patient referred to ED from vein clinic appointment  complaining of left ankle pain concerning for DVT vs cellulitis vs osteomyelitis/septic joint vs abscess. On physical exam, patient is ambulatory and has full ROM of ankle and foot. LLE well vascularized and without paresthesias. Patient afebrile and left ankle without warmth or erythema. All of these physical exam findings make me less concerned of septic joint at this time, so I did not find it necessary to obtain imaging. Patient also endorses compliance on Xarelto, which makes me less concerned for DVT. I have provided patient with a course of abx for cellulitis and requested him to follow up with vein clinic. Patient verbally agreed to plan. Patient is stable at this time and ready for discharge. Return precautions provided.    Social Determinants of Health:  none         Final Clinical Impression(s) / ED Diagnoses Final diagnoses:  Cellulitis of left lower extremity    Rx / DC Orders ED Discharge Orders          Ordered    doxycycline (VIBRAMYCIN) 100 MG capsule  2 times daily,   Status:  Discontinued        12/08/22 1534    doxycycline (VIBRAMYCIN) 100 MG capsule  2 times daily,   Status:  Discontinued        12/08/22 1547    doxycycline (VIBRAMYCIN) 100 MG capsule  2 times daily        12/08/22 1620              Dorthy Cooler, [place] 12/08/22 1700    Eber Hong, MD 12/10/22 2028

## 2022-12-08 NOTE — ED Triage Notes (Signed)
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 Patient has hx of DVT in same extremity. MD referred patient to ED for possible ABX therapy.

## 2022-12-08 NOTE — ED Provider Triage Note (Signed)
Emergency Medicine Provider Triage Evaluation Note  CRISTIANO AKKERMAN , a 77 y.o. male  was evaluated in triage.  Pt complains of left ankle pain after sclerosing vein injection on 4/2. Patient arrived to MD appointment and they referred patient to waiting room to further assess for DVT vs cellulitis vs abscess. Past history of blood clots and compliant on Xarelto.  Review of Systems  Positive: Left ankle pain Negative: Fever, chills, headache, chest pain, abdominal pain, extremity paresthesias, weakness  Physical Exam  BP (!) 152/105 (BP Location: Right Arm)   Pulse 84   Temp (!) 97.5 F (36.4 C) (Oral)   Resp 18   Ht 5\' 8"  (1.727 m)   Wt 94.8 kg   SpO2 96%   BMI 31.78 kg/m  Gen:   Awake, no distress   Resp:  Normal effort  MSK:   Moves extremities without difficulty, 2+ pedal pulses  Other:    Medical Decision Making  Medically screening exam initiated at 3:12 PM.  Appropriate orders placed.  [name] was informed that the remainder of the evaluation will be completed by another provider, this initial triage assessment does not replace that evaluation, and the importance of remaining in the ED until their evaluation is complete.    Dorthy Cooler, [place] 12/08/22 1521

## 2022-12-08 NOTE — Discharge Instructions (Signed)
It was a pleasure taking care of you today. I have sent a prescription to your pharmacy for an antibiotic. Seek emergency care if experiencing any new or worsening symptoms.  Doxycycline 100mg  by mouth twice daily until the medicine is completely finished - this medicine is a strong antibiotic that treats certain infections.

## 2022-12-10 ENCOUNTER — Telehealth: Payer: Self-pay

## 2022-12-10 NOTE — Telephone Encounter (Signed)
Called pt after he went to ED over weekend for painful area on leg after sclerotherapy. He is on day 2 of antibiotics for cellulitis. He thinks area may be a little better but is unsure. He has been given a f/u appt with MD, as he requested and has been advised to try taking Benadryl. Pt verbalized understanding.

## 2022-12-12 ENCOUNTER — Encounter: Payer: Self-pay | Admitting: Vascular Surgery

## 2022-12-12 ENCOUNTER — Ambulatory Visit (INDEPENDENT_AMBULATORY_CARE_PROVIDER_SITE_OTHER): Payer: Medicare Other | Admitting: Vascular Surgery

## 2022-12-12 VITALS — BP 130/74 | HR 75 | Temp 98.0°F | Resp 20 | Wt 207.9 lb

## 2022-12-12 DIAGNOSIS — I809 Phlebitis and thrombophlebitis of unspecified site: Secondary | ICD-10-CM | POA: Diagnosis not present

## 2022-12-12 DIAGNOSIS — T8172XA Complication of vein following a procedure, not elsewhere classified, initial encounter: Secondary | ICD-10-CM | POA: Diagnosis not present

## 2022-12-12 NOTE — Progress Notes (Signed)
Patient ID: Adrian Franco, male   DOB: 1946-06-06, 77 y.o.   MRN: [id number]  Reason for Consult: Follow-up   Referred by Adrian Deem, MD  Subjective:     HPI:  Adrian Franco is a 77 y.o. male recent underwent sclerotherapy for bleeding left reticular veins.  4 days after this he developed severe pain in the left ankle with warmth and was started on doxycycline at urgent care.  He now follows up for further evaluation.  He states that he still has pain around the area of the injection.  He denies any fevers or chills.  He says it feels warm to the touch.  He has been intermittently wearing compression socks but says that they do cause discomfort and he has only been able to wear boots.   Past Medical History:  Diagnosis Date   Aneurysm    Anxiety    Aortic aneurysm    followed by Dr Eden Emms   Arthritis    Atypical chest pain    BPH (benign prostatic hypertrophy)    Chronic back pain    Depression    DVT (deep venous thrombosis)    GERD (gastroesophageal reflux disease)    History of kidney stones    HTN (hypertension)    Hyperlipemia    Hypogonadism in male    Insomnia    Localized edema    PE (pulmonary embolism)    Renal insufficiency    Restless leg syndrome    Sleep apnea    severe OSA-cannot tolerate CPAP   SOB (shortness of breath)    Testicular hypofunction    Family History  Problem Relation Age of Onset   Hyperlipidemia Mother    Hyperlipidemia Father    Heart attack Father    Healthy Son    Healthy Brother    Other Brother    Healthy Sister    Healthy Son    Past Surgical History:  Procedure Laterality Date   BENTALL PROCEDURE N/A 10/04/2021   Procedure: BENTALL PROCEDURE USING 25 MM KONNECT RESILIA  AORTIC VALVED CONDUIT;  Surgeon: Adrian Slot, MD;  Location: [facility] OR;  Service: Open Heart Surgery;  Laterality: N/A;   CORONARY ARTERY BYPASS GRAFT N/A 10/04/2021   Procedure: CORONARY ARTERY BYPASS GRAFTING (CABG) TIMES ONE USING RIGHT  GREATER SAPHENOUS VEIN;  Surgeon: Adrian Slot, MD;  Location: [facility] OR;  Service: Open Heart Surgery;  Laterality: N/A;   DRUG INDUCED ENDOSCOPY N/A 12/09/2019   Procedure: DRUG INDUCED ENDOSCOPY;  Surgeon: Adrian Reading, MD;  Location: [facility];  Service: ENT;  Laterality: N/A;   ENDOVEIN HARVEST OF GREATER SAPHENOUS VEIN Right 10/04/2021   Procedure: ENDOVEIN HARVEST OF GREATER SAPHENOUS VEIN;  Surgeon: Adrian Slot, MD;  Location: [facility] OR;  Service: Open Heart Surgery;  Laterality: Right;   IMPLANTATION OF HYPOGLOSSAL NERVE STIMULATOR Right 02/17/2020   Procedure: IMPLANTATION OF HYPOGLOSSAL NERVE STIMULATOR;  Surgeon: Adrian Reading, MD;  Location: [facility] OR;  Service: ENT;  Laterality: Right;   INGUINAL HERNIA REPAIR     KIDNEY STONE SURGERY     stent   REVERSE SHOULDER ARTHROPLASTY Left 03/08/2021   Procedure: REVERSE SHOULDER ARTHROPLASTY;  Surgeon: Adrian Pippin, MD;  Location: [facility] ORS;  Service: Orthopedics;  Laterality: Left;   RIGHT/LEFT HEART CATH AND CORONARY ANGIOGRAPHY N/A 09/01/2021   Procedure: RIGHT/LEFT HEART CATH AND CORONARY ANGIOGRAPHY;  Surgeon: Adrian Crafts, MD;  Location: [facility] INVASIVE CV LAB;  Service: Cardiovascular;  Laterality: N/A;  ROTATOR CUFF REPAIR Right    TEE WITHOUT CARDIOVERSION N/A 09/01/2021   Procedure: TRANSESOPHAGEAL ECHOCARDIOGRAM (TEE);  Surgeon: Adrian Stade, MD;  Location: [facility] ENDOSCOPY;  Service: Cardiovascular;  Laterality: N/A;   TEE WITHOUT CARDIOVERSION N/A 10/04/2021   Procedure: TRANSESOPHAGEAL ECHOCARDIOGRAM (TEE);  Surgeon: Adrian Slot, MD;  Location: [facility] OR;  Service: Open Heart Surgery;  Laterality: N/A;    Short Social History:  Social History   Tobacco Use   Smoking status: Never    Passive exposure: Never   Smokeless tobacco: Never  Substance Use Topics   Alcohol use: No    Alcohol/week: 0.0 standard drinks of alcohol    Allergies  Allergen Reactions   Sulfa Antibiotics Itching   Lotensin  [Benazepril Hcl] Hives and Rash    Current Outpatient Medications  Medication Sig Dispense Refill   acetaminophen (TYLENOL) 500 MG tablet Take 1,000 mg by mouth every 8 (eight) hours as needed for moderate pain.     amLODipine (NORVASC) 2.5 MG tablet Take 2.5 mg by mouth daily.     Ascorbic Acid (VITAMIN C) 1000 MG tablet Take 1,000 mg by mouth in the morning.     aspirin EC 81 MG EC tablet Take 1 tablet (81 mg total) by mouth daily. Swallow whole. 30 tablet 11   atorvastatin (LIPITOR) 40 MG tablet TAKE 1 TABLET EVERY DAY 90 tablet 3   Cholecalciferol (VITAMIN D) 50 MCG (2000 UT) tablet Take 2,000 Units by mouth daily.     Collagen-Vitamin C (SUPER COLLAGEN PLUS VITAMIN C PO) Take 1 tablet by mouth daily.     Cyanocobalamin (B-12) 5000 MCG CAPS Take 5,000 mcg by mouth daily.     docusate sodium (COLACE) 100 MG capsule Take 100 mg by mouth daily as needed for moderate constipation.     doxycycline (VIBRAMYCIN) 100 MG capsule Take 1 capsule (100 mg total) by mouth 2 (two) times daily. 14 capsule 0   LORazepam (ATIVAN) 0.5 MG tablet Take 0.5 mg by mouth at bedtime.     LYRICA 75 MG capsule Take 75 mg by mouth daily.     magnesium oxide (MAG-OX) 400 MG tablet Take 400 mg by mouth daily.     Multiple Vitamins-Minerals (ONE-A-DAY MENS 50+ ADVANTAGE) TABS Take 1 tablet by mouth daily with breakfast.     nitroGLYCERIN (NITROSTAT) 0.4 MG SL tablet Place 1 tablet (0.4 mg total) under the tongue every 5 (five) minutes as needed for chest pain. 25 tablet 3   omeprazole (PRILOSEC) 40 MG capsule Take 40 mg by mouth daily.     oxymetazoline (AFRIN) 0.05 % nasal spray Place 1 spray into both nostrils at bedtime.     Polyvinyl Alcohol-Povidone (REFRESH OP) Place 1 drop into the left eye daily. Refresh eye drops     rivaroxaban (XARELTO) 20 MG TABS tablet Take 1 tablet (20 mg total) by mouth daily with supper. 90 tablet 3   rOPINIRole (REQUIP) 4 MG tablet Take 4 mg by mouth at bedtime.     tamsulosin (FLOMAX)  0.4 MG CAPS capsule Take 0.4 mg by mouth 2 (two) times daily.     Testosterone Cypionate 200 MG/ML SOLN Inject 200 mg into the muscle every 14 (fourteen) days.      tiZANidine (ZANAFLEX) 2 MG tablet Take 2 mg by mouth every 6 (six) hours as needed for muscle spasms.     amoxicillin (AMOXIL) 500 MG capsule Take 4 capsules (2,000mg ) by mouth 30-60 minutes before dental exams/procedures (Patient not taking:  Reported on 12/12/2022) 4 capsule 6   No current facility-administered medications for this visit.    Review of Systems  Constitutional:  Constitutional negative. HENT: HENT negative.  Eyes: Eyes negative.  Respiratory: Respiratory negative.  Cardiovascular: Positive for leg swelling.  GI: Gastrointestinal negative.  Musculoskeletal: Positive for leg pain.  Neurological: Neurological negative. Hematologic: Hematologic/lymphatic negative.  Psychiatric: Psychiatric negative.        Objective:  Objective   Vitals:   12/12/22 1102  BP: 130/74  Pulse: 75  Resp: 20  Temp: 98 F (36.7 C)  SpO2: 93%  Weight: 207 lb 14.4 oz (94.3 kg)   Body mass index is 31.61 kg/m.  Physical Exam HENT:     Head: Normocephalic.     Nose: Nose normal.  Eyes:     Pupils: Pupils are equal, round, and reactive to light.  Cardiovascular:     Rate and Rhythm: Normal rate.  Pulmonary:     Effort: Pulmonary effort is normal.  Abdominal:     General: Abdomen is flat.  Musculoskeletal:     Comments: Left ankle ttp  Skin:    Capillary Refill: Capillary refill takes less than 2 seconds.  Neurological:     General: No focal deficit present.     Mental Status: He is alert.  Psychiatric:        Mood and Affect: Mood normal.     Data: No studies     Assessment/Plan:    77yo male sclerotherapy left ankle now with pain and warmth to the left ankle but no evidence of cellulitis.  Pain is most consistent with thrombophlebitis I have recommended warm compresses, nonsteroidal and compression   stockings as tolerated and I will have a phone visit with him in 2 weeks in the setting of issues before that.      [name] MD [facility]

## 2022-12-17 ENCOUNTER — Encounter: Payer: Self-pay | Admitting: Vascular Surgery

## 2022-12-17 ENCOUNTER — Telehealth: Payer: Self-pay

## 2022-12-17 NOTE — Telephone Encounter (Signed)
Spoke to pt's wife regarding MyChart message she sent. Pt is c/o pain and small amount of occasional drainage from area that had bleeding vein. MD is aware and has advised pt continue warm compresses, NSAIDs and compression. Pt has been doing the warm compresses 1-2/daily. Pt will continue to monitor this area and let us know if anything changes.

## 2022-12-19 ENCOUNTER — Telehealth (HOSPITAL_COMMUNITY): Payer: Self-pay | Admitting: *Deleted

## 2022-12-19 NOTE — Telephone Encounter (Signed)
Per DPR left detailed instructions for MPI study on home answering machine.

## 2022-12-20 ENCOUNTER — Telehealth: Payer: Self-pay

## 2022-12-20 NOTE — Telephone Encounter (Signed)
Called pt to f/u on his leg and he is feeling like it is overall improving. He states it looks better and the swelling has improved. He states the pain is better but he still has some pain if something touches that area. He has been advised to continue warm compress, elevation and compression. No further questions/concerns at this time.

## 2022-12-24 ENCOUNTER — Telehealth: Payer: Self-pay

## 2022-12-24 NOTE — Telephone Encounter (Signed)
Called pt to f/u-he feels overall much improvement. Leg pain and swelling is resolving. He states he occasionally gets a "shooting pain" his leg and it sounds like it comes and goes quickly. He stated his leg looks much better. Pt does still wish to keep his phone visit f/u with MD this week. No questions/concerns at this time.

## 2022-12-25 ENCOUNTER — Ambulatory Visit: Payer: Medicare Other | Admitting: Cardiovascular Disease

## 2022-12-26 ENCOUNTER — Ambulatory Visit (INDEPENDENT_AMBULATORY_CARE_PROVIDER_SITE_OTHER): Payer: Medicare Other | Admitting: Vascular Surgery

## 2022-12-26 ENCOUNTER — Encounter: Payer: Self-pay | Admitting: Vascular Surgery

## 2022-12-26 DIAGNOSIS — T8172XA Complication of vein following a procedure, not elsewhere classified, initial encounter: Secondary | ICD-10-CM

## 2022-12-26 DIAGNOSIS — I809 Phlebitis and thrombophlebitis of unspecified site: Secondary | ICD-10-CM

## 2022-12-26 NOTE — Progress Notes (Signed)
Virtual Visit via Telephone Note  I connected with Adrian Franco on 12/26/2022 using the [employer] by telephone and verified that I was speaking with the correct person using two identifiers. [place] and I was in the office alone   The limitations of evaluation and management by telemedicine and the availability of in person appointments have been previously discussed with the patient and are documented in the patients chart. The patient expressed understanding and consented to proceed.  Chief Complaint: Left ankle infection and pain  History of Present Illness: Adrian Franco is a 77 y.o. male with status post left ankle sclerotherapy for bleeding reticular veins who has been followed up for thrombophlebitis.  At last visit we recommended nonsteroidal anti-inflammatories, continue compression stockings and warm compresses as necessary.  He now states the pain is much improved and is 100 times better than it previously was.  Patient requesting to not take ibuprofen any longer.  Past Medical History:  Diagnosis Date   Aneurysm (HCC)    Anxiety    Aortic aneurysm (HCC)    followed by Dr Eden Emms   Arthritis    Atypical chest pain    BPH (benign prostatic hypertrophy)    Chronic back pain    Depression    DVT (deep venous thrombosis) (HCC)    GERD (gastroesophageal reflux disease)    History of kidney stones    HTN (hypertension)    Hyperlipemia    Hypogonadism in male    Insomnia    Localized edema    PE (pulmonary embolism)    Renal insufficiency    Restless leg syndrome    Sleep apnea    severe OSA-cannot tolerate CPAP   SOB (shortness of breath)    Testicular hypofunction     Past Surgical History:  Procedure Laterality Date   BENTALL PROCEDURE N/A 10/04/2021   Procedure: BENTALL PROCEDURE USING 25 MM KONNECT RESILIA  AORTIC VALVED CONDUIT;  Surgeon: Loreli Slot, MD;  Location: [facility] OR;  Service: Open Heart Surgery;  Laterality:  N/A;   CORONARY ARTERY BYPASS GRAFT N/A 10/04/2021   Procedure: CORONARY ARTERY BYPASS GRAFTING (CABG) TIMES ONE USING RIGHT GREATER SAPHENOUS VEIN;  Surgeon: Loreli Slot, MD;  Location: [facility] OR;  Service: Open Heart Surgery;  Laterality: N/A;   DRUG INDUCED ENDOSCOPY N/A 12/09/2019   Procedure: DRUG INDUCED ENDOSCOPY;  Surgeon: Christia Reading, MD;  Location: [facility];  Service: ENT;  Laterality: N/A;   ENDOVEIN HARVEST OF GREATER SAPHENOUS VEIN Right 10/04/2021   Procedure: ENDOVEIN HARVEST OF GREATER SAPHENOUS VEIN;  Surgeon: Loreli Slot, MD;  Location: [facility] OR;  Service: Open Heart Surgery;  Laterality: Right;   IMPLANTATION OF HYPOGLOSSAL NERVE STIMULATOR Right 02/17/2020   Procedure: IMPLANTATION OF HYPOGLOSSAL NERVE STIMULATOR;  Surgeon: Christia Reading, MD;  Location: [facility] OR;  Service: ENT;  Laterality: Right;   INGUINAL HERNIA REPAIR     KIDNEY STONE SURGERY     stent   REVERSE SHOULDER ARTHROPLASTY Left 03/08/2021   Procedure: REVERSE SHOULDER ARTHROPLASTY;  Surgeon: Bjorn Pippin, MD;  Location: [facility] ORS;  Service: Orthopedics;  Laterality: Left;   RIGHT/LEFT HEART CATH AND CORONARY ANGIOGRAPHY N/A 09/01/2021   Procedure: RIGHT/LEFT HEART CATH AND CORONARY ANGIOGRAPHY;  Surgeon: Corky Crafts, MD;  Location: [facility] INVASIVE CV LAB;  Service: Cardiovascular;  Laterality: N/A;   ROTATOR CUFF REPAIR Right    TEE WITHOUT CARDIOVERSION N/A 09/01/2021  Procedure: TRANSESOPHAGEAL ECHOCARDIOGRAM (TEE);  Surgeon: Wendall Stade, MD;  Location: [facility] ENDOSCOPY;  Service: Cardiovascular;  Laterality: N/A;   TEE WITHOUT CARDIOVERSION N/A 10/04/2021   Procedure: TRANSESOPHAGEAL ECHOCARDIOGRAM (TEE);  Surgeon: Loreli Slot, MD;  Location: [facility] OR;  Service: Open Heart Surgery;  Laterality: N/A;    No outpatient medications have been marked as taking for the 12/26/22 encounter (Appointment) with [name], MD.    12 system ROS was negative unless otherwise  noted in HPI   Observations/Objective: Patient demonstrates good understanding of our conversation     Assessment and Plan: 77 year old male status post sclerotherapy of left ankle for bleeding reticular veins.  Ankle mostly healing as pictured above.  Okay for NSAIDs prn. Patient can follow-up on an as-needed basis.   I spent 5 minutes with the patient via telephone encounter and in chart review before and after our discussion.   Signed, Luanna Salk. Randie Heinz, MD [facility] [place] Office: [phone] Pager: [phone]   12/26/2022, 2:51 PM

## 2022-12-27 ENCOUNTER — Ambulatory Visit (HOSPITAL_COMMUNITY): Payer: Medicare Other | Attending: Cardiovascular Disease

## 2022-12-27 ENCOUNTER — Ambulatory Visit (HOSPITAL_BASED_OUTPATIENT_CLINIC_OR_DEPARTMENT_OTHER): Payer: Medicare Other

## 2022-12-27 DIAGNOSIS — R42 Dizziness and giddiness: Secondary | ICD-10-CM

## 2022-12-27 DIAGNOSIS — R0789 Other chest pain: Secondary | ICD-10-CM | POA: Diagnosis present

## 2022-12-27 DIAGNOSIS — I10 Essential (primary) hypertension: Secondary | ICD-10-CM | POA: Insufficient documentation

## 2022-12-27 DIAGNOSIS — R0602 Shortness of breath: Secondary | ICD-10-CM | POA: Insufficient documentation

## 2022-12-27 LAB — MYOCARDIAL PERFUSION IMAGING
Nuc Stress EF: 56 %
SDS: 2
SRS: 0
Stress Nuclear Isotope Dose: 32.8 mCi

## 2022-12-27 LAB — ECHOCARDIOGRAM COMPLETE
AR max vel: 2.06 cm2
AV Area VTI: 1.94 cm2
AV Area mean vel: 1.92 cm2
AV Mean grad: 10 mmHg
AV Peak grad: 17.6 mmHg
Ao pk vel: 2.1 m/s
Area-P 1/2: 3.81 cm2
Height: 68 in
MV M vel: 6.62 m/s
MV Peak grad: 175 mmHg
S' Lateral: 3.27 cm
Weight: 3344 oz

## 2022-12-27 MED ORDER — REGADENOSON 0.4 MG/5ML IV SOLN
0.4000 mg | Freq: Once | INTRAVENOUS | Status: AC
Start: 2022-12-27 — End: 2022-12-27
  Administered 2022-12-27: 0.4 mg via INTRAVENOUS

## 2022-12-27 MED ORDER — TECHNETIUM TC 99M TETROFOSMIN IV KIT
32.8000 | PACK | Freq: Once | INTRAVENOUS | Status: AC | PRN
  Administered 2022-12-27: 32.8 via INTRAVENOUS

## 2022-12-27 MED ORDER — TECHNETIUM TC 99M TETROFOSMIN IV KIT
10.3000 | PACK | Freq: Once | INTRAVENOUS | Status: AC | PRN
  Administered 2022-12-27: 10.3 via INTRAVENOUS

## 2022-12-28 LAB — MYOCARDIAL PERFUSION IMAGING
LV dias vol: 97 mL (ref 62–150)
LV sys vol: 43 mL
Peak HR: 69 {beats}/min
Rest HR: 56 {beats}/min
Rest Nuclear Isotope Dose: 10.3 mCi
SSS: 2
ST Depression (mm): 0 mm
TID: 0.82

## 2022-12-31 ENCOUNTER — Telehealth: Payer: Self-pay

## 2022-12-31 DIAGNOSIS — I10 Essential (primary) hypertension: Secondary | ICD-10-CM

## 2022-12-31 DIAGNOSIS — Z79899 Other long term (current) drug therapy: Secondary | ICD-10-CM

## 2022-12-31 DIAGNOSIS — R0602 Shortness of breath: Secondary | ICD-10-CM

## 2022-12-31 MED ORDER — FUROSEMIDE 20 MG PO TABS: 20.0000 mg | ORAL_TABLET | Freq: Every day | ORAL | 11 refills | Status: DC

## 2022-12-31 NOTE — Telephone Encounter (Signed)
-----   Message from Peter C Nishan, MD sent at 12/31/2022  8:02 AM EDT ----- AVR ok some diastolic dysfunction add lasix 20 mg daily check BMET/BNP in 2 weeks and f/u with PA 

## 2022-12-31 NOTE — Telephone Encounter (Signed)
The patient has been notified of the result and verbalized understanding.  All questions (if any) were answered. Cindi Carbon Cassadaga, RN 12/31/2022 4:23 PM   Patient has appointment already scheduled with Dr. Eden Emms. Patient will get lab work done after his visit.

## 2022-12-31 NOTE — Telephone Encounter (Signed)
-----   Message from Wendall Stade, MD sent at 12/31/2022  8:02 AM EDT ----- AVR ok some diastolic dysfunction add lasix 20 mg daily check BMET/BNP in 2 weeks and f/u with PA

## 2023-01-02 ENCOUNTER — Telehealth: Payer: Self-pay | Admitting: Neurology

## 2023-01-02 NOTE — Progress Notes (Signed)
Cardiology Office Note    Date:  01/09/2023   ID:  Adrian Franco, DOB 11-06-1945, MRN [id number]   PCP:  Alinda Deem, MD   [facility]  Cardiologist:  Charlton Haws, MD     History of Present Illness:  Adrian Franco is a 77 y.o. male with a History of Hypertension, Hyperlipidemia, DVT, PE,aortic insufficiency, ascending aneurysm, sleep apnea, arthritis, BPH, back pain and depression,   Patient developed severe AI and underwent CABG x1 and biologic bentall Aortic valve conduit 10/04/21. He had post op Afib converted to NSR with IV Amio. Also AKI and irbesartan stopped.  March 2023 had fatigue and malaise Amiodarone d/c and lopressor decreased Dizziness improved Has had issues with left knee with bloody effusion seeing Dr Madelon Lips   Seen by Dr Elease Hashimoto for dyspnea 11/27/22. He is a Marine scientist and farms Chronic edema LLE from prior DVT. 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 Edema better    Past Medical History:  Diagnosis Date   Aneurysm (HCC)    Anxiety    Aortic aneurysm (HCC)    followed by Dr Eden Emms   Arthritis    Atypical chest pain    BPH (benign prostatic hypertrophy)    Chronic back pain    Depression    DVT (deep venous thrombosis) (HCC)    GERD (gastroesophageal reflux disease)    History of kidney stones    HTN (hypertension)    Hyperlipemia    Hypogonadism in male    Insomnia    Localized edema    PE (pulmonary embolism)    Renal insufficiency    Restless leg syndrome    Sleep apnea    severe OSA-cannot tolerate CPAP    SOB (shortness of breath)    Testicular hypofunction     Past Surgical History:  Procedure Laterality Date   BENTALL PROCEDURE N/A 10/04/2021   Procedure: BENTALL PROCEDURE USING 25 MM KONNECT RESILIA  AORTIC VALVED CONDUIT;  Surgeon: Loreli Slot, MD;  Location: [facility] OR;  Service: Open Heart Surgery;  Laterality: N/A;   CORONARY ARTERY BYPASS GRAFT N/A 10/04/2021   Procedure: CORONARY ARTERY BYPASS GRAFTING (CABG) TIMES ONE USING RIGHT GREATER SAPHENOUS VEIN;  Surgeon: Loreli Slot, MD;  Location: [facility] OR;  Service: Open Heart Surgery;  Laterality: N/A;   DRUG INDUCED ENDOSCOPY N/A 12/09/2019   Procedure: DRUG INDUCED ENDOSCOPY;  Surgeon: Christia Reading, MD;  Location: [facility];  Service: ENT;  Laterality: N/A;   ENDOVEIN HARVEST OF GREATER SAPHENOUS VEIN Right 10/04/2021   Procedure: ENDOVEIN HARVEST OF GREATER SAPHENOUS VEIN;  Surgeon: Loreli Slot, MD;  Location: [facility] OR;  Service: Open Heart Surgery;  Laterality: Right;   IMPLANTATION OF HYPOGLOSSAL NERVE STIMULATOR Right 02/17/2020   Procedure: IMPLANTATION OF HYPOGLOSSAL NERVE STIMULATOR;  Surgeon: Christia Reading, MD;  Location: [facility] OR;  Service: ENT;  Laterality: Right;   INGUINAL HERNIA REPAIR     KIDNEY STONE SURGERY     stent   REVERSE SHOULDER ARTHROPLASTY  Left 03/08/2021   Procedure: REVERSE SHOULDER ARTHROPLASTY;  Surgeon: Bjorn Pippin, MD;  Location: [facility] ORS;  Service: Orthopedics;  Laterality: Left;   RIGHT/LEFT HEART CATH AND CORONARY ANGIOGRAPHY N/A 09/01/2021   Procedure: RIGHT/LEFT HEART CATH AND CORONARY ANGIOGRAPHY;  Surgeon: Corky Crafts, MD;  Location: [facility] INVASIVE CV LAB;  Service: Cardiovascular;  Laterality: N/A;   ROTATOR CUFF REPAIR Right    TEE WITHOUT CARDIOVERSION N/A 09/01/2021   Procedure: TRANSESOPHAGEAL ECHOCARDIOGRAM (TEE);  Surgeon: Wendall Stade, MD;  Location: [facility] ENDOSCOPY;  Service: Cardiovascular;  Laterality: N/A;   TEE WITHOUT CARDIOVERSION N/A 10/04/2021   Procedure:  TRANSESOPHAGEAL ECHOCARDIOGRAM (TEE);  Surgeon: Loreli Slot, MD;  Location: [facility] OR;  Service: Open Heart Surgery;  Laterality: N/A;    Current Medications: Current Meds  Medication Sig   acetaminophen (TYLENOL) 500 MG tablet Take 1,000 mg by mouth every 8 (eight) hours as needed for moderate pain.   amLODipine (NORVASC) 2.5 MG tablet Take 2.5 mg by mouth daily.   amoxicillin (AMOXIL) 500 MG capsule Take 4 capsules (2,000mg ) by mouth 30-60 minutes before dental exams/procedures   Ascorbic Acid (VITAMIN C) 1000 MG tablet Take 1,000 mg by mouth in the morning.   aspirin EC 81 MG EC tablet Take 1 tablet (81 mg total) by mouth daily. Swallow whole.   atorvastatin (LIPITOR) 40 MG tablet TAKE 1 TABLET EVERY DAY   Cholecalciferol (VITAMIN D) 50 MCG (2000 UT) tablet Take 2,000 Units by mouth daily.   Collagen-Vitamin C (SUPER COLLAGEN PLUS VITAMIN C PO) Take 1 tablet by mouth daily.   Cyanocobalamin (B-12) 5000 MCG CAPS Take 5,000 mcg by mouth daily.   docusate sodium (COLACE) 100 MG capsule Take 100 mg by mouth daily as needed for moderate constipation.   doxycycline (VIBRAMYCIN) 100 MG capsule Take 1 capsule (100 mg total) by mouth 2 (two) times daily.   furosemide (LASIX) 20 MG tablet Take 1 tablet (20 mg total) by mouth daily.   LORazepam (ATIVAN) 0.5 MG tablet Take 0.5 mg by mouth at bedtime.   LYRICA 75 MG capsule Take 75 mg by mouth daily.   magnesium oxide (MAG-OX) 400 MG tablet Take 400 mg by mouth daily.   Multiple Vitamins-Minerals (ONE-A-DAY MENS 50+ ADVANTAGE) TABS Take 1 tablet by mouth daily with breakfast.   nitroGLYCERIN (NITROSTAT) 0.4 MG SL tablet Place 1 tablet (0.4 mg total) under the tongue every 5 (five) minutes as needed for chest pain.   omeprazole (PRILOSEC) 40 MG capsule Take 40 mg by mouth daily.   oxymetazoline (AFRIN) 0.05 % nasal spray Place 1 spray into both nostrils at bedtime.   Polyvinyl Alcohol-Povidone (REFRESH OP) Place 1 drop into the left eye daily.  Refresh eye drops   rivaroxaban (XARELTO) 20 MG TABS tablet Take 1 tablet (20 mg total) by mouth daily with supper.   rOPINIRole (REQUIP) 4 MG tablet Take 4 mg by mouth at bedtime.   tamsulosin (FLOMAX) 0.4 MG CAPS capsule Take 0.4 mg by mouth 2 (two) times daily.   Testosterone Cypionate 200 MG/ML SOLN Inject 200 mg into the muscle every 14 (fourteen) days.    tiZANidine (ZANAFLEX) 2 MG tablet Take 2 mg by mouth every 6 (six) hours as needed for muscle spasms.     Allergies:   Sulfa antibiotics and Lotensin [benazepril hcl]   Social History   Socioeconomic History   Marital status: Married    Spouse name: [name]   Number of children: 2   Years of education:  Not on file   Highest education level: Associate degree: occupational, technical, or vocational program  Occupational History   Not on file  Tobacco Use   Smoking status: Never    Passive exposure: Never   Smokeless tobacco: Never  Vaping Use   Vaping Use: Never used  Substance and Sexual Activity   Alcohol use: No    Alcohol/week: 0.0 standard drinks of alcohol   Drug use: No   Sexual activity: Yes    Comment: married  Other Topics Concern   Not on file  Social History Narrative   Lives with wife   R handed   Caffeine: 2 C of coffee, diet sun drop a day   Social Determinants of Corporate investment banker Strain: Not on file  Food Insecurity: Not on file  Transportation Needs: Not on file  Physical Activity: Not on file  Stress: Not on file  Social Connections: Not on file     Family History:  The patient's  family history includes Healthy in his brother, sister, son, and son; Heart attack in his father; Hyperlipidemia in his father and mother; Other in his brother.   ROS:   Please see the history of present illness.    ROS All other systems reviewed and are negative.   PHYSICAL EXAM:   VS:  BP 122/86   Pulse 76   Ht 5\' 8"  (1.727 m)   Wt 207 lb (93.9 kg)   SpO2 94%   BMI 31.47 kg/m   Physical Exam    Affect appropriate Healthy:  appears stated age HEENT: normal Neck supple with no adenopathy JVP normal no bruits no thyromegaly Lungs clear with no wheezing and good diaphragmatic motion Heart:  S1/S2 SEM through AVR post sternotomy Abdomen: benighn, BS positve, no tenderness, no AAA no bruit.  No HSM or HJR LLE chronic phlebitis with plus 1 edema Neuro non-focal Skin warm and dry No muscular weakness   Wt Readings from Last 3 Encounters:  01/09/23 207 lb (93.9 kg)  12/27/22 209 lb (94.8 kg)  12/12/22 207 lb 14.4 oz (94.3 kg)      Studies/Labs Reviewed:   EKG: 11/27/22 SR rate 92 ICRBBB no acute changes   Recent Labs: 03/28/2022: ALT 9 11/27/2022: BUN 15; Creatinine, Ser 1.11; Hemoglobin 14.7; Platelets 199; Potassium 4.1; Sodium 143; TSH 1.310   Lipid Panel    Component Value Date/Time   CHOL 190 02/08/2022 1127   TRIG 261 (H) 02/08/2022 1127   HDL 55 02/08/2022 1127   CHOLHDL 3.5 02/08/2022 1127   LDLCALC 92 02/08/2022 1127    Additional studies/ records that were reviewed today include:     LLE Venous Duplex 10/31/22 TTE 12/27/22 Myovue 12/28/22     PLAN:  In order of problems listed above:  CABG/AVR:  10/04/21 [place] Biologic Bentall 25 mm Konect Resilia valve conduit with SVG to RCA. Recent TTE with normal valve function and Myovue with no ischemia SBE prophylaxis    HTN  .lopressor dose decreased march 2023 12.5 mg bid improved   HLD on Lipitor  History of DVT & PE on Xarelto post sclerotherapy Dr Randie Heinz chronic LLE phlebitis   Diastolic Dysfunction:  lasix added 12/31/22  BMET/BNP today  [facility] has inspire device    BMET/BNP  F/U in a year   Medication Adjustments/Labs and Tests Ordered: Current medicines are reviewed at length with the patient today.  Concerns regarding medicines are outlined above.  Medication changes, Labs and Tests ordered today are  listed in the Patient Instructions below. There are no Patient Instructions on file for this  visit.    Signed, Charlton Haws, MD  01/09/2023 2:16 PM    [facility] [address] [place], [place], [place]  [address] Phone: [phone]; Fax: [phone]

## 2023-01-02 NOTE — Telephone Encounter (Signed)
Pt wife called. Stated she needs an appointment for pt to get his Inspire increased. Stated she was told at pt last office visit that someone was going to be calling her, stated they never heard back.

## 2023-01-02 NOTE — Telephone Encounter (Signed)
Please see [facility] from 07/02/22, has there been any updates with inspire since then? Please advise pt

## 2023-01-09 ENCOUNTER — Ambulatory Visit: Payer: Medicare Other | Attending: Cardiovascular Disease | Admitting: Cardiovascular Disease

## 2023-01-09 ENCOUNTER — Ambulatory Visit: Payer: Medicare Other

## 2023-01-09 ENCOUNTER — Encounter: Payer: Self-pay | Admitting: Cardiovascular Disease

## 2023-01-09 VITALS — BP 122/86 | HR 76 | Ht 68.0 in | Wt 207.0 lb

## 2023-01-09 DIAGNOSIS — I350 Nonrheumatic aortic (valve) stenosis: Secondary | ICD-10-CM

## 2023-01-09 DIAGNOSIS — Z952 Presence of prosthetic heart valve: Secondary | ICD-10-CM

## 2023-01-09 DIAGNOSIS — I10 Essential (primary) hypertension: Secondary | ICD-10-CM

## 2023-01-09 DIAGNOSIS — I872 Venous insufficiency (chronic) (peripheral): Secondary | ICD-10-CM

## 2023-01-09 DIAGNOSIS — I25810 Atherosclerosis of coronary artery bypass graft(s) without angina pectoris: Secondary | ICD-10-CM | POA: Diagnosis not present

## 2023-01-09 DIAGNOSIS — R0602 Shortness of breath: Secondary | ICD-10-CM

## 2023-01-09 DIAGNOSIS — Z79899 Other long term (current) drug therapy: Secondary | ICD-10-CM

## 2023-01-09 NOTE — Patient Instructions (Addendum)
Medication Instructions:  Your physician recommends that you continue on your current medications as directed. Please refer to the Current Medication list given to you today.  *If you need a refill on your cardiac medications before your next appointment, please call your pharmacy*  Lab Work: TODAY: BMET and BNP If you have labs (blood work) drawn today and your tests are completely normal, you will receive your results only by: MyChart Message (if you have MyChart) OR A paper copy in the mail If you have any lab test that is abnormal or we need to change your treatment, we will call you to review the results.  Testing/Procedures: None ordered  Follow-Up: At [facility], you and your health needs are our priority.  As part of our continuing mission to provide you with exceptional heart care, we have created designated Provider Care Teams.  These Care Teams include your primary Cardiologist (physician) and Advanced Practice Providers (APPs -  Physician Assistants and Nurse Practitioners) who all work together to provide you with the care you need, when you need it.  Your next appointment:   6 months  The format for your next appointment:   In Person  Provider:   Charlton Haws, MD {

## 2023-01-10 LAB — BASIC METABOLIC PANEL
BUN/Creatinine Ratio: 12 (ref 10–24)
BUN: 14 mg/dL (ref 8–27)
CO2: 26 mmol/L (ref 20–29)
Calcium: 10.1 mg/dL (ref 8.6–10.2)
Chloride: 100 mmol/L (ref 96–106)
Creatinine, Ser: 1.18 mg/dL (ref 0.76–1.27)
Glucose: 82 mg/dL (ref 70–99)
Potassium: 4.6 mmol/L (ref 3.5–5.2)
Sodium: 140 mmol/L (ref 134–144)
eGFR: 64 mL/min/{1.73_m2} (ref >59)

## 2023-01-10 LAB — PRO B NATRIURETIC PEPTIDE: NT-Pro BNP: 102 pg/mL (ref 0–486)

## 2023-01-11 ENCOUNTER — Telehealth: Payer: Self-pay | Admitting: Cardiovascular Disease

## 2023-01-11 NOTE — Telephone Encounter (Signed)
Wife returned RN's call and stated she had spoken with RN Mindi Junker earlier.

## 2023-01-11 NOTE — Telephone Encounter (Signed)
Spoke to the pt wife, Adrian Franco listed on DPR she stated someone called her after she spoke with the nurse. Advised pt there was not a telephone encounter or note stating someone from our office called pt. Adrian Franco voiced understanding.

## 2023-01-11 NOTE — Telephone Encounter (Signed)
Pt's wife returning call to nurse from yesterday regarding lab results. Please advise

## 2023-01-11 NOTE — Telephone Encounter (Signed)
Spoke with pt's wife, [name], [place] and advised per Dr Rocky Morel labs normal.  Pt may continue Lasix dose.  Pt's wife verbalizes understanding and thanked Charity fundraiser for the call.

## 2023-01-14 ENCOUNTER — Telehealth: Payer: Self-pay | Admitting: Neurology

## 2023-01-14 ENCOUNTER — Ambulatory Visit (INDEPENDENT_AMBULATORY_CARE_PROVIDER_SITE_OTHER): Payer: Medicare Other | Admitting: Neurology

## 2023-01-14 ENCOUNTER — Encounter: Payer: Self-pay | Admitting: Neurology

## 2023-01-14 VITALS — Ht 69.0 in | Wt 206.0 lb

## 2023-01-14 DIAGNOSIS — Z9682 Presence of neurostimulator: Secondary | ICD-10-CM

## 2023-01-14 DIAGNOSIS — R0602 Shortness of breath: Secondary | ICD-10-CM | POA: Diagnosis not present

## 2023-01-14 DIAGNOSIS — Z954 Presence of other heart-valve replacement: Secondary | ICD-10-CM

## 2023-01-14 DIAGNOSIS — Z789 Other specified health status: Secondary | ICD-10-CM | POA: Diagnosis not present

## 2023-01-14 DIAGNOSIS — R0683 Snoring: Secondary | ICD-10-CM | POA: Diagnosis not present

## 2023-01-14 DIAGNOSIS — G4733 Obstructive sleep apnea (adult) (pediatric): Secondary | ICD-10-CM | POA: Diagnosis not present

## 2023-01-14 DIAGNOSIS — Z952 Presence of prosthetic heart valve: Secondary | ICD-10-CM

## 2023-01-14 NOTE — Patient Instructions (Addendum)
PLANS/RECOMMENDATIONS: the patient will be instructed to advance  the settings of the INSPIRE device gradually in 0.2V increments  over the coming weeks.  No more than one increase per week. We already increased to Voltage  today to 1 Volt from 0.8V here today, pause function 30 minutes, therapy delay 45 minutes.  Introduction to remote control.  Next step up will be on 01-21-2023 , increase to next Volt 1.2.   A new HST will be ordered for October 2024, allowing for up- titration prior to the test.   The patient should USE his Inspire device while being HST tested.     I plan to follow up either personally or through our NP within 6-7 months, following the HST .

## 2023-01-14 NOTE — Telephone Encounter (Signed)
Patient came into for his inspire check up with Dr. Vickey Huger.   Some changes were made please see below.

## 2023-01-14 NOTE — Progress Notes (Signed)
Provider:  Melvyn Novas, MD  Primary Care Physician:  Alinda Deem, MD [address] [place] [address]     Referring Provider: Alinda Deem, Md [address] [place],  [place] [address]          Chief Complaint according to patient   Patient presents with:     Follow up of Inspire patient / Patient (Initial Visit)     IMPRESSION:  06-26-2022 This HST confirms the presence of severe sleep apnea, OSA, while the patient is presumably using his INSPIRE device ( 0.9 v) - this speaks for poor apnea control.    RECOMMENDATION: In lab re-titration of the Inspire device is needed. - please confirm time of study with Irene Pap.       HISTORY OF PRESENT ILLNESS:  Adrian Franco is a 77 y.o. male patient who is here for revisit 01/14/2023 for follow up on his HST- the patient believes he switched the device off after a bathroom break- he also is a habitual mouth breather ,sleeps supine by habit.      The patient is a Marine scientist and was first referred to me in 09-16-2019 , at a time of high stress in his life.  by Dr Jenne Pane and PCP Loni Dolly, with the information that he had for over 10 years been on CPAP therapy, felt some benefit, but had no recent sleep study:  but he did not bring his CPAP machine to his appointment with [place]. It was difficult to convince him to get thorugh another sleep study .   We ordered an in lab sleep study-  His sleep study showed  severe apnea and prolonged  hypoxia, both best treated with PAP. Severe PLMs were noted.  10-06-2019: "Given the severe degree of Obstructive Sleep Apnea (OSA) with  an AHI of 36.7/h, the associated hypoxia ( 69 minutes of total  sleep time ) and supine exacerbation to AHI of 61/h, treatment of  sleep apnea is a must.  2. 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  I recommend  PAP titration study to optimize therapy.  If  the patient then does not tolerate CPAP /BiPAP,  a change to an alternative therapy is considered. Patient was interested in [place] and had already made an appointment with ENT for June 2021. Dr Jenne Pane will then follow up with the patient.  "  .    Medical history Bioprosthetic heart valve patient , CHF. He felt CPAP was too cumbersome and enjoys being an inspire patient instead , he started sleeping mainly supine.  Cardiac cath and TE-Echo were reviewed , these are from 09-2021.  HST was done in 05-2022.   Primarily Obstructive Sleep Apnea responded to Inspire at a  final voltage of 1.8V.  2. 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   A PSG has not been not performed since 2021.  07-12-2020  titration of implanted stimulator.        01-15-2023: Patient came into for his inspire check up with Dr. Vickey Huger.    Some changes were made please see below.                              Review of Systems: Out of a complete 14 system review, the patient complains of only the following  symptoms, and all other reviewed systems are negative.:  Some snoring, fragmented sleep Nocturia 3-4 times    How likely are you to doze in the following situations: 0 = not likely, 1 = slight chance, 2 = moderate chance, 3 = high chance   Sitting and Reading? Watching Television? Sitting inactive in a public place (theater or meeting)? As a passenger in a car for an hour without a break? Lying down in the afternoon when circumstances permit? Sitting and talking to someone? Sitting quietly after lunch without alcohol? In a car, while stopped for a few minutes in traffic?   Total = 5/ 24 points   FSS endorsed at 27/ 63 points.   GDS ; 2/ 15 POINTS.   Social History   Socioeconomic History   Marital status: Married    Spouse name: [name]   Number of children: 2   Years of education: Not on file   Highest education level: Associate degree: occupational,  Scientist, product/process development, or vocational program  Occupational History   Not on file  Tobacco Use   Smoking status: Never    Passive exposure: Never   Smokeless tobacco: Never  Vaping Use   Vaping Use: Never used  Substance and Sexual Activity   Alcohol use: No    Alcohol/week: 0.0 standard drinks of alcohol   Drug use: No   Sexual activity: Yes    Comment: married  Other Topics Concern   Not on file  Social History Narrative   Lives with wife   R handed   Caffeine: 2 C of coffee, diet sun drop a day   Social Determinants of Corporate investment banker Strain: Not on file  Food Insecurity: Not on file  Transportation Needs: Not on file  Physical Activity: Not on file  Stress: Not on file  Social Connections: Not on file    Family History  Problem Relation Age of Onset   Hyperlipidemia Mother    Hyperlipidemia Father    Heart attack Father    Healthy Son    Healthy Brother    Other Brother    Healthy Sister    Healthy Son     Past Medical History:  Diagnosis Date   Aneurysm (HCC)    Anxiety    Aortic aneurysm (HCC)    followed by Dr Eden Emms   Arthritis    Atypical chest pain    BPH (benign prostatic hypertrophy)    Chronic back pain    Depression    DVT (deep venous thrombosis) (HCC)    GERD (gastroesophageal reflux disease)    History of kidney stones    HTN (hypertension)    Hyperlipemia    Hypogonadism in male    Insomnia    Localized edema    PE (pulmonary embolism)    Renal insufficiency    Restless leg syndrome    Sleep apnea    severe OSA-cannot tolerate CPAP   SOB (shortness of breath)    Testicular hypofunction     Past Surgical History:  Procedure Laterality Date   BENTALL PROCEDURE N/A 10/04/2021   Procedure: BENTALL PROCEDURE USING 25 MM KONNECT RESILIA  AORTIC VALVED CONDUIT;  Surgeon: Loreli Slot, MD;  Location: [facility] OR;  Service: Open Heart Surgery;  Laterality: N/A;   CORONARY ARTERY BYPASS GRAFT N/A 10/04/2021   Procedure: CORONARY  ARTERY BYPASS GRAFTING (CABG) TIMES ONE USING RIGHT GREATER SAPHENOUS VEIN;  Surgeon: Loreli Slot, MD;  Location: [facility] OR;  Service: Open Heart Surgery;  Laterality: N/A;   DRUG INDUCED ENDOSCOPY N/A 12/09/2019   Procedure: DRUG INDUCED ENDOSCOPY;  Surgeon: Christia Reading, MD;  Location: [facility];  Service: ENT;  Laterality: N/A;   ENDOVEIN HARVEST OF GREATER SAPHENOUS VEIN Right 10/04/2021   Procedure: ENDOVEIN HARVEST OF GREATER SAPHENOUS VEIN;  Surgeon: Loreli Slot, MD;  Location: [facility] OR;  Service: Open Heart Surgery;  Laterality: Right;   IMPLANTATION OF HYPOGLOSSAL NERVE STIMULATOR Right 02/17/2020   Procedure: IMPLANTATION OF HYPOGLOSSAL NERVE STIMULATOR;  Surgeon: Christia Reading, MD;  Location: [facility] OR;  Service: ENT;  Laterality: Right;   INGUINAL HERNIA REPAIR     KIDNEY STONE SURGERY     stent   REVERSE SHOULDER ARTHROPLASTY Left 03/08/2021   Procedure: REVERSE SHOULDER ARTHROPLASTY;  Surgeon: Bjorn Pippin, MD;  Location: [facility] ORS;  Service: Orthopedics;  Laterality: Left;   RIGHT/LEFT HEART CATH AND CORONARY ANGIOGRAPHY N/A 09/01/2021   Procedure: RIGHT/LEFT HEART CATH AND CORONARY ANGIOGRAPHY;  Surgeon: Corky Crafts, MD;  Location: [facility] INVASIVE CV LAB;  Service: Cardiovascular;  Laterality: N/A;   ROTATOR CUFF REPAIR Right    TEE WITHOUT CARDIOVERSION N/A 09/01/2021   Procedure: TRANSESOPHAGEAL ECHOCARDIOGRAM (TEE);  Surgeon: Wendall Stade, MD;  Location: [facility] ENDOSCOPY;  Service: Cardiovascular;  Laterality: N/A;   TEE WITHOUT CARDIOVERSION N/A 10/04/2021   Procedure: TRANSESOPHAGEAL ECHOCARDIOGRAM (TEE);  Surgeon: Loreli Slot, MD;  Location: [facility] OR;  Service: Open Heart Surgery;  Laterality: N/A;     Current Outpatient Medications on File Prior to Visit  Medication Sig Dispense Refill   acetaminophen (TYLENOL) 500 MG tablet Take 1,000 mg by mouth every 8 (eight) hours as needed for moderate pain.     amLODipine (NORVASC) 2.5 MG tablet Take 2.5  mg by mouth daily.     amoxicillin (AMOXIL) 500 MG capsule Take 4 capsules (2,000mg ) by mouth 30-60 minutes before dental exams/procedures 4 capsule 6   Ascorbic Acid (VITAMIN C) 1000 MG tablet Take 1,000 mg by mouth in the morning.     aspirin EC 81 MG EC tablet Take 1 tablet (81 mg total) by mouth daily. Swallow whole. 30 tablet 11   atorvastatin (LIPITOR) 40 MG tablet TAKE 1 TABLET EVERY DAY 90 tablet 3   Cholecalciferol (VITAMIN D) 50 MCG (2000 UT) tablet Take 2,000 Units by mouth daily.     Collagen-Vitamin C (SUPER COLLAGEN PLUS VITAMIN C PO) Take 1 tablet by mouth daily.     Cyanocobalamin (B-12) 5000 MCG CAPS Take 5,000 mcg by mouth daily.     docusate sodium (COLACE) 100 MG capsule Take 100 mg by mouth daily as needed for moderate constipation.     doxycycline (VIBRAMYCIN) 100 MG capsule Take 1 capsule (100 mg total) by mouth 2 (two) times daily. 14 capsule 0   furosemide (LASIX) 20 MG tablet Take 1 tablet (20 mg total) by mouth daily. 30 tablet 11   LORazepam (ATIVAN) 0.5 MG tablet Take 0.5 mg by mouth at bedtime.     LYRICA 75 MG capsule Take 75 mg by mouth daily.     magnesium oxide (MAG-OX) 400 MG tablet Take 400 mg by mouth daily.     Multiple Vitamins-Minerals (ONE-A-DAY MENS 50+ ADVANTAGE) TABS Take 1 tablet by mouth daily with breakfast.     nitroGLYCERIN (NITROSTAT) 0.4 MG SL tablet Place 1 tablet (0.4 mg total) under the tongue every 5 (five) minutes as needed for chest pain. 25 tablet 3   omeprazole (PRILOSEC) 40 MG capsule Take 40  mg by mouth daily.     oxymetazoline (AFRIN) 0.05 % nasal spray Place 1 spray into both nostrils at bedtime.     Polyvinyl Alcohol-Povidone (REFRESH OP) Place 1 drop into the left eye daily. Refresh eye drops     rivaroxaban (XARELTO) 20 MG TABS tablet Take 1 tablet (20 mg total) by mouth daily with supper. 90 tablet 3   rOPINIRole (REQUIP) 4 MG tablet Take 4 mg by mouth at bedtime.     tamsulosin (FLOMAX) 0.4 MG CAPS capsule Take 0.4 mg by mouth 2  (two) times daily.     Testosterone Cypionate 200 MG/ML SOLN Inject 200 mg into the muscle every 14 (fourteen) days.      tiZANidine (ZANAFLEX) 2 MG tablet Take 2 mg by mouth every 6 (six) hours as needed for muscle spasms.     No current facility-administered medications on file prior to visit.    Allergies  Allergen Reactions   Sulfa Antibiotics Itching   Lotensin [Benazepril Hcl] Hives and Rash     DIAGNOSTIC DATA (LABS, IMAGING, TESTING) - I reviewed patient records, labs, notes, testing and imaging myself where available.  Lab Results  Component Value Date   WBC 6.2 11/27/2022   HGB 14.7 11/27/2022   HCT 44.6 11/27/2022   MCV 89 11/27/2022   PLT 199 11/27/2022      Component Value Date/Time   NA 140 01/09/2023 1444   K 4.6 01/09/2023 1444   CL 100 01/09/2023 1444   CO2 26 01/09/2023 1444   GLUCOSE 82 01/09/2023 1444   GLUCOSE 118 (H) 10/10/2021 0220   BUN 14 01/09/2023 1444   CREATININE 1.18 01/09/2023 1444   CALCIUM 10.1 01/09/2023 1444   PROT 6.3 03/28/2022 1302   ALBUMIN 4.1 03/28/2022 1302   AST 14 03/28/2022 1302   ALT 9 03/28/2022 1302   ALKPHOS 26 (L) 03/28/2022 1302   BILITOT 0.4 03/28/2022 1302   GFRNONAA >60 10/10/2021 0220   GFRAA 53 (L) 10/20/2020 1156   Lab Results  Component Value Date   CHOL 190 02/08/2022   HDL 55 02/08/2022   LDLCALC 92 02/08/2022   TRIG 261 (H) 02/08/2022   CHOLHDL 3.5 02/08/2022   Lab Results  Component Value Date   HGBA1C 5.4 10/02/2021   No results found for: "VITAMINB12" Lab Results  Component Value Date   TSH 1.310 11/27/2022    PHYSICAL EXAM:  Today's Vitals   01/14/23 1129  Weight: 206 lb (93.4 kg)  Height: 5\' 9"  (1.753 m)   Body mass index is 30.42 kg/m.   Wt Readings from Last 3 Encounters:  01/14/23 206 lb (93.4 kg)  01/09/23 207 lb (93.9 kg)  12/27/22 209 lb (94.8 kg)     Ht Readings from Last 3 Encounters:  01/14/23 5\' 9"  (1.753 m)  01/09/23 5\' 8"  (1.727 m)  12/27/22 5\' 8"  (1.727 m)      01-14-2023 General: The patient is awake, alert and appears not in acute distress. The patient is well groomed. Head: Cardiovascular:  Regular rate and cardiac rhythm by pulse,  without distended neck veins. Respiratory: Lungs are clear to auscultation.  Trunk: The patient's posture is erect.   NEUROLOGIC EXAM: The patient is awake and alert, oriented to place and time.   Memory subjective described as intact.  Attention span & concentration ability appears normal.  Speech is fluent,  without  dysarthria, dysphonia or aphasia.  Mood and affect are appropriate.   Cranial nerves: no loss of smell or  taste reported  Hearing was impaired to  finger rubbing.    Facial sensation intact to fine touch.  Facial motor strength is symmetric and tongue and uvula move midline.  Neck ROM : rotation, tilt and flexion extension were normal for age and shoulder shrug was symmetrical.    Motor exam:  Symmetric bulk, tone and ROM.   Normal tone without cog wheeling, symmetric grip strength .      Wave forms were optained and will be copied into the report.   ASSESSMENT AND PLAN 77 y.o. year old male  here with: Inspire  at 0.9 V - he was supposed to work up to 1.8 Volts.  He also makes breathing -noises at night  but those are different from the time before inspire. Not truly snoring, more puffing, more gasping.   His HST documented a high residual AHI, but the patient believes that he had switched the device off that night after a bathroom break. (?) .   He reports dry mouth- may benefit from Biotene mouth wash and humidifier in the bedroom.  He has also gained weight to a bmi of 30.5 from 28.8 at the time of titration. The patient has low oxygen levels during his baseline PSG and may still have hypoxia now on inspire.     PLANS/RECOMMENDATIONS: the patient will be instructed to advance  the settings of the INSPIRE device gradually in 0.2V increments  over the coming weeks.  We already  increased to Voltage  today to 1 Volt from 0.8V here today, pause function 30 minutes, therapy delay 45 minutes.  Introduction to remote control.   A new HST will be ordered for October 2024, allowing for up- titration prior to the test.     I plan to follow up either personally or through our NP within 6 months, following the HST .  After spending a total time of  35  minutes face to face and additional time for physical and neurologic examination, review of laboratory studies,  personal review of imaging studies, reports and results of other testing and review of referral information / records as far as provided in visit,   Electronically signed by:  Melvyn Novas, MD 01/14/2023 11:42 AM  [facility] Board certified by The [employer] of Sleep Medicine and Diplomate of the [employer] of Sleep Medicine. Board certified In Neurology through the ABPN, Fellow of the [employer] of Neurology. [facility] is an Geneticist, molecular.

## 2023-01-18 ENCOUNTER — Ambulatory Visit: Payer: Medicare Other | Attending: Genetic Counselor | Admitting: Genetic Counselor

## 2023-01-27 NOTE — Progress Notes (Signed)
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    Personal Medical Information Adrian Franco (III.1 on pedigree), a 77 year old Caucasian gentleman, is here today with his wife, Adrian Franco. He tells me that he was found to have a thoracic aortic aneurysm incidentally while being evaluated for a pulmonary embolism in 2017 at age 55. He tells me that he was not informed of this until two years later by Adrian Franco who recommended that this should be monitored regularly. He reports having annual echocardiograms in December. In 2022, after his echocardiogram, he received a call informing him of having severe aortic valve regurgitations and underwent several procedure in Feb 2023 that included repair with bovine valve, aneurysm repair and 1 vessel CABG. He reports having no major functional limitations since then.    Traditional Risk Factors Adrian Franco denies having a history of prior aortic dissection. Reports having HTN and is treated with medication. Also report shaving risk factor of atherosclerosis and has been on statins for several years.  Family history Relation to Proband Pedigree # Current age Heart condition/age of onset Notes  Son-1 IV.1 48 TAA of 4.2 cm @ 47   Son-2 IV.2 40 TAA and AAA @ 39   Granddaughters, 2 V.1-V.2 21, 19 None   Sister, brother- fraternal twins III.2, III.3 61 Brother- TAA of 4.2 @ 60 Sister- normal CT-A @ 60  Nephew, niece IV.3-IV.4 33, 29 None         Father II.9 Deceased TAA @ 41- repaired AAA @ 45- declined surgery to repair Had cardiac arrest  @ 80s at home while sitting in a chair. Taken to hospital where he was pronounced dead.  Paternal Aunts, 6 II.3-II.8 II.3-93 Others-deceased None All sisters died of natural causes in their 59s.  Paternal Uncles, 2 II.1, II.2 Deceased None II.1- Died of natural causes @ 70s II.2-  died in sleep @ 39s, autopsy not done  Paternal grandfather 1.1 Deceased unaware Died of natural causes @ 11s  Paternal grandmother I.2 Deceased unaware Died of natural causes @ 63s        Mother II.10 Deceased 2 x CABG @ 72s Died @ 16- natural causes  Maternal aunt II.11 Deceased unaware Died of natural causes @ 73s  Maternal uncle II.12 Deceased unaware Died @ 62 of heart issues?  Maternal grandfather I.3 Deceased unaware Died of natural causes @ 9s  Maternal grandmother I.4 Deceased unaware Died of natural causes @ 2s   Pre-Test Genetic Consultation Notes  Informed Adrian Franco that congenital conditions that include a bicuspid aortic valve and conotruncal defects account for nearly 70% of aortic dilatations (AoD), idiopathic and genetic disorders account for 11% and 17% of AoD, respectively. They were counseled on the genetics of syndromes that present with aortic aneurysms and dissections, specifically Marfan syndrome (MFS), Loeys-Dietz syndrome (LDS), Vascular Ehlers-Danlos Syndrome (vEDS) and other non-syndromic familial forms of thoracic aortic aneurysms and dissection (FTAAD). I explained to him that aortopathies are an autosomal dominant condition. If he has a genetic aortopathy then his children have a 50% chance of inheriting this condition. Also informed him of the high rate of de novo mutations in the syndromic aortopathy conditions. Patient verbalized understanding.   I discussed incomplete penetrance associated with this condition i.e. not all individuals harboring a mutation will present clinically, and age-related penetrance where clinical presentation increases with advanced age. I also  reviewed variable expression and emphasized that this condition can express at any age at any level of severity in the family. Hence, it is important for his first-degree relatives to undergo CTA screening for aortic aneurysms.   We walked through the process of genetic testing.   I explained to him  that genetic testing is a probabilistic test dependent upon his age and severity of presentation, presence of risk factors and importantly family history of aortic aneurysms or sudden death in first-degree relatives.  The potential outcomes of genetic testing and subsequent management of at-risk family members were discussed so as to manage expectations-  I explained to him that if a mutation is not identified, then all first-degree relatives should undergo cardiac screening for aortic aneurysms. While nearly 11% of AoD cases are idiopathic, a negative test result can be due to limitations of the genetic test.   There is also the likelihood of identifying a "Variant of unknown significance." This result means that the variant has not been detected in a statistically significant number of patients and/or functional studies have not been performed to verify its pathogenicity. This VUS can be tested in the family to see if it segregates with disease. If a VUS is found, first-degree relatives should get screened.  If a pathogenic variant is reported, then his first-degree family members can get tested for this variant. If they test positive, it is likely they will develop an aortopathy. In light of variable expression and incomplete penetrance associated with this condition, it is not possible to predict when they will manifest clinically with an aneurysm. It is recommended that family members that test positive for the familial pathogenic variant pursue clinical screening by MRI or CT with reevaluation every 2-5 years.  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 In addition, he also reports significant family history of thoracic and abdominal aortic aneurysm in his brother and father. Considering his family history, it is likely that he inherited this condition from his father.     Genetic testing for the genes implicated in  aortopathies is recommended. This test should include the major genes that contribute to LDS, vEDS and FTAAD, namely TGBR1, TGBR2, ACTA2, SMAD3 and COL3A1. The genetic test will help confirm his diagnosis and identify the genetic basis of his disease. In addition, it will help define time of intervention for his children who have been found to have aortic aneurysms as guidelines for threshold for surgical intervention in patients with Cathleen Fears syndrome is lower (>4.25mm on TEE) than those with MFS (>4.5 -5.0 mm, based on risk factors).  Since this is an autosomal dominant condition, a positive test result will help identify first-degree family members that may harbor the mutation and are at risk of developing this condition. Appropriate cardiology follow-up and lifestyle management can then be directed to those genotype-positive family members.   In addition, we discussed the protections afforded by the Genetic Information Non-Discrimination Act (GINA). I explained to him that GINA protects him from losing employment or health insurance based on his genotype. However, these protections do not cover life insurance and disability. He verbalized understanding of this and states that his kids have life insurance.  Please note that the patient has not been counseled in this visit on other personal, cultural. or ethical issues that he may face due to his heart condition.   Plan After a thorough discussion of the risk and benefits  of genetic testing for HCM, Adrian Franco declines genetic testing for aortopathies due to insurance non-coverage. He will inform his kids about pursuing genetic testing as most private insurance companies have medical policies that recommend coverage for genetic testing with appropriate clinical indications.   Sidney Ace, Ph.D, [facility] Clinical Molecular Geneticist

## 2023-02-11 ENCOUNTER — Encounter: Payer: Self-pay | Admitting: Cardiovascular Disease

## 2023-02-12 MED ORDER — AMOXICILLIN 500 MG PO CAPS: ORAL_CAPSULE | ORAL | 6 refills | Status: DC

## 2023-02-19 ENCOUNTER — Ambulatory Visit (INDEPENDENT_AMBULATORY_CARE_PROVIDER_SITE_OTHER): Payer: Medicare Other | Admitting: Neurology

## 2023-02-19 DIAGNOSIS — Z954 Presence of other heart-valve replacement: Secondary | ICD-10-CM

## 2023-02-19 DIAGNOSIS — R0683 Snoring: Secondary | ICD-10-CM

## 2023-02-19 DIAGNOSIS — G4733 Obstructive sleep apnea (adult) (pediatric): Secondary | ICD-10-CM

## 2023-02-19 DIAGNOSIS — Z9682 Presence of neurostimulator: Secondary | ICD-10-CM

## 2023-02-19 DIAGNOSIS — Z952 Presence of prosthetic heart valve: Secondary | ICD-10-CM

## 2023-02-19 DIAGNOSIS — Z789 Other specified health status: Secondary | ICD-10-CM

## 2023-02-20 ENCOUNTER — Telehealth: Payer: Self-pay | Admitting: Neurology

## 2023-02-20 NOTE — Progress Notes (Signed)
[facility] Adrian Franco "Jimmie" 77 year old male 04-Oct-1945   HOME SLEEP TEST REPORT ( by Watch PAT)   STUDY DATE:  02-20-2023    ORDERING CLINICIAN: Melvyn Novas, MD  REFERRING CLINICIANGaetano Hawthorne    CLINICAL INFORMATION/HISTORY: Follow up of Inspire patient / Patient with HTN, a fib on amiodarone. 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  10-06-2019: "Given the severe degree of Obstructive Sleep Apnea (OSA) with  an AHI of 36.7/h, the associated hypoxia ( 69 minutes of total  sleep time ) and supine exacerbation to AHI of 61/h, treatment of  sleep apnea is a must.  2. 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  I recommend  PAP titration study to optimize therapy.  If the patient then does not tolerate CPAP /BiPAP,  a change to an alternative therapy is considered. Patient was interested in [place] and had already made an appointment with ENT for June 2021. Dr Jenne Pane will then follow up with the patient.  "  .         IMPRESSION:  HST 06-26-2022 This HST confirms the presence of severe sleep apnea, OSA, while the patient was presumably using his INSPIRE device ( 0.9 v) -  poor apnea control.  Adrian Franco is a 77 y.o. male patient who is here for revisit 01/14/2023 for follow up on his HST- the patient believes he switched the device off after a bathroom break- he also is a habitual mouth breather ,sleeps supine by habit.         Epworth sleepiness score: 5/24.   BMI: 31.5 kg/m   Neck Circumference: 17   FINDINGS:    Total Recording Time (hours, min):   8 hours 10 minutes  Total Sleep Time (hours, min):      7 hours 1 minute           Percent REM (%):     25.7%                                   Respiratory Indices by AASM criteria: CMS directed values in ()   Calculated pAHI (per hour):     33.5/h   (17.8/h)                      REM pAHI:   41.4/h    (22/h)                                          NREM pAHI: 31.1/h    (16.5/h)                        Supine AHI: By CMS 17.9/h.  There was no nonsupine sleep. The snoring level reached a mean volume of 41 dB which is mild and was present for 35% of total sleep time.                                                 Oxygen Saturation Statistics:  O2 Saturation Range (%):    Between a minimum at 85% and a maximum of 98% with a mean saturation of 93%                                   O2 Saturation (minutes) <89%:   1.1 minutes        Pulse Rate Statistics:   Pulse Mean (bpm): 66 bpm                Pulse Range:       51 through 103 bpm.  Please note that there is no cardiac rhythm indicated here.          IMPRESSION:  This HST confirms the presence of moderate sleep apnea, presumed obstructive.  There is a slight REM sleep predominance, all sleep was in supine, no significant hypoxia.  As this sleep study was performed with an inspire device used overnight it does not show resolution of apnea, nor  a significant decrease in AHI by comparison to the baseline study from 2021.  Please note that the 2021 study was interpreted under AASM criteria.  What has improved is hypoxia.   RECOMMENDATION: If the patient cannot tolerate higher settings and that may be beneficial overall to reduce his AHI further, otherwise I see no other way to further improve his AHI through the use of  an hypoglossal nerve stimulator.     INTERPRETING PHYSICIAN:   Melvyn Novas, MD

## 2023-02-20 NOTE — Telephone Encounter (Signed)
Sent patient mychart message to make sure he used his inspire device during the home sleep study.

## 2023-03-02 NOTE — Procedures (Signed)
[facility] Adrian Franco "Jimmie" 77 year old male [birth year]   HOME SLEEP TEST REPORT ( by Watch PAT)   STUDY DATE:  02-20-2023     ORDERING CLINICIAN: Melvyn Novas, MD  REFERRING CLINICIANGaetano Hawthorne    CLINICAL INFORMATION/HISTORY: Follow up of Inspire patient / Patient with HTN, a fib on amiodarone. 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  10-06-2019:"Given the severe degree of Obstructive Sleep Apnea (OSA) with  an AHI of 36.7/h, the associated hypoxia ( 69 minutes of total  sleep time ) and supine exacerbation to AHI of 61/h, treatment of  sleep apnea is a must.  2. 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  I recommend  PAP titration study to optimize therapy.  If the patient then does not tolerate CPAP /BiPAP,  a change to an alternative therapy is considered. Patient was interested in [place] and had already made an appointment with ENT for June 2021. Dr Jenne Pane will then follow up with the patient.  "  Since then , the patient chose an implantation, was followed up here. Last testing by HST.  HST 06-26-2022 This HST confirms the presence of severe sleep apnea, OSA, while the patient was presumably using his INSPIRE device ( 0.9 v) -  poor apnea control.  Adrian Franco is a 77 y.o. male patient who is here for revisit 01/14/2023 for follow up on his HST- the patient believes he switched the device off after a bathroom break- he also is a habitual mouth breather ,sleeps supine by habit.      Epworth sleepiness score: 5/24   BMI: 31.5 kg/m   Neck Circumference: 17   FINDINGS:    Total Recording Time (hours, min):   8 hours 10 minutes  Total Sleep Time (hours, min):      7 hours 1 minute           Percent REM (%):     25.7%                                   Respiratory Indices by AASM criteria: CMS directed values in (...)   Calculated  pAHI (per hour):     33.5/h   (17.8/h)                     REM pAHI:   41.4/h    (22/h)                                          NREM pAHI: 31.1/h    (16.5/h)                        Supine AHI: By CMS 17.9/h.  There was no nonsupine sleep. The snoring level reached a mean volume of 41 dB which is mild and was present for 35% of total sleep time.                                                 Oxygen Saturation Statistics:  O2 Saturation Range (%):    Between a minimum at 85% and a maximum of 98% with a mean saturation of 93%                                   O2 Saturation (minutes) <89%:   1.1 minutes        Pulse Rate Statistics:   Pulse Mean (bpm): 66 bpm                Pulse Range:       51 through 103 bpm.  Please note that there is no cardiac rhythm indicated here.          IMPRESSION:  This HST confirms the presence of moderate sleep apnea, presumed obstructive.  There is a slight REM sleep predominance, all sleep was in supine, no significant hypoxia.   As this sleep study was performed with an inspire device used overnight it does not show resolution of apnea, nor  a significant decrease in AHI by comparison to the baseline study from 2021.  Please note that the 2021 study was interpreted under AASM criteria.   What has improved is hypoxia.   RECOMMENDATION: If the patient cannot tolerate higher settings and that may be beneficial overall to reduce his AHI further, otherwise I see no other way to further improve his AHI through the use of  an hypoglossal nerve stimulator.       INTERPRETING PHYSICIAN:    Melvyn Novas, MD

## 2023-03-06 ENCOUNTER — Encounter: Payer: Self-pay | Admitting: Neurology

## 2023-03-12 NOTE — Telephone Encounter (Signed)
Followed up to document notes about this patient coming in for Inspire follow up appt with Dr. Vickey Huger.  Went to check appt desk and unfortunately there was no appointment made, as stated by [facility].  I have called the patient this afternoon and have scheduled him to see Dr. Vickey Huger on 8/20 at 11:30. Confirmed with Earnest Bailey Rep that he will be present for this appt too.

## 2023-03-28 ENCOUNTER — Encounter: Payer: Self-pay | Admitting: Cardiovascular Disease

## 2023-04-16 ENCOUNTER — Telehealth: Payer: Self-pay | Admitting: Neurology

## 2023-04-16 ENCOUNTER — Encounter: Payer: Self-pay | Admitting: Neurology

## 2023-04-16 ENCOUNTER — Ambulatory Visit (INDEPENDENT_AMBULATORY_CARE_PROVIDER_SITE_OTHER): Payer: Medicare Other | Admitting: Neurology

## 2023-04-16 VITALS — Ht 68.0 in | Wt 205.0 lb

## 2023-04-16 DIAGNOSIS — I2782 Chronic pulmonary embolism: Secondary | ICD-10-CM

## 2023-04-16 DIAGNOSIS — Z789 Other specified health status: Secondary | ICD-10-CM

## 2023-04-16 DIAGNOSIS — Z9682 Presence of neurostimulator: Secondary | ICD-10-CM | POA: Diagnosis not present

## 2023-04-16 DIAGNOSIS — I2602 Saddle embolus of pulmonary artery with acute cor pulmonale: Secondary | ICD-10-CM

## 2023-04-16 NOTE — Patient Instructions (Signed)
Wave forms were optained and will be copied into the report.  He was started on 0.9 V in may 2024 and now is on 1.3V with better refreshing quality of sleep.    ASSESSMENT AND PLAN 77 y.o. year old male  here with:    1)  INSPIRE , and happy at 1.3V no matter h what the residual AHI may be.     I plan to follow up either personally or through our NP within 12 months.   I would like to thank Alinda Deem, MD for allowing me to meet with and to take care of this pleasant patient.   CC: I will share my notes with Dr.Peter Nishan .

## 2023-04-16 NOTE — Progress Notes (Addendum)
Provider:  Melvyn Novas, MD  Primary Care Physician:  Alinda Deem, MD [address] [place] [address]     Referring Provider: Alinda Deem, Md [address] [place],  [place] [address]          Chief Complaint according to patient   Patient presents with:     New Patient (Initial Visit)              Follow up of Inspire patient / Patient (Initial Visit)       IMPRESSION:  06-26-2022 This HST confirms the presence of severe sleep apnea, OSA, while the patient is presumably using his INSPIRE device ( 0.9 v) - this speaks for poor apnea control.    RECOMMENDATION: In lab re-titration of the Inspire device is needed. - please confirm time of study with Irene Pap.       HISTORY OF PRESENT ILLNESS:  Adrian Franco is a 77 y.o. male patient who is here for revisit 04/16/2023 for  INSPIRE follow up- .  Chief concern according to patient :  I no longer snore but my HST showed still apnea.  Currently having more nocturia as he started on lasix, HCTZ.  I took my HST and slept supine , CMS AHI was 18/h.  I did not lo ike any higher setting, best is 1.3 V and I was at 1.2 V during my HST.   Follow up of Inspire patient / Patient with HTN, a fib on amiodarone. 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  10-06-2019: "Given the severe degree of Obstructive Sleep Apnea (OSA) with  an AHI of 36.7/h, the associated hypoxia ( 69 minutes of total  sleep time ) and supine exacerbation to AHI of 61/h, treatment of  sleep apnea is a must. He also has PLMs and is considered to have RLS.        Adrian Franco is a 77 y.o. male patient who is here for revisit 01/14/2023 for follow up on his HST- the patient believes he switched the device off after a bathroom break- he also is a habitual mouth breather ,sleeps supine by habit.     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  by Dr Jenne Pane and PCP Loni Dolly, with the information that he had for over 10 years been on CPAP therapy, felt some benefit, but had no recent sleep study:  but he did not bring his CPAP machine to his appointment with [place]. It was difficult to convince him to get thorugh another sleep study .   We ordered an in lab sleep study-  His sleep study showed  severe apnea and prolonged  hypoxia, both best treated with PAP. Severe PLMs were noted.  10-06-2019: "Given the severe degree of Obstructive Sleep Apnea (OSA) with  an AHI of 36.7/h, the associated hypoxia ( 69 minutes of total  sleep time ) and supine exacerbation to AHI of 61/h, treatment of  sleep apnea is a must.  2. 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  I recommend  PAP titration study to optimize therapy.  If the patient then does not tolerate CPAP /BiPAP,  a change to an alternative therapy is considered. Patient was interested in  [facility] and had already made an appointment with ENT for June 2021. Dr Jenne Pane will then follow up with the patient.  "    Review of Systems: Out of a complete 14 system review, the patient complains of only the following symptoms, and all other reviewed systems are negative.:  Fatigue, sleepiness , snoring, fragmented sleep, Insomnia, RLS, Nocturia 2-4   How likely are you to doze in the following situations: 0 = not likely, 1 = slight chance, 2 = moderate chance, 3 = high chance   Sitting and Reading? Watching Television? Sitting inactive in a public place (theater or meeting)? As a passenger in a car for an hour without a break? Lying down in the afternoon when circumstances permit? Sitting and talking to someone? Sitting quietly after lunch without alcohol? In a car, while stopped for a few minutes in traffic?   Total = 6/ 24 points   FSS endorsed at 24/ 63 points.   Social History    Socioeconomic History   Marital status: Married    Spouse name: [name]   Number of children: 2   Years of education: Not on file   Highest education level: Associate degree: occupational, Scientist, product/process development, or vocational program  Occupational History   Not on file  Tobacco Use   Smoking status: Never    Passive exposure: Never   Smokeless tobacco: Never  Vaping Use   Vaping status: Never Used  Substance and Sexual Activity   Alcohol use: No    Alcohol/week: 0.0 standard drinks of alcohol   Drug use: No   Sexual activity: Yes    Comment: married  Other Topics Concern   Not on file  Social History Narrative   Lives with wife   R handed   Caffeine: 2 C of coffee, diet sun drop a day   Social Determinants of Corporate investment banker Strain: Not on file  Food Insecurity: Not on file  Transportation Needs: Not on file  Physical Activity: Not on file  Stress: Not on file  Social Connections: Not on file    Family History  Problem Relation Age of Onset   Hyperlipidemia Mother    Hyperlipidemia Father    Heart attack Father    Healthy Son    Healthy Brother    Other Brother    Healthy Sister    Healthy Son     Past Medical History:  Diagnosis Date   Aneurysm (HCC)    Anxiety    Aortic aneurysm (HCC)    followed by Dr Eden Emms   Arthritis    Atypical chest pain    BPH (benign prostatic hypertrophy)    Chronic back pain    Depression    DVT (deep venous thrombosis) (HCC)    GERD (gastroesophageal reflux disease)    History of kidney stones    HTN (hypertension)    Hyperlipemia    Hypogonadism in male    Insomnia    Localized edema    PE (pulmonary embolism)    Renal insufficiency    Restless leg syndrome    Sleep apnea    severe OSA-cannot tolerate CPAP   SOB (shortness of breath)    Testicular hypofunction     Past Surgical History:  Procedure Laterality Date   BENTALL PROCEDURE N/A 10/04/2021   Procedure: BENTALL PROCEDURE USING 25 MM KONNECT RESILIA   AORTIC VALVED CONDUIT;  Surgeon: Loreli Slot, MD;  Location: [facility] OR;  Service: Open Heart Surgery;  Laterality: N/A;  CORONARY ARTERY BYPASS GRAFT N/A 10/04/2021   Procedure: CORONARY ARTERY BYPASS GRAFTING (CABG) TIMES ONE USING RIGHT GREATER SAPHENOUS VEIN;  Surgeon: Loreli Slot, MD;  Location: [facility] OR;  Service: Open Heart Surgery;  Laterality: N/A;   DRUG INDUCED ENDOSCOPY N/A 12/09/2019   Procedure: DRUG INDUCED ENDOSCOPY;  Surgeon: Christia Reading, MD;  Location: [facility];  Service: ENT;  Laterality: N/A;   ENDOVEIN HARVEST OF GREATER SAPHENOUS VEIN Right 10/04/2021   Procedure: ENDOVEIN HARVEST OF GREATER SAPHENOUS VEIN;  Surgeon: Loreli Slot, MD;  Location: [facility] OR;  Service: Open Heart Surgery;  Laterality: Right;   IMPLANTATION OF HYPOGLOSSAL NERVE STIMULATOR Right 02/17/2020   Procedure: IMPLANTATION OF HYPOGLOSSAL NERVE STIMULATOR;  Surgeon: Christia Reading, MD;  Location: [facility] OR;  Service: ENT;  Laterality: Right;   INGUINAL HERNIA REPAIR     KIDNEY STONE SURGERY     stent   REVERSE SHOULDER ARTHROPLASTY Left 03/08/2021   Procedure: REVERSE SHOULDER ARTHROPLASTY;  Surgeon: Bjorn Pippin, MD;  Location: [facility] ORS;  Service: Orthopedics;  Laterality: Left;   RIGHT/LEFT HEART CATH AND CORONARY ANGIOGRAPHY N/A 09/01/2021   Procedure: RIGHT/LEFT HEART CATH AND CORONARY ANGIOGRAPHY;  Surgeon: Corky Crafts, MD;  Location: [facility] INVASIVE CV LAB;  Service: Cardiovascular;  Laterality: N/A;   ROTATOR CUFF REPAIR Right    TEE WITHOUT CARDIOVERSION N/A 09/01/2021   Procedure: TRANSESOPHAGEAL ECHOCARDIOGRAM (TEE);  Surgeon: Wendall Stade, MD;  Location: [facility] ENDOSCOPY;  Service: Cardiovascular;  Laterality: N/A;   TEE WITHOUT CARDIOVERSION N/A 10/04/2021   Procedure: TRANSESOPHAGEAL ECHOCARDIOGRAM (TEE);  Surgeon: Loreli Slot, MD;  Location: [facility] OR;  Service: Open Heart Surgery;  Laterality: N/A;     Current Outpatient Medications on File Prior to Visit   Medication Sig Dispense Refill   acetaminophen (TYLENOL) 500 MG tablet Take 1,000 mg by mouth every 8 (eight) hours as needed for moderate pain.     amLODipine (NORVASC) 2.5 MG tablet Take 2.5 mg by mouth daily.     amoxicillin (AMOXIL) 500 MG capsule Take 4 capsules (2,000mg ) by mouth 30-60 minutes before dental exams/procedures 4 capsule 6   Ascorbic Acid (VITAMIN C) 1000 MG tablet Take 1,000 mg by mouth in the morning.     aspirin EC 81 MG EC tablet Take 1 tablet (81 mg total) by mouth daily. Swallow whole. 30 tablet 11   atorvastatin (LIPITOR) 40 MG tablet TAKE 1 TABLET EVERY DAY 90 tablet 3   Cholecalciferol (VITAMIN D) 50 MCG (2000 UT) tablet Take 2,000 Units by mouth daily.     Collagen-Vitamin C (SUPER COLLAGEN PLUS VITAMIN C PO) Take 1 tablet by mouth daily.     Cyanocobalamin (B-12) 5000 MCG CAPS Take 5,000 mcg by mouth daily.     docusate sodium (COLACE) 100 MG capsule Take 100 mg by mouth daily as needed for moderate constipation.     doxycycline (VIBRAMYCIN) 100 MG capsule Take 1 capsule (100 mg total) by mouth 2 (two) times daily. 14 capsule 0   furosemide (LASIX) 20 MG tablet Take 1 tablet (20 mg total) by mouth daily. 30 tablet 11   LORazepam (ATIVAN) 0.5 MG tablet Take 0.5 mg by mouth at bedtime.     LYRICA 75 MG capsule Take 75 mg by mouth daily.     magnesium oxide (MAG-OX) 400 MG tablet Take 400 mg by mouth daily.     Multiple Vitamins-Minerals (ONE-A-DAY MENS 50+ ADVANTAGE) TABS Take 1 tablet by mouth daily with breakfast.  nitroGLYCERIN (NITROSTAT) 0.4 MG SL tablet Place 1 tablet (0.4 mg total) under the tongue every 5 (five) minutes as needed for chest pain. 25 tablet 3   omeprazole (PRILOSEC) 40 MG capsule Take 40 mg by mouth daily.     oxymetazoline (AFRIN) 0.05 % nasal spray Place 1 spray into both nostrils at bedtime.     Polyvinyl Alcohol-Povidone (REFRESH OP) Place 1 drop into the left eye daily. Refresh eye drops     rivaroxaban (XARELTO) 20 MG TABS tablet Take  1 tablet (20 mg total) by mouth daily with supper. 90 tablet 3   rOPINIRole (REQUIP) 4 MG tablet Take 4 mg by mouth at bedtime.     tamsulosin (FLOMAX) 0.4 MG CAPS capsule Take 0.4 mg by mouth 2 (two) times daily.     Testosterone Cypionate 200 MG/ML SOLN Inject 200 mg into the muscle every 14 (fourteen) days.      tiZANidine (ZANAFLEX) 2 MG tablet Take 2 mg by mouth every 6 (six) hours as needed for muscle spasms.     No current facility-administered medications on file prior to visit.    Allergies  Allergen Reactions   Sulfa Antibiotics Itching   Lotensin [Benazepril Hcl] Hives and Rash     DIAGNOSTIC DATA (LABS, IMAGING, TESTING) - I reviewed patient records, labs, notes, testing and imaging myself where available.  Lab Results  Component Value Date   WBC 6.2 11/27/2022   HGB 14.7 11/27/2022   HCT 44.6 11/27/2022   MCV 89 11/27/2022   PLT 199 11/27/2022      Component Value Date/Time   NA 140 01/09/2023 1444   K 4.6 01/09/2023 1444   CL 100 01/09/2023 1444   CO2 26 01/09/2023 1444   GLUCOSE 82 01/09/2023 1444   GLUCOSE 118 (H) 10/10/2021 0220   BUN 14 01/09/2023 1444   CREATININE 1.18 01/09/2023 1444   CALCIUM 10.1 01/09/2023 1444   PROT 6.3 03/28/2022 1302   ALBUMIN 4.1 03/28/2022 1302   AST 14 03/28/2022 1302   ALT 9 03/28/2022 1302   ALKPHOS 26 (L) 03/28/2022 1302   BILITOT 0.4 03/28/2022 1302   GFRNONAA >60 10/10/2021 0220   GFRAA 53 (L) 10/20/2020 1156   Lab Results  Component Value Date   CHOL 190 02/08/2022   HDL 55 02/08/2022   LDLCALC 92 02/08/2022   TRIG 261 (H) 02/08/2022   CHOLHDL 3.5 02/08/2022   Lab Results  Component Value Date   HGBA1C 5.4 10/02/2021   No results found for: "VITAMINB12" Lab Results  Component Value Date   TSH 1.310 11/27/2022    PHYSICAL EXAM:  Today's Vitals   04/16/23 1115  Weight: 205 lb (93 kg)  Height: 5\' 8"  (1.727 m)   Body mass index is 31.17 kg/m.   Wt Readings from Last 3 Encounters:  04/16/23 205  lb (93 kg)  01/14/23 206 lb (93.4 kg)  01/09/23 207 lb (93.9 kg)     Ht Readings from Last 3 Encounters:  04/16/23 5\' 8"  (1.727 m)  01/14/23 5\' 9"  (1.753 m)  01/09/23 5\' 8"  (1.727 m)      General: The patient is awake, alert and appears not in acute distress. The patient is well groomed. Head: Cardiovascular:  Regular rate and cardiac rhythm by pulse,  without distended neck veins. Respiratory: Lungs are clear to auscultation.  Trunk: The patient's posture is erect.   NEUROLOGIC EXAM: The patient is awake and alert, oriented to place and time.   Memory subjective described  as intact.  Attention span & concentration ability appears normal.  Speech is fluent,  without  dysarthria, dysphonia or aphasia.  Mood and affect are appropriate.   Cranial nerves: no loss of smell or taste reported  Hearing was impaired to  finger rubbing.    Facial sensation intact to fine touch.  Facial motor strength is symmetric and tongue and uvula move midline.  Neck ROM : rotation, tilt and flexion extension were normal for age and shoulder shrug was symmetrical.    Motor exam:  Symmetric bulk, tone and ROM.   Normal tone without cog wheeling, symmetric grip strength .        Wave forms were optained and will be copied into the report.  He was started on 0.9 V in may 2024 and now is on 1.3V with better refreshing quality of sleep.  Appointment Therapist, nutritional ) (Newest Message First) View All Conversations on this Encounter    Linward Natal      Appointment Therapist, nutritional ) (Newest Message First) View All Conversations on this Encounter Linward Natal routed conversation to [place] hours ago (1:35 PM)   Vickie Epley L3 hours ago (1:35 PM)   EE                               Note       Recent Patient Communication   ASSESSMENT AND PLAN 77 y.o. year old male INSPIRE patient  here with:  0) HST that did not confirm effective treatment with inspire.     1)  INSPIRE , and  happy at 1.3V no matter h what the residual AHI may be.     I plan to follow up either personally or through our NP within 12 months.   I would like to thank Alinda Deem, MD for allowing me to meet with and to take care of this pleasant patient.   CC: I will share my notes with Dr.Peter Nishan .  After spending a total time of  25  minutes face to face and additional time for physical and neurologic examination, review of laboratory studies,  personal review of imaging studies, reports and results of other testing and review of referral information / records as far as provided in visit,   Electronically signed by: Melvyn Novas, MD 04/16/2023 12:11 PM  [facility] Sleep Board certified by The [employer] of Sleep Medicine and Diplomate of the [employer] of Sleep Medicine. Board certified In Neurology through the ABPN, Fellow of the [employer] of Neurology.

## 2023-04-17 ENCOUNTER — Ambulatory Visit: Payer: Medicare Other | Admitting: Neurology

## 2023-05-16 ENCOUNTER — Encounter: Payer: Self-pay | Admitting: Cardiovascular Disease

## 2023-05-16 DIAGNOSIS — I351 Nonrheumatic aortic (valve) insufficiency: Secondary | ICD-10-CM

## 2023-05-16 DIAGNOSIS — Z952 Presence of prosthetic heart valve: Secondary | ICD-10-CM

## 2023-06-13 ENCOUNTER — Ambulatory Visit (HOSPITAL_COMMUNITY): Payer: Medicare Other | Attending: Cardiovascular Disease

## 2023-06-13 DIAGNOSIS — I351 Nonrheumatic aortic (valve) insufficiency: Secondary | ICD-10-CM | POA: Insufficient documentation

## 2023-06-13 DIAGNOSIS — Z952 Presence of prosthetic heart valve: Secondary | ICD-10-CM | POA: Insufficient documentation

## 2023-06-13 LAB — ECHOCARDIOGRAM COMPLETE
AR max vel: 1.43 cm2
AV Area VTI: 1.46 cm2
AV Area mean vel: 1.33 cm2
AV Mean grad: 6 mm[Hg]
AV Peak grad: 11.7 mm[Hg]
Ao pk vel: 1.71 m/s
Area-P 1/2: 4.12 cm2
S' Lateral: 3 cm

## 2023-07-24 ENCOUNTER — Ambulatory Visit: Payer: Medicare Other | Admitting: Cardiovascular Disease

## 2023-08-13 NOTE — Progress Notes (Signed)
Cardiology Office Note    Date:  08/19/2023   ID:  Adrian Franco, DOB 1945-12-10, MRN [id number]   PCP:  Alinda Deem, MD   [facility]  Cardiologist:  Charlton Haws, MD     History of Present Illness:  Adrian Franco is a 77 y.o. male with a History of Hypertension, Hyperlipidemia, DVT, PE,aortic insufficiency, ascending aneurysm, sleep apnea, arthritis, BPH, back pain and depression,   Patient developed severe AI and underwent CABG x1 and biologic bentall Aortic valve conduit 10/04/21. He had post op Afib converted to NSR with IV Amio. Also AKI and irbesartan stopped.  March 2023 had fatigue and malaise Amiodarone d/c and lopressor decreased Dizziness improved Has had issues with left knee with bloody effusion seeing Dr Madelon Lips   Seen by Dr Elease Hashimoto for dyspnea 11/27/22. He is a Marine scientist and farms Chronic edema LLE from prior DVT. 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 Aneurysm (HCC)   . Anxiety   . Aortic aneurysm (HCC)    followed by Dr Eden Emms  . Arthritis   . Atypical chest pain   . BPH (benign prostatic hypertrophy)   . Chronic back pain   . Depression   . DVT (deep venous thrombosis) (HCC)   . GERD (gastroesophageal reflux disease)   . History of kidney stones   . HTN (hypertension)   . Hyperlipemia   . Hypogonadism in male   . Insomnia   . Localized edema   . PE (pulmonary embolism)   . Renal insufficiency   . Restless leg syndrome   . Sleep apnea    severe  OSA-cannot tolerate CPAP  . SOB (shortness of breath)   . Testicular hypofunction     Past Surgical History:  Procedure Laterality Date  . BENTALL PROCEDURE N/A 10/04/2021   Procedure: BENTALL PROCEDURE USING 25 MM KONNECT RESILIA  AORTIC VALVED CONDUIT;  Surgeon: Loreli Slot, MD;  [facility];  Service: Open Heart Surgery;  Laterality: N/A;  . CORONARY ARTERY BYPASS GRAFT N/A 10/04/2021   Procedure: CORONARY ARTERY BYPASS GRAFTING (CABG) TIMES ONE USING RIGHT GREATER SAPHENOUS VEIN;  Surgeon: Loreli Slot, MD;  Location: [facility] OR;  Service: Open Heart Surgery;  Laterality: N/A;  . DRUG INDUCED ENDOSCOPY N/A 12/09/2019   Procedure: DRUG INDUCED ENDOSCOPY;  Surgeon: Christia Reading, MD;  Location: [facility];  Service: ENT;  Laterality: N/A;  . ENDOVEIN HARVEST OF GREATER SAPHENOUS VEIN Right 10/04/2021   Procedure: ENDOVEIN HARVEST OF GREATER SAPHENOUS VEIN;  Surgeon: Loreli Slot, MD;  Location: [facility] OR;  Service: Open Heart Surgery;  Laterality: Right;  . IMPLANTATION OF HYPOGLOSSAL NERVE STIMULATOR Right 02/17/2020   Procedure: IMPLANTATION OF HYPOGLOSSAL NERVE STIMULATOR;  Surgeon: Christia Reading, MD;  Location: [facility] OR;  Service: ENT;  Laterality: Right;  . INGUINAL HERNIA REPAIR    . KIDNEY STONE SURGERY     stent  . REVERSE SHOULDER ARTHROPLASTY  Left 03/08/2021   Procedure: REVERSE SHOULDER ARTHROPLASTY;  Surgeon: Bjorn Pippin, MD;  Location: [facility] ORS;  Service: Orthopedics;  Laterality: Left;  . RIGHT/LEFT HEART CATH AND CORONARY ANGIOGRAPHY N/A 09/01/2021   Procedure: RIGHT/LEFT HEART CATH AND CORONARY ANGIOGRAPHY;  Surgeon: Corky Crafts, MD;  Location: [facility] INVASIVE CV LAB;  Service: Cardiovascular;  Laterality: N/A;  . ROTATOR CUFF REPAIR Right   . TEE WITHOUT CARDIOVERSION N/A 09/01/2021   Procedure: TRANSESOPHAGEAL ECHOCARDIOGRAM (TEE);  Surgeon: Wendall Stade, MD;  Location: [facility] ENDOSCOPY;  Service: Cardiovascular;  Laterality: N/A;  . TEE  WITHOUT CARDIOVERSION N/A 10/04/2021   Procedure: TRANSESOPHAGEAL ECHOCARDIOGRAM (TEE);  Surgeon: Loreli Slot, MD;  Location: [facility] OR;  Service: Open Heart Surgery;  Laterality: N/A;    Current Medications: Current Meds  Medication Sig  . acetaminophen (TYLENOL) 500 MG tablet Take 1,000 mg by mouth every 8 (eight) hours as needed for moderate pain.  Marland Kitchen amLODipine (NORVASC) 2.5 MG tablet Take 2.5 mg by mouth daily.  . Ascorbic Acid (VITAMIN C) 1000 MG tablet Take 1,000 mg by mouth in the morning.  Marland Kitchen aspirin EC 81 MG EC tablet Take 1 tablet (81 mg total) by mouth daily. Swallow whole.  Marland Kitchen atorvastatin (LIPITOR) 40 MG tablet TAKE 1 TABLET EVERY DAY  . Cholecalciferol (VITAMIN D) 50 MCG (2000 UT) tablet Take 2,000 Units by mouth daily.  . Collagen-Vitamin C (SUPER COLLAGEN PLUS VITAMIN C PO) Take 1 tablet by mouth daily.  . Cyanocobalamin (B-12) 5000 MCG CAPS Take 5,000 mcg by mouth daily.  Marland Kitchen docusate sodium (COLACE) 100 MG capsule Take 100 mg by mouth daily as needed for moderate constipation.  . furosemide (LASIX) 20 MG tablet Take 1 tablet (20 mg total) by mouth daily.  Marland Kitchen LORazepam (ATIVAN) 0.5 MG tablet Take 0.5 mg by mouth at bedtime.  Marland Kitchen LYRICA 75 MG capsule Take 75 mg by mouth 2 (two) times daily.  . magnesium oxide (MAG-OX) 400 MG tablet Take 400 mg by mouth daily.  . Multiple Vitamins-Minerals (ONE-A-DAY MENS 50+ ADVANTAGE) TABS Take 1 tablet by mouth daily with breakfast.  . nitroGLYCERIN (NITROSTAT) 0.4 MG SL tablet Place 1 tablet (0.4 mg total) under the tongue every 5 (five) minutes as needed for chest pain.  Marland Kitchen omeprazole (PRILOSEC) 40 MG capsule Take 40 mg by mouth daily.  Marland Kitchen oxymetazoline (AFRIN) 0.05 % nasal spray Place 1 spray into both nostrils at bedtime.  . Polyvinyl Alcohol-Povidone (REFRESH OP) Place 1 drop into the left eye daily. Refresh eye drops  . rivaroxaban (XARELTO) 20 MG TABS tablet Take 1 tablet (20 mg total) by mouth daily with supper.  Marland Kitchen rOPINIRole (REQUIP) 4  MG tablet Take 4 mg by mouth at bedtime.  . tamsulosin (FLOMAX) 0.4 MG CAPS capsule Take 0.4 mg by mouth 2 (two) times daily.  . Testosterone Cypionate 200 MG/ML SOLN Inject 200 mg into the muscle every 14 (fourteen) days.   Marland Kitchen tiZANidine (ZANAFLEX) 2 MG tablet Take 2 mg by mouth every 6 (six) hours as needed for muscle spasms.     Allergies:   Sulfa antibiotics and Lotensin [benazepril hcl]   Social History   Socioeconomic History  . Marital status: Married    Spouse name: [name]  . Number of children: 2  . Years of education: Not on file  . Highest education level: Associate degree: occupational, Scientist, product/process development, or vocational program  Occupational History  . Not on file  Tobacco Use  . Smoking status: Never  Passive exposure: Never  . Smokeless tobacco: Never  Vaping Use  . Vaping status: Never Used  Substance and Sexual Activity  . Alcohol use: No    Alcohol/week: 0.0 standard drinks of alcohol  . Drug use: No  . Sexual activity: Yes    Comment: married  Other Topics Concern  . Not on file  Social History Narrative   Lives with wife   R handed   Caffeine: 2 C of coffee, diet sun drop a day   Social Drivers of Corporate investment banker Strain: Not on file  Food Insecurity: Not on file  Transportation Needs: Not on file  Physical Activity: Not on file  Stress: Not on file  Social Connections: Not on file     Family History:  The patient's  family history includes Healthy in his brother, sister, son, and son; Heart attack in his father; Hyperlipidemia in his father and mother; Other in his brother.   ROS:   Please see the history of present illness.    ROS All other systems reviewed and are negative.   PHYSICAL EXAM:   VS:  BP 130/80 (BP Location: Right Arm, Patient Position: Sitting, Cuff Size: Large)   Pulse 88   Resp 16   Ht 5\' 8"  (1.727 m)   Wt 206 lb 12.8 oz (93.8 kg)   SpO2 96%   BMI 31.44 kg/m   Physical Exam   Affect appropriate Healthy:  appears  stated age HEENT: normal Neck supple with no adenopathy JVP normal no bruits no thyromegaly Lungs clear with no wheezing and good diaphragmatic motion Heart:  S1/S2 SEM through AVR post sternotomy Abdomen: benighn, BS positve, no tenderness, no AAA no bruit.  No HSM or HJR LLE chronic phlebitis with plus 1 edema Neuro non-focal Skin warm and dry No muscular weakness   Wt Readings from Last 3 Encounters:  08/19/23 206 lb 12.8 oz (93.8 kg)  04/16/23 205 lb (93 kg)  01/14/23 206 lb (93.4 kg)      Studies/Labs Reviewed:   EKG: 11/27/22 SR rate 92 ICRBBB no acute changes   Recent Labs: 11/27/2022: Hemoglobin 14.7; Platelets 199; TSH 1.310 01/09/2023: BUN 14; Creatinine, Ser 1.18; NT-Pro BNP 102; Potassium 4.6; Sodium 140   Lipid Panel    Component Value Date/Time   CHOL 190 02/08/2022 1127   TRIG 261 (H) 02/08/2022 1127   HDL 55 02/08/2022 1127   CHOLHDL 3.5 02/08/2022 1127   LDLCALC 92 02/08/2022 1127    Additional studies/ records that were reviewed today include:     LLE Venous Duplex 10/31/22 TTE 12/27/22 Myovue 12/28/22     PLAN:  In order of problems listed above:  CABG/AVR:  10/04/21 [place] Biologic Bentall 25 mm Konect Resilia valve conduit with SVG to RCA.  TTE 06/13/23  with normal valve function and Myovue with no ischemia SBE prophylaxis    HTN  .lopressor dose decreased march 2023 12.5 mg bid improved   HLD on Lipitor LDL 92   History of DVT & PE on Xarelto post sclerotherapy Dr Randie Heinz chronic LLE phlebitis   Diastolic Dysfunction:  lasix added 12/31/22  BNP normal 01/09/23   OSA has inspire device F/U Dr Dohmeier   F/U in a year   Medication Adjustments/Labs and Tests Ordered: Current medicines are reviewed at length with the patient today.  Concerns regarding medicines are outlined above.  Medication changes, Labs and Tests ordered today are listed in the Patient Instructions below. There are no Patient  Instructions on file for this visit.     Signed, Charlton Haws, MD  08/19/2023 3:54 PM    [facility] [address] [place], [place], [place]  [address] Phone: [phone]; Fax: [phone]

## 2023-08-19 ENCOUNTER — Encounter: Payer: Self-pay | Admitting: Cardiovascular Disease

## 2023-08-19 ENCOUNTER — Ambulatory Visit: Payer: Medicare Other | Attending: Cardiovascular Disease | Admitting: Cardiovascular Disease

## 2023-08-19 VITALS — BP 130/80 | HR 88 | Resp 16 | Ht 68.0 in | Wt 206.8 lb

## 2023-08-19 DIAGNOSIS — I872 Venous insufficiency (chronic) (peripheral): Secondary | ICD-10-CM | POA: Diagnosis present

## 2023-08-19 DIAGNOSIS — I25810 Atherosclerosis of coronary artery bypass graft(s) without angina pectoris: Secondary | ICD-10-CM | POA: Insufficient documentation

## 2023-08-19 DIAGNOSIS — I10 Essential (primary) hypertension: Secondary | ICD-10-CM | POA: Insufficient documentation

## 2023-08-19 DIAGNOSIS — Z952 Presence of prosthetic heart valve: Secondary | ICD-10-CM | POA: Diagnosis not present

## 2023-08-19 NOTE — Patient Instructions (Signed)
Medication Instructions:  Your physician recommends that you continue on your current medications as directed. Please refer to the Current Medication list given to you today.  *If you need a refill on your cardiac medications before your next appointment, please call your pharmacy*  Lab Work: If you have labs (blood work) drawn today and your tests are completely normal, you will receive your results only by: MyChart Message (if you have MyChart) OR A paper copy in the mail If you have any lab test that is abnormal or we need to change your treatment, we will call you to review the results.  Testing/Procedures: None ordered today.  Follow-Up: At [facility], you and your health needs are our priority.  As part of our continuing mission to provide you with exceptional heart care, we have created designated Provider Care Teams.  These Care Teams include your primary Cardiologist (physician) and Advanced Practice Providers (APPs -  Physician Assistants and Nurse Practitioners) who all work together to provide you with the care you need, when you need it.  We recommend signing up for the patient portal called "MyChart".  Sign up information is provided on this After Visit Summary.  MyChart is used to connect with patients for Virtual Visits (Telemedicine).  Patients are able to view lab/test results, encounter notes, upcoming appointments, etc.  Non-urgent messages can be sent to your provider as well.   To learn more about what you can do with MyChart, go to [email or web address].    Your next appointment:   6 month(s)  Provider:   Peter Nishan, MD      

## 2023-09-11 ENCOUNTER — Ambulatory Visit: Payer: Medicare Other | Admitting: Orthopaedic Surgery

## 2023-09-11 ENCOUNTER — Other Ambulatory Visit (INDEPENDENT_AMBULATORY_CARE_PROVIDER_SITE_OTHER): Payer: Self-pay

## 2023-09-11 DIAGNOSIS — M25532 Pain in left wrist: Secondary | ICD-10-CM | POA: Diagnosis not present

## 2023-09-11 DIAGNOSIS — S52502A Unspecified fracture of the lower end of left radius, initial encounter for closed fracture: Secondary | ICD-10-CM | POA: Insufficient documentation

## 2023-09-11 DIAGNOSIS — S52572A Other intraarticular fracture of lower end of left radius, initial encounter for closed fracture: Secondary | ICD-10-CM

## 2023-09-11 MED ORDER — HYDROCODONE-ACETAMINOPHEN 5-325 MG PO TABS: 1.0000 | ORAL_TABLET | Freq: Four times a day (QID) | ORAL | 0 refills | Status: DC | PRN

## 2023-09-11 NOTE — Progress Notes (Addendum)
 Office Visit Note   Patient: Adrian Franco           Date of Birth: [birth year]           MRN: [id number] Visit Date: 09/11/2023              Requested by: Linford Ribas, MD [address] [place],  [place] [address] PCP: Linford Ribas, MD   Assessment & Plan: Visit Diagnoses:  1. Pain in left wrist   2. Other closed intra-articular fracture of distal end of left radius, initial encounter     Plan: Sugar-tong applied return 2 weeks for sugar-tong removal short arm fiberglass cast. GLOBAL  Follow-Up Instructions: No follow-ups on file.   Orders:  Orders Placed This Encounter  Procedures   XR Wrist Complete Left   Meds ordered this encounter  Medications   HYDROcodone -acetaminophen  (NORCO/VICODIN) 5-325 MG tablet    Sig: Take 1-2 tablets by mouth every 6 (six) hours as needed for moderate pain (pain score 4-6).    Dispense:  40 tablet    Refill:  0    Acute distal radius fracture      Procedures: No procedures performed   Clinical Data: No additional findings.   Subjective: Chief Complaint  Patient presents with   Left Wrist - Pain    HPI 78 year old male with No fell yesterday backwards try to get himself with the acute left wrist pain.  Past history of rotator cuff surgery lumbar surgery.  He had pain and swelling in his wrist pain with rotation.  Review of Systems all systems noncontributory to HPI. Of note his aortic regurgitation sleep apnea hypertension history of DVT and lumbar fusion, cardiac surgery.  Previous PE.  Objective: Vital Signs: There were no vitals taken for this visit.  Physical Exam Constitutional:      Appearance: He is well-developed.  HENT:     Head: Normocephalic and atraumatic.     Right Ear: External ear normal.     Left Ear: External ear normal.  Eyes:     Pupils: Pupils are equal, round, and reactive to light.  Neck:     Thyroid: No thyromegaly.     Trachea: No tracheal deviation.  Cardiovascular:     Rate and  Rhythm: Normal rate.  Pulmonary:     Effort: Pulmonary effort is normal.     Breath sounds: No wheezing.  Abdominal:     General: Bowel sounds are normal.     Palpations: Abdomen is soft.  Musculoskeletal:     Cervical back: Neck supple.  Skin:    General: Skin is warm and dry.     Capillary Refill: Capillary refill takes less than 2 seconds.  Neurological:     Mental Status: He is alert and oriented to person, place, and time.  Psychiatric:        Behavior: Behavior normal.        Thought Content: Thought content normal.        Judgment: Judgment normal.     Ortho Exam wrist swelling tenderness distal radius.  Sensation of the fingertips is intact.  Specialty Comments:  No specialty comments available.  Imaging: XR Wrist Complete Left Result Date: 09/11/2023 Three-view x-rays left wrist demonstrates distal radius intra-articular fracture involving the  dorsal ulnar aspect with 1 mm displacement.  Ulna and ulnar styloid is intact. Impression: Left distal radius intra-articular fracture minimal displacement.    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 10/04/2021   Aortic valve regurgitation    RLS (restless legs syndrome) 07/27/2020   Encntr for adjust and mgmt of implanted nervous sys device 07/19/2020   S/P lumbar fusion 06/10/2020   Intolerance of continuous positive airway pressure (CPAP) ventilation 10/25/2019   OSA (obstructive sleep apnea) 10/25/2019   Pulmonary emboli (HCC) 03/30/2016   DVT (deep venous thrombosis) (HCC) 03/30/2016   Swelling of ankle 03/15/2016   HTN (hypertension)    Atypical chest pain    SOB (shortness of  breath)    Past Medical History:  Diagnosis Date   Aneurysm (HCC)    Anxiety    Aortic aneurysm (HCC)    followed by Dr Stann Earnest   Arthritis    Atypical chest pain    BPH (benign prostatic hypertrophy)    Chronic back pain    Depression    DVT (deep venous thrombosis) (HCC)    GERD (gastroesophageal reflux disease)    History of kidney stones    HTN (hypertension)    Hyperlipemia    Hypogonadism in male    Insomnia    Localized edema    PE (pulmonary embolism)    Renal insufficiency    Restless leg syndrome    Sleep apnea    severe OSA-cannot tolerate CPAP   SOB (shortness of breath)    Testicular hypofunction     Family History  Problem Relation Age of Onset   Hyperlipidemia Mother    Hyperlipidemia Father    Heart attack Father    Healthy Son    Healthy Brother    Other Brother    Healthy Sister    Healthy Son     Past Surgical History:  Procedure Laterality Date   BENTALL PROCEDURE N/A 10/04/2021   Procedure: BENTALL PROCEDURE USING 25 MM KONNECT RESILIA  AORTIC VALVED CONDUIT;  Surgeon: Zelphia Higashi, MD;  Location: [facility] OR;  Service: Open Heart Surgery;  Laterality: N/A;   CORONARY ARTERY BYPASS GRAFT N/A 10/04/2021   Procedure: CORONARY ARTERY BYPASS GRAFTING (CABG) TIMES ONE USING RIGHT GREATER SAPHENOUS VEIN;  Surgeon: Zelphia Higashi, MD;  Location: [facility] OR;  Service: Open Heart Surgery;  Laterality: N/A;   DRUG INDUCED ENDOSCOPY N/A 12/09/2019   Procedure: DRUG INDUCED ENDOSCOPY;  Surgeon: Virgina Grills, MD;  Location: [facility];  Service: ENT;  Laterality: N/A;   ENDOVEIN HARVEST OF GREATER SAPHENOUS VEIN Right 10/04/2021   Procedure: ENDOVEIN HARVEST OF GREATER SAPHENOUS VEIN;  Surgeon: Zelphia Higashi, MD;  Location[facility] OR;  Service: Open Heart Surgery;  Laterality: Right;   IMPLANTATION OF HYPOGLOSSAL NERVE STIMULATOR Right 02/17/2020   Procedure: IMPLANTATION OF HYPOGLOSSAL NERVE STIMULATOR;  Surgeon: Virgina Grills, MD;   Location: [facility] OR;  Service: ENT;  Laterality: Right;   INGUINAL HERNIA REPAIR     KIDNEY STONE SURGERY     stent   REVERSE SHOULDER ARTHROPLASTY Left 03/08/2021   Procedure: REVERSE SHOULDER ARTHROPLASTY;  Surgeon: Micheline Ahr, MD;  Location: [facility];  Service: Orthopedics;  Laterality: Left;   RIGHT/LEFT HEART CATH AND CORONARY ANGIOGRAPHY N/A 09/01/2021   Procedure: RIGHT/LEFT HEART CATH AND CORONARY ANGIOGRAPHY;  Surgeon: Lucendia Rusk, MD;  Location: [facility] INVASIVE CV LAB;  Service: Cardiovascular;  Laterality:  N/A;   ROTATOR CUFF REPAIR Right    TEE WITHOUT CARDIOVERSION N/A 09/01/2021   Procedure: TRANSESOPHAGEAL ECHOCARDIOGRAM (TEE);  Surgeon: Loyde Rule, MD;  Location: [facility] ENDOSCOPY;  Service: Cardiovascular;  Laterality: N/A;   TEE WITHOUT CARDIOVERSION N/A 10/04/2021   Procedure: TRANSESOPHAGEAL ECHOCARDIOGRAM (TEE);  Surgeon: Zelphia Higashi, MD;  Location: [facility] OR;  Service: Open Heart Surgery;  Laterality: N/A;   Social History   Occupational History   Not on file  Tobacco Use   Smoking status: Never    Passive exposure: Never   Smokeless tobacco: Never  Vaping Use   Vaping status: Never Used  Substance and Sexual Activity   Alcohol use: No    Alcohol/week: 0.0 standard drinks of alcohol   Drug use: No   Sexual activity: Yes    Comment: married

## 2023-09-21 ENCOUNTER — Other Ambulatory Visit: Payer: Self-pay | Admitting: Cardiovascular Disease

## 2023-09-26 ENCOUNTER — Other Ambulatory Visit (INDEPENDENT_AMBULATORY_CARE_PROVIDER_SITE_OTHER): Payer: Self-pay

## 2023-09-26 ENCOUNTER — Ambulatory Visit: Payer: Medicare Other | Admitting: Orthopaedic Surgery

## 2023-09-26 VITALS — Ht 68.0 in | Wt 206.0 lb

## 2023-09-26 DIAGNOSIS — M25532 Pain in left wrist: Secondary | ICD-10-CM

## 2023-09-26 NOTE — Progress Notes (Signed)
Office Visit Note   Patient: Adrian Franco           Date of Birth: 01-Feb-1946           MRN: [id number] Visit Date: 09/26/2023              Requested by: Alinda Deem, MD [address] [place],  [place] [address] PCP: Alinda Deem, MD   Assessment & Plan: Visit Diagnoses:  1. Pain in left wrist     Plan:   Follow-Up Instructions: No follow-ups on file.   Orders:  Orders Placed This Encounter  Procedures   XR Wrist Complete Left   No orders of the defined types were placed in this encounter.     Procedures: No procedures performed   Clinical Data: No additional findings.   Subjective: Chief Complaint  Patient presents with   Left Wrist - Fracture, Follow-up    Fall 09/10/2023    HPI:  GLOBAL follow-up for distal radius fracture extended into the lunate fossa with 2 to 3 mm widening but no step-off.  He is able to gently supinate pronate.  Mild wrist swelling he is on Tylenol and ibuprofen for pain.  Sugar-tong removed x-rays reviewed with patient.  Short arm fiberglass cast applied return in 4 weeks for cast off and final x-rays.  Review of Systems unchanged   Objective: Vital Signs: Ht 5\' 8"  (1.727 m)   Wt 206 lb (93.4 kg)   BMI 31.32 kg/m   Physical Exam General Exam unchanged  Ortho Exam mild swelling tenderness over the distal radius lunate fossa region.  He lacks 10 to 15 degrees symmetrical supination pronation in comparison to his normal right wrist.  Specialty Comments:  No specialty comments available.  Imaging: No results found.   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 10/04/2021   Aortic valve  regurgitation    RLS (restless legs syndrome) 07/27/2020   Encntr for adjust and mgmt of implanted nervous sys device 07/19/2020   S/P lumbar fusion 06/10/2020   Intolerance of continuous positive airway pressure (CPAP) ventilation 10/25/2019   OSA (obstructive sleep apnea) 10/25/2019   Pulmonary emboli (HCC) 03/30/2016   DVT (deep venous thrombosis) (HCC) 03/30/2016   Swelling of ankle 03/15/2016   HTN (hypertension)    Atypical chest pain    SOB (shortness of breath)    Past Medical History:  Diagnosis Date   Aneurysm (HCC)    Anxiety    Aortic aneurysm (HCC)    followed by Dr Eden Emms   Arthritis    Atypical chest pain    BPH (benign prostatic hypertrophy)    Chronic back pain    Depression    DVT (deep venous thrombosis) (HCC)    GERD (gastroesophageal reflux disease)    History of kidney stones    HTN (hypertension)    Hyperlipemia    Hypogonadism in male    Insomnia    Localized edema    PE (pulmonary embolism)    Renal insufficiency    Restless leg syndrome    Sleep apnea    severe OSA-cannot tolerate CPAP   SOB (shortness of breath)    Testicular hypofunction     Family  History  Problem Relation Age of Onset   Hyperlipidemia Mother    Hyperlipidemia Father    Heart attack Father    Healthy Son    Healthy Brother    Other Brother    Healthy Sister    Healthy Son     Past Surgical History:  Procedure Laterality Date   BENTALL PROCEDURE N/A 10/04/2021   Procedure: BENTALL PROCEDURE USING 25 MM KONNECT RESILIA  AORTIC VALVED CONDUIT;  Surgeon: Loreli Slot, MD;  [facility];  Service: Open Heart Surgery;  Laterality: N/A;   CORONARY ARTERY BYPASS GRAFT N/A 10/04/2021   Procedure: CORONARY ARTERY BYPASS GRAFTING (CABG) TIMES ONE USING RIGHT GREATER SAPHENOUS VEIN;  Surgeon: Loreli Slot, MD;  Location: [facility] OR;  Service: Open Heart Surgery;  Laterality: N/A;   DRUG INDUCED ENDOSCOPY N/A 12/09/2019   Procedure: DRUG INDUCED ENDOSCOPY;  Surgeon:  Christia Reading, MD;  Location: [facility];  Service: ENT;  Laterality: N/A;   ENDOVEIN HARVEST OF GREATER SAPHENOUS VEIN Right 10/04/2021   Procedure: ENDOVEIN HARVEST OF GREATER SAPHENOUS VEIN;  Surgeon: Loreli Slot, MD;  Location: [facility] OR;  Service: Open Heart Surgery;  Laterality: Right;   IMPLANTATION OF HYPOGLOSSAL NERVE STIMULATOR Right 02/17/2020   Procedure: IMPLANTATION OF HYPOGLOSSAL NERVE STIMULATOR;  Surgeon: Christia Reading, MD;  Location: [facility] OR;  Service: ENT;  Laterality: Right;   INGUINAL HERNIA REPAIR     KIDNEY STONE SURGERY     stent   REVERSE SHOULDER ARTHROPLASTY Left 03/08/2021   Procedure: REVERSE SHOULDER ARTHROPLASTY;  Surgeon: Bjorn Pippin, MD;  Location: [facility] ORS;  Service: Orthopedics;  Laterality: Left;   RIGHT/LEFT HEART CATH AND CORONARY ANGIOGRAPHY N/A 09/01/2021   Procedure: RIGHT/LEFT HEART CATH AND CORONARY ANGIOGRAPHY;  Surgeon: Corky Crafts, MD;  Location: [facility] INVASIVE CV LAB;  Service: Cardiovascular;  Laterality: N/A;   ROTATOR CUFF REPAIR Right    TEE WITHOUT CARDIOVERSION N/A 09/01/2021   Procedure: TRANSESOPHAGEAL ECHOCARDIOGRAM (TEE);  Surgeon: Wendall Stade, MD;  Location: [facility] ENDOSCOPY;  Service: Cardiovascular;  Laterality: N/A;   TEE WITHOUT CARDIOVERSION N/A 10/04/2021   Procedure: TRANSESOPHAGEAL ECHOCARDIOGRAM (TEE);  Surgeon: Loreli Slot, MD;  Location: [facility] OR;  Service: Open Heart Surgery;  Laterality: N/A;   Social History   Occupational History   Not on file  Tobacco Use   Smoking status: Never    Passive exposure: Never   Smokeless tobacco: Never  Vaping Use   Vaping status: Never Used  Substance and Sexual Activity   Alcohol use: No    Alcohol/week: 0.0 standard drinks of alcohol   Drug use: No   Sexual activity: Yes    Comment: married

## 2023-10-24 ENCOUNTER — Encounter: Payer: Self-pay | Admitting: Orthopaedic Surgery

## 2023-10-24 ENCOUNTER — Other Ambulatory Visit (INDEPENDENT_AMBULATORY_CARE_PROVIDER_SITE_OTHER): Payer: Self-pay

## 2023-10-24 ENCOUNTER — Ambulatory Visit: Payer: Medicare Other | Admitting: Orthopaedic Surgery

## 2023-10-24 VITALS — Ht 68.0 in | Wt 206.0 lb

## 2023-10-24 DIAGNOSIS — S52572D Other intraarticular fracture of lower end of left radius, subsequent encounter for closed fracture with routine healing: Secondary | ICD-10-CM | POA: Diagnosis not present

## 2023-10-24 NOTE — Progress Notes (Signed)
 Post-Op Visit Note   Patient: Adrian Franco           Date of Birth: Oct 19, 1945           MRN: [id number] Visit Date: 10/24/2023 PCP: Alinda Deem, MD   Assessment & Plan: Global care.  Call distal radius intra-articular fracture.  X-rays today demonstrates interval healing.  He has motion occasionally a bit of sharp pain and extreme motion and 50% flexion extension noted today compared to his opposite wrist.  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  He will work on gentle wrist range of motion return if needed.  Chief Complaint:  Chief Complaint  Patient presents with   Left Wrist - Follow-up, Fracture    Fall 09/10/2023   Visit Diagnoses:  1. Other closed intra-articular fracture of distal end of left radius with routine healing, subsequent encounter     Plan: Wrist splint he can remove it for bathing and gentle range of motion follow-up if he has ongoing symptoms.  Follow-Up Instructions: No follow-ups on file.   Orders:  Orders Placed This Encounter  Procedures   XR Wrist Complete Left   No orders of the defined types were placed in this encounter.   Imaging: No results found.  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 10/04/2021   Aortic valve regurgitation    RLS (restless legs syndrome) 07/27/2020   Encntr for adjust and mgmt of implanted nervous sys device 07/19/2020   S/P lumbar fusion 06/10/2020   Intolerance of continuous positive airway pressure (CPAP) ventilation 10/25/2019   OSA (obstructive sleep apnea) 10/25/2019   Pulmonary  emboli (HCC) 03/30/2016   DVT (deep venous thrombosis) (HCC) 03/30/2016   Swelling of ankle 03/15/2016   HTN (hypertension)    Atypical chest pain    SOB (shortness of breath)    Past Medical History:  Diagnosis Date   Aneurysm (HCC)    Anxiety    Aortic aneurysm (HCC)    followed by Dr Eden Emms   Arthritis    Atypical chest pain    BPH (benign prostatic hypertrophy)    Chronic back pain    Depression    DVT (deep venous thrombosis) (HCC)    GERD (gastroesophageal reflux disease)    History of kidney stones    HTN (hypertension)    Hyperlipemia    Hypogonadism in male    Insomnia    Localized edema    PE (pulmonary embolism)    Renal insufficiency    Restless leg syndrome    Sleep apnea    severe OSA-cannot tolerate CPAP   SOB (shortness of breath)    Testicular hypofunction     Family History  Problem Relation Age of Onset   Hyperlipidemia Mother    Hyperlipidemia Father    Heart attack Father    Healthy Son    Healthy Brother    Other Brother    Healthy Sister    Healthy Son     Past Surgical History:  Procedure Laterality Date   BENTALL PROCEDURE N/A 10/04/2021   Procedure: BENTALL PROCEDURE USING  25 MM KONNECT RESILIA  AORTIC VALVED CONDUIT;  Surgeon: Loreli Slot, MD;  Location: [facility] OR;  Service: Open Heart Surgery;  Laterality: N/A;   CORONARY ARTERY BYPASS GRAFT N/A 10/04/2021   Procedure: CORONARY ARTERY BYPASS GRAFTING (CABG) TIMES ONE USING RIGHT GREATER SAPHENOUS VEIN;  Surgeon: Loreli Slot, MD;  Location: [facility] OR;  Service: Open Heart Surgery;  Laterality: N/A;   DRUG INDUCED ENDOSCOPY N/A 12/09/2019   Procedure: DRUG INDUCED ENDOSCOPY;  Surgeon: Christia Reading, MD;  Location: [facility];  Service: ENT;  Laterality: N/A;   ENDOVEIN HARVEST OF GREATER SAPHENOUS VEIN Right 10/04/2021   Procedure: ENDOVEIN HARVEST OF GREATER SAPHENOUS VEIN;  Surgeon: Loreli Slot, MD;  Location: [facility] OR;  Service: Open Heart Surgery;   Laterality: Right;   IMPLANTATION OF HYPOGLOSSAL NERVE STIMULATOR Right 02/17/2020   Procedure: IMPLANTATION OF HYPOGLOSSAL NERVE STIMULATOR;  Surgeon: Christia Reading, MD;  Location: [facility] OR;  Service: ENT;  Laterality: Right;   INGUINAL HERNIA REPAIR     KIDNEY STONE SURGERY     stent   REVERSE SHOULDER ARTHROPLASTY Left 03/08/2021   Procedure: REVERSE SHOULDER ARTHROPLASTY;  Surgeon: Bjorn Pippin, MD;  Location: [facility]S;  Service: Orthopedics;  Laterality: Left;   RIGHT/LEFT HEART CATH AND CORONARY ANGIOGRAPHY N/A 09/01/2021   Procedure: RIGHT/LEFT HEART CATH AND CORONARY ANGIOGRAPHY;  Surgeon: Corky Crafts, MD;  Location: [facility] INVASIVE CV LAB;  Service: Cardiovascular;  Laterality: N/A;   ROTATOR CUFF REPAIR Right    TEE WITHOUT CARDIOVERSION N/A 09/01/2021   Procedure: TRANSESOPHAGEAL ECHOCARDIOGRAM (TEE);  Surgeon: Wendall Stade, MD;  Location: [facility] ENDOSCOPY;  Service: Cardiovascular;  Laterality: N/A;   TEE WITHOUT CARDIOVERSION N/A 10/04/2021   Procedure: TRANSESOPHAGEAL ECHOCARDIOGRAM (TEE);  Surgeon: Loreli Slot, MD;  Location: [facility] OR;  Service: Open Heart Surgery;  Laterality: N/A;   Social History   Occupational History   Not on file  Tobacco Use   Smoking status: Never    Passive exposure: Never   Smokeless tobacco: Never  Vaping Use   Vaping status: Never Used  Substance and Sexual Activity   Alcohol use: No    Alcohol/week: 0.0 standard drinks of alcohol   Drug use: No   Sexual activity: Yes    Comment: married

## 2023-12-27 ENCOUNTER — Encounter: Payer: Self-pay | Admitting: Radiology

## 2024-01-28 NOTE — Progress Notes (Signed)
 Cardiology Office Note    Date:  02/07/2024   ID:  Adrian Franco, DOB 01-Sep-1945, MRN [id number]   PCP:  Linford Ribas, MD   [facility]  Cardiologist:  Janelle Mediate, MD     History of Present Illness:  Adrian Franco is a 78 y.o. male with a History of Hypertension, Hyperlipidemia, DVT, PE,aortic insufficiency, ascending aneurysm, sleep apnea, arthritis, BPH, back pain and depression,   Patient developed severe AI and underwent CABG x1 and biologic bentall Aortic valve conduit 10/04/21. He had post op Afib converted to NSR with IV Amio. Also AKI and irbesartan  stopped.  March 2023 had fatigue and malaise Amiodarone  d/c and lopressor  decreased Dizziness improved Has had issues with left knee with bloody effusion seeing Dr Marland Silvas   Seen by Dr Alroy Aspen for dyspnea 11/27/22. He is a Marine scientist and farms Chronic edema LLE from prior DVT. Dyspnea with no weight gain Some orthostasis   10/31/22 LE left venous:  chronic DVT left popliteal incompetent common femoral and popliteal vein Had prior scleroRx with Dr Vikki Graves  12/27/22  TTE Normal LV EF 55-60% mild LVH grade 2 diastolic severe bi atrial enlargement mild MR 25 mm Resilia AVR trivial AR mean gradient 10 peak 17.6 mmhg normal function   12/28/22:  Normal myovue on ischemia EF 56%   Fell with left wrist fracture 09/10/23 no surgery just splinted seen by [facility]  Still having LE edema worse on left Venous dx and prior DVT. Discussed stopping norvasc  and increasing lasix  20 mg bid   Past Medical History:  Diagnosis Date   Aneurysm (HCC)    Anxiety    Aortic aneurysm (HCC)    followed by Dr Stann Earnest   Arthritis    Atypical chest pain    BPH (benign prostatic hypertrophy)    Chronic back pain    Depression    DVT (deep venous thrombosis) (HCC)    GERD (gastroesophageal reflux disease)    History of kidney stones    HTN (hypertension)    Hyperlipemia    Hypogonadism in male    Insomnia     Localized edema    PE (pulmonary embolism)    Renal insufficiency    Restless leg syndrome    Sleep apnea    severe OSA-cannot tolerate CPAP   SOB (shortness of breath)    Testicular hypofunction     Past Surgical History:  Procedure Laterality Date   BENTALL PROCEDURE N/A 10/04/2021   Procedure: BENTALL PROCEDURE USING 25 MM KONNECT RESILIA  AORTIC VALVED CONDUIT;  Surgeon: Zelphia Higashi, MD;  Location: [facility] OR;  Service: Open Heart Surgery;  Laterality: N/A;   CORONARY ARTERY BYPASS GRAFT N/A 10/04/2021   Procedure: CORONARY ARTERY BYPASS GRAFTING (CABG) TIMES ONE USING RIGHT GREATER SAPHENOUS VEIN;  Surgeon: Zelphia Higashi, MD;  Location: [facility] OR;  Service: Open Heart Surgery;  Laterality: N/A;   DRUG INDUCED ENDOSCOPY N/A 12/09/2019   Procedure: DRUG INDUCED ENDOSCOPY;  Surgeon: Virgina Grills, MD;  Location: [facility];  Service: ENT;  Laterality: N/A;   ENDOVEIN HARVEST OF GREATER SAPHENOUS VEIN Right 10/04/2021   Procedure: ENDOVEIN HARVEST OF GREATER SAPHENOUS VEIN;  Surgeon: Zelphia Higashi, MD;  Location: [facility] OR;  Service: Open Heart Surgery;  Laterality: Right;   IMPLANTATION OF HYPOGLOSSAL NERVE STIMULATOR Right 02/17/2020   Procedure: IMPLANTATION OF HYPOGLOSSAL NERVE STIMULATOR;  Surgeon: Virgina Grills, MD;  Location: [facility] OR;  Service: ENT;  Laterality:  Right;   INGUINAL HERNIA REPAIR     KIDNEY STONE SURGERY     stent   REVERSE SHOULDER ARTHROPLASTY Left 03/08/2021   Procedure: REVERSE SHOULDER ARTHROPLASTY;  Surgeon: Micheline Ahr, MD;  Location: [facility];  Service: Orthopedics;  Laterality: Left;   RIGHT/LEFT HEART CATH AND CORONARY ANGIOGRAPHY N/A 09/01/2021   Procedure: RIGHT/LEFT HEART CATH AND CORONARY ANGIOGRAPHY;  Surgeon: Lucendia Rusk, MD;  Location: [facility] INVASIVE CV LAB;  Service: Cardiovascular;  Laterality: N/A;   ROTATOR CUFF REPAIR Right    TEE WITHOUT CARDIOVERSION N/A 09/01/2021   Procedure: TRANSESOPHAGEAL ECHOCARDIOGRAM (TEE);   Surgeon: Loyde Rule, MD;  Location: [facility] ENDOSCOPY;  Service: Cardiovascular;  Laterality: N/A;   TEE WITHOUT CARDIOVERSION N/A 10/04/2021   Procedure: TRANSESOPHAGEAL ECHOCARDIOGRAM (TEE);  Surgeon: Zelphia Higashi, MD;  Location: [facility] OR;  Service: Open Heart Surgery;  Laterality: N/A;    Current Medications: Current Meds  Medication Sig   amoxicillin  (AMOXIL ) 500 MG capsule Take 4 capsules (2,000mg ) by mouth 30-60 minutes before dental exams/procedures   Ascorbic Acid (VITAMIN C) 1000 MG tablet Take 1,000 mg by mouth in the morning.   aspirin  EC 81 MG EC tablet Take 1 tablet (81 mg total) by mouth daily. Swallow whole.   Cholecalciferol (VITAMIN D) 50 MCG (2000 UT) tablet Take 2,000 Units by mouth daily.   Collagen-Vitamin C (SUPER COLLAGEN PLUS VITAMIN C PO) Take 1 tablet by mouth daily.   Cyanocobalamin (B-12) 5000 MCG CAPS Take 5,000 mcg by mouth daily.   docusate sodium  (COLACE) 100 MG capsule Take 100 mg by mouth daily as needed for moderate constipation.   HYDROcodone -acetaminophen  (NORCO/VICODIN) 5-325 MG tablet Take 1-2 tablets by mouth every 6 (six) hours as needed for moderate pain (pain score 4-6).   LORazepam  (ATIVAN ) 0.5 MG tablet Take 0.5 mg by mouth at bedtime.   LYRICA  75 MG capsule Take 75 mg by mouth 2 (two) times daily.   magnesium  oxide (MAG-OX) 400 MG tablet Take 400 mg by mouth daily.   nitroGLYCERIN  (NITROSTAT ) 0.4 MG SL tablet Place 1 tablet (0.4 mg total) under the tongue every 5 (five) minutes as needed for chest pain.   omeprazole (PRILOSEC) 40 MG capsule Take 40 mg by mouth daily.   oxymetazoline  (AFRIN) 0.05 % nasal spray Place 1 spray into both nostrils at bedtime.   Polyvinyl Alcohol-Povidone (REFRESH OP) Place 1 drop into the left eye daily. Refresh eye drops   rivaroxaban  (XARELTO ) 20 MG TABS tablet Take 1 tablet (20 mg total) by mouth daily with supper.   rOPINIRole  (REQUIP ) 4 MG tablet Take 4 mg by mouth at bedtime.   simvastatin  (ZOCOR ) 40 MG  tablet Take by mouth.   tamsulosin  (FLOMAX ) 0.4 MG CAPS capsule Take 0.4 mg by mouth 2 (two) times daily.   tiZANidine  (ZANAFLEX ) 2 MG tablet Take 2 mg by mouth every 6 (six) hours as needed for muscle spasms.   [DISCONTINUED] amLODipine  (NORVASC ) 2.5 MG tablet Take 2.5 mg by mouth daily.     Allergies:   Sulfa antibiotics and Lotensin [benazepril hcl]   Social History   Socioeconomic History   Marital status: Married    Spouse name: Marily Shows   Number of children: 2   Years of education: Not on file   Highest education level: Associate degree: occupational, Scientist, product/process development, or vocational program  Occupational History   Not on file  Tobacco Use   Smoking status: Never    Passive exposure: Never   Smokeless tobacco: Never  Vaping Use   Vaping status: Never Used  Substance and Sexual Activity   Alcohol use: No    Alcohol/week: 0.0 standard drinks of alcohol   Drug use: No   Sexual activity: Yes    Comment: married  Other Topics Concern   Not on file  Social History Narrative   Lives with wife   R handed   Caffeine: 2 C of coffee, diet sun drop a day   Social Drivers of Corporate investment banker Strain: Not on file  Food Insecurity: Not on file  Transportation Needs: Not on file  Physical Activity: Not on file  Stress: Not on file  Social Connections: Not on file     Family History:  The patient's  family history includes Healthy in his brother, sister, son, and son; Heart attack in his father; Hyperlipidemia in his father and mother; Other in his brother.   ROS:   Please see the history of present illness.    ROS All other systems reviewed and are negative.   PHYSICAL EXAM:   VS:  BP 132/72   Pulse 64   Ht 5' 8 (1.727 m)   Wt 204 lb 6.4 oz (92.7 kg)   SpO2 93%   BMI 31.08 kg/m   Physical Exam   Affect appropriate Healthy:  appears stated age HEENT: normal Neck supple with no adenopathy JVP normal no bruits no thyromegaly Lungs clear with no wheezing and good  diaphragmatic motion Heart:  S1/S2 SEM through AVR post sternotomy Abdomen: benighn, BS positve, no tenderness, no AAA no bruit.  No HSM or HJR LLE chronic phlebitis with plus 1 edema Neuro non-focal Skin warm and dry No muscular weakness   Wt Readings from Last 3 Encounters:  02/07/24 204 lb 6.4 oz (92.7 kg)  10/24/23 206 lb (93.4 kg)  09/26/23 206 lb (93.4 kg)      Studies/Labs Reviewed:   EKG: 11/27/22 SR rate 92 ICRBBB no acute changes   Recent Labs: No results found for requested labs within last 365 days.   Lipid Panel    Component Value Date/Time   CHOL 190 02/08/2022 1127   TRIG 261 (H) 02/08/2022 1127   HDL 55 02/08/2022 1127   CHOLHDL 3.5 02/08/2022 1127   LDLCALC 92 02/08/2022 1127    Additional studies/ records that were reviewed today include:    LLE Venous Duplex 10/31/22 TTE 06/03/23  Myovue 12/28/22     PLAN:  In order of problems listed above:  CABG/AVR:  10/04/21 [place] Biologic Bentall 25 mm Konect Resilia valve conduit with SVG to RCA.  TTE 06/13/23  with normal valve function and Myovue with no ischemia SBE prophylaxis    HTN  .lopressor  dose decreased march 2023 12.5 mg bid improved   HLD on Lipitor LDL 92   DVT & PE on Xarelto  post sclerotherapy Dr Vikki Graves chronic LLE phlebitis   Diastolic Dysfunction:  lasix  added 12/31/22  BNP normal 01/09/23 LE edema from venous dx and prior DVT. D/C norvasc  to see if this contributes Increase lasix  to 20 mg bid BMET today  OSA has inspire device F/U Dr Albertina Hugger  Ortho:  left risk fracture 08/2023 healing will with minimal displacement on xray 10/24/23   BMET Lasix  increase 20 mg bid D/C norvasc    F/U in 4-6  years   Medication Adjustments/Labs and Tests Ordered: Current medicines are reviewed at length with the patient today.  Concerns regarding medicines are outlined above.  Medication changes, Labs and Tests  ordered today are listed in the Patient Instructions below. Patient Instructions  Medication  Instructions:  Your physician has recommended you make the following change in your medication:  1-STOP Amlodipine  2-START furosemide  20 mg by mouth twice daily.  *If you need a refill on your cardiac medications before your next appointment, please call your pharmacy*  Lab Work: Your physician recommends that you return for lab work in: 1 week for BMET  If you have labs (blood work) drawn today and your tests are completely normal, you will receive your results only by: MyChart Message (if you have MyChart) OR A paper copy in the mail If you have any lab test that is abnormal or we need to change your treatment, we will call you to review the results.  Testing/Procedures: None ordered today.  Follow-Up: At [facility], you and your health needs are our priority.  As part of our continuing mission to provide you with exceptional heart care, our providers are all part of one team.  This team includes your primary Cardiologist (physician) and Advanced Practice Providers or APPs (Physician Assistants and Nurse Practitioners) who all work together to provide you with the care you need, when you need it.  Your next appointment:   3 week(s)  Provider:   Janelle Mediate, MD    We recommend signing up for the patient portal called MyChart.  Sign up information is provided on this After Visit Summary.  MyChart is used to connect with patients for Virtual Visits (Telemedicine).  Patients are able to view lab/test results, encounter notes, upcoming appointments, etc.  Non-urgent messages can be sent to your provider as well.   To learn more about what you can do with MyChart, go to [email or web address].           Signed, Janelle Mediate, MD  02/07/2024 4:42 PM    [facility] [address] [place], [place], [place]  [address] Phone: [phone]; Fax: [phone]

## 2024-02-07 ENCOUNTER — Ambulatory Visit: Payer: Medicare Other | Attending: Cardiovascular Disease | Admitting: Cardiovascular Disease

## 2024-02-07 ENCOUNTER — Other Ambulatory Visit: Payer: Self-pay

## 2024-02-07 VITALS — BP 132/72 | HR 64 | Ht 68.0 in | Wt 204.4 lb

## 2024-02-07 DIAGNOSIS — I10 Essential (primary) hypertension: Secondary | ICD-10-CM

## 2024-02-07 DIAGNOSIS — I25810 Atherosclerosis of coronary artery bypass graft(s) without angina pectoris: Secondary | ICD-10-CM | POA: Insufficient documentation

## 2024-02-07 DIAGNOSIS — Z952 Presence of prosthetic heart valve: Secondary | ICD-10-CM | POA: Insufficient documentation

## 2024-02-07 DIAGNOSIS — R600 Localized edema: Secondary | ICD-10-CM | POA: Insufficient documentation

## 2024-02-07 MED ORDER — FUROSEMIDE 20 MG PO TABS: 20.0000 mg | ORAL_TABLET | Freq: Two times a day (BID) | ORAL | 3 refills | Status: DC

## 2024-02-07 NOTE — Patient Instructions (Addendum)
 Medication Instructions:  Your physician has recommended you make the following change in your medication:  1-STOP Amlodipine  2-START furosemide  20 mg by mouth twice daily.  *If you need a refill on your cardiac medications before your next appointment, please call your pharmacy*  Lab Work: Your physician recommends that you return for lab work in: 1 week for BMET  If you have labs (blood work) drawn today and your tests are completely normal, you will receive your results only by: MyChart Message (if you have MyChart) OR A paper copy in the mail If you have any lab test that is abnormal or we need to change your treatment, we will call you to review the results.  Testing/Procedures: None ordered today.  Follow-Up: At [facility], you and your health needs are our priority.  As part of our continuing mission to provide you with exceptional heart care, our providers are all part of one team.  This team includes your primary Cardiologist (physician) and Advanced Practice Providers or APPs (Physician Assistants and Nurse Practitioners) who all work together to provide you with the care you need, when you need it.  Your next appointment:   3 week(s)  Provider:   Janelle Mediate, MD    We recommend signing up for the patient portal called MyChart.  Sign up information is provided on this After Visit Summary.  MyChart is used to connect with patients for Virtual Visits (Telemedicine).  Patients are able to view lab/test results, encounter notes, upcoming appointments, etc.  Non-urgent messages can be sent to your provider as well.   To learn more about what you can do with MyChart, go to [email or web address].

## 2024-02-08 LAB — BASIC METABOLIC PANEL WITH GFR
BUN/Creatinine Ratio: 13 (ref 10–24)
BUN: 18 mg/dL (ref 8–27)
CO2: 22 mmol/L (ref 20–29)
Calcium: 10.2 mg/dL (ref 8.6–10.2)
Chloride: 100 mmol/L (ref 96–106)
Creatinine, Ser: 1.35 mg/dL — ABNORMAL HIGH (ref 0.76–1.27)
Glucose: 97 mg/dL (ref 70–99)
Potassium: 4.5 mmol/L (ref 3.5–5.2)
Sodium: 140 mmol/L (ref 134–144)
eGFR: 54 mL/min/{1.73_m2} — ABNORMAL LOW (ref >59)

## 2024-02-11 ENCOUNTER — Other Ambulatory Visit: Payer: Self-pay

## 2024-02-11 MED ORDER — FUROSEMIDE 20 MG PO TABS: 20.0000 mg | ORAL_TABLET | Freq: Two times a day (BID) | ORAL | 3 refills | Status: AC

## 2024-02-16 ENCOUNTER — Other Ambulatory Visit: Payer: Self-pay | Admitting: Cardiovascular Disease

## 2024-02-27 NOTE — Progress Notes (Signed)
 Cardiology Office Note    Date:  03/04/2024   ID:  Adrian Franco, DOB 1946/06/13, MRN [id number]   PCP:  Adrian Senior, MD   [facility]  Cardiologist:  Adrian Emmer, MD     History of Present Illness:  Adrian Franco is a 78 y.o. male with a History of Hypertension, Hyperlipidemia, DVT, PE,aortic insufficiency, ascending aneurysm, sleep apnea, arthritis, BPH, back pain and depression,   Patient developed severe AI and underwent CABG x1 and biologic bentall Aortic valve conduit 10/04/21. He had post op Afib converted to NSR with IV Amio. Also AKI and irbesartan  stopped.  March 2023 had fatigue and malaise Amiodarone  d/c and lopressor  decreased Dizziness improved Has had issues with left knee with bloody effusion seeing Dr Adrian Franco   Seen by Dr Adrian Franco for dyspnea 11/27/22. He is a Marine scientist and farms Chronic edema LLE from prior DVT. Dyspnea with no weight gain Some orthostasis   10/31/22 LE left venous:  chronic DVT left popliteal incompetent common femoral and popliteal vein Had prior scleroRx with Dr Adrian Franco  12/27/22  TTE Normal LV EF 55-60% mild LVH grade 2 diastolic severe bi atrial enlargement mild MR 25 mm Resilia AVR trivial AR mean gradient 10 peak 17.6 mmhg normal function   12/28/22:  Normal myovue on ischemia EF 56%   Fell with left wrist fracture 09/10/23 no surgery just splinted seen by Oneil Barbarann Beers   Seen June 13,2025 Still having LE edema worse on left Venous dx and prior DVT. Norvasc  d/c and lasix  increased to 20 mg bid At that time K 4.5 Cr 1.35 and BUN 18   Wife Adrian Franco 11/04/49 wishes to be a patient of mine if she ever has a heart problem   Past Medical History:  Diagnosis Date   Aneurysm (HCC)    Anxiety    Aortic aneurysm (HCC)    followed by Dr Franco   Arthritis    Atypical chest pain    BPH (benign prostatic hypertrophy)    Chronic back pain    Depression    DVT (deep venous thrombosis) (HCC)    GERD  (gastroesophageal reflux disease)    History of kidney stones    HTN (hypertension)    Hyperlipemia    Hypogonadism in male    Insomnia    Localized edema    PE (pulmonary embolism)    Renal insufficiency    Restless leg syndrome    Sleep apnea    severe OSA-cannot tolerate CPAP   SOB (shortness of breath)    Testicular hypofunction     Past Surgical History:  Procedure Laterality Date   BENTALL PROCEDURE N/A 10/04/2021   Procedure: BENTALL PROCEDURE USING 25 MM KONNECT RESILIA  AORTIC VALVED CONDUIT;  Surgeon: Kerrin Elspeth BROCKS, MD;  Location: [facility] OR;  Service: Open Heart Surgery;  Laterality: N/A;   CORONARY ARTERY BYPASS GRAFT N/A 10/04/2021   Procedure: CORONARY ARTERY BYPASS GRAFTING (CABG) TIMES ONE USING RIGHT GREATER SAPHENOUS VEIN;  Surgeon: Kerrin Elspeth BROCKS, MD;  Location: [facility] OR;  Service: Open Heart Surgery;  Laterality: N/A;   DRUG INDUCED ENDOSCOPY N/A 12/09/2019   Procedure: DRUG INDUCED ENDOSCOPY;  Surgeon: Carlie Clark, MD;  Location: [facility];  Service: ENT;  Laterality: N/A;   ENDOVEIN HARVEST OF GREATER SAPHENOUS VEIN Right 10/04/2021   Procedure: ENDOVEIN HARVEST OF GREATER SAPHENOUS VEIN;  Surgeon: Kerrin Elspeth BROCKS, MD;  Location: [facility] OR;  Service: Open Heart Surgery;  Laterality: Right;   IMPLANTATION OF HYPOGLOSSAL NERVE STIMULATOR Right 02/17/2020   Procedure: IMPLANTATION OF HYPOGLOSSAL NERVE STIMULATOR;  Surgeon: Carlie Clark, MD;  Location: [facility] OR;  Service: ENT;  Laterality: Right;   INGUINAL HERNIA REPAIR     KIDNEY STONE SURGERY     stent   REVERSE SHOULDER ARTHROPLASTY Left 03/08/2021   Procedure: REVERSE SHOULDER ARTHROPLASTY;  Surgeon: Adrian Bonner DASEN, MD;  Location: [facility] ORS;  Service: Orthopedics;  Laterality: Left;   RIGHT/LEFT HEART CATH AND CORONARY ANGIOGRAPHY N/A 09/01/2021   Procedure: RIGHT/LEFT HEART CATH AND CORONARY ANGIOGRAPHY;  Surgeon: Adrian Candyce RAMAN, MD;  Location: [facility] INVASIVE CV LAB;  Service: Cardiovascular;   Laterality: N/A;   ROTATOR CUFF REPAIR Right    TEE WITHOUT CARDIOVERSION N/A 09/01/2021   Procedure: TRANSESOPHAGEAL ECHOCARDIOGRAM (TEE);  Surgeon: Adrian Adrian BROCKS, MD;  Location: [facility] ENDOSCOPY;  Service: Cardiovascular;  Laterality: N/A;   TEE WITHOUT CARDIOVERSION N/A 10/04/2021   Procedure: TRANSESOPHAGEAL ECHOCARDIOGRAM (TEE);  Surgeon: Kerrin Elspeth BROCKS, MD;  Location: [facility] OR;  Service: Open Heart Surgery;  Laterality: N/A;    Current Medications: Current Meds  Medication Sig   amoxicillin  (AMOXIL ) 500 MG capsule Take 4 capsules (2,000mg ) by mouth 30-60 minutes before dental exams/procedures   Ascorbic Acid (VITAMIN C) 1000 MG tablet Take 1,000 mg by mouth in the morning.   aspirin  EC 81 MG EC tablet Take 1 tablet (81 mg total) by mouth daily. Swallow whole.   atorvastatin  (LIPITOR) 40 MG tablet TAKE 1 TABLET EVERY DAY   Cholecalciferol (VITAMIN D) 50 MCG (2000 UT) tablet Take 2,000 Units by mouth daily.   Collagen-Vitamin C (SUPER COLLAGEN PLUS VITAMIN C PO) Take 1 tablet by mouth daily.   Cyanocobalamin (B-12) 5000 MCG CAPS Take 5,000 mcg by mouth daily.   docusate sodium  (COLACE) 100 MG capsule Take 100 mg by mouth daily as needed for moderate constipation.   furosemide  (LASIX ) 20 MG tablet Take 1 tablet (20 mg total) by mouth 2 (two) times daily.   HYDROcodone -acetaminophen  (NORCO/VICODIN) 5-325 MG tablet Take 1-2 tablets by mouth every 6 (six) hours as needed for moderate pain (pain score 4-6).   LORazepam  (ATIVAN ) 0.5 MG tablet Take 0.5 mg by mouth at bedtime.   LYRICA  75 MG capsule Take 75 mg by mouth 2 (two) times daily.   magnesium  oxide (MAG-OX) 400 MG tablet Take 400 mg by mouth daily.   Multiple Vitamins-Minerals (ONE-A-DAY MENS 50+ ADVANTAGE) TABS Take 1 tablet by mouth daily with breakfast.   nitroGLYCERIN  (NITROSTAT ) 0.4 MG SL tablet Place 1 tablet (0.4 mg total) under the tongue every 5 (five) minutes as needed for chest pain.   omeprazole (PRILOSEC) 40 MG capsule Take  40 mg by mouth daily.   oxymetazoline  (AFRIN) 0.05 % nasal spray Place 1 spray into both nostrils at bedtime.   Polyvinyl Alcohol-Povidone (REFRESH OP) Place 1 drop into the left eye daily. Refresh eye drops   rivaroxaban  (XARELTO ) 20 MG TABS tablet Take 1 tablet (20 mg total) by mouth daily with supper.   rOPINIRole  (REQUIP ) 4 MG tablet Take 4 mg by mouth at bedtime.   simvastatin  (ZOCOR ) 40 MG tablet Take by mouth.   tamsulosin  (FLOMAX ) 0.4 MG CAPS capsule Take 0.4 mg by mouth 2 (two) times daily.   Testosterone Cypionate 200 MG/ML SOLN Inject 200 mg into the muscle every 14 (fourteen) days.    tiZANidine  (ZANAFLEX ) 2 MG tablet Take 2 mg by mouth every 6 (six) hours as needed for muscle spasms.  Allergies:   Sulfa antibiotics and Lotensin [benazepril hcl]   Social History   Socioeconomic History   Marital status: Married    Spouse name: Dagoberto   Number of children: 2   Years of education: Not on file   Highest education level: Associate degree: occupational, Scientist, product/process development, or vocational program  Occupational History   Not on file  Tobacco Use   Smoking status: Never    Passive exposure: Never   Smokeless tobacco: Never  Vaping Use   Vaping status: Never Used  Substance and Sexual Activity   Alcohol use: No    Alcohol/week: 0.0 standard drinks of alcohol   Drug use: No   Sexual activity: Yes    Comment: married  Other Topics Concern   Not on file  Social History Narrative   Lives with wife   R handed   Caffeine: 2 C of coffee, diet sun drop a day   Social Drivers of Corporate investment banker Strain: Not on file  Food Insecurity: Not on file  Transportation Needs: Not on file  Physical Activity: Not on file  Stress: Not on file  Social Connections: Not on file     Family History:  The patient's  family history includes Healthy in his brother, sister, son, and son; Heart attack in his father; Hyperlipidemia in his father and mother; Other in his brother.   ROS:    Please see the history of present illness.    ROS All other systems reviewed and are negative.   PHYSICAL EXAM:   VS:  BP 132/84   Pulse 76   Ht 5' 8.5 (1.74 m)   Wt 200 lb 12.8 oz (91.1 kg)   SpO2 98%   BMI 30.09 kg/m   Physical Exam   Affect appropriate Healthy:  appears stated age HEENT: normal Neck supple with no adenopathy JVP normal no bruits no thyromegaly Lungs clear with no wheezing and good diaphragmatic motion Heart:  S1/S2 SEM through AVR post sternotomy Abdomen: benighn, BS positve, no tenderness, no AAA no bruit.  No HSM or HJR LLE chronic phlebitis with plus 1 edema Neuro non-focal Skin warm and dry No muscular weakness   Wt Readings from Last 3 Encounters:  03/04/24 200 lb 12.8 oz (91.1 kg)  02/07/24 204 lb 6.4 oz (92.7 kg)  10/24/23 206 lb (93.4 kg)      Studies/Labs Reviewed:   EKG: 11/27/22 SR rate 92 ICRBBB no acute changes   Recent Labs: 02/07/2024: BUN 18; Creatinine, Ser 1.35; Potassium 4.5; Sodium 140   Lipid Panel    Component Value Date/Time   CHOL 190 02/08/2022 1127   TRIG 261 (H) 02/08/2022 1127   HDL 55 02/08/2022 1127   CHOLHDL 3.5 02/08/2022 1127   LDLCALC 92 02/08/2022 1127    Additional studies/ records that were reviewed today include:    LLE Venous Duplex 10/31/22 TTE 06/03/23  Myovue 12/28/22     PLAN:  In order of problems listed above:  CABG/AVR:  10/04/21 [place] Biologic Bentall 25 mm Konect Resilia valve conduit with SVG to RCA.  TTE 06/13/23  with normal valve function and Myovue with no ischemia SBE prophylaxis    HTN  .lopressor  dose decreased march 2023 12.5 mg bid improved   HLD on Lipitor LDL 92   DVT & PE on Xarelto  post sclerotherapy Dr Adrian Franco chronic LLE phlebitis   Diastolic Dysfunction:  lasix  added 12/31/22  BNP normal 01/09/23 LE edema from venous dx and prior DVT Norvasc   d/c and lasix  increased to 20 mg bid 02/07/24 improved  OSA has inspire device F/U Dr Chalice  Ortho:  left risk fracture  08/2023 healing will with minimal displacement on xray 10/24/23   BMET / BNP    F/U in a year   Medication Adjustments/Labs and Tests Ordered: Current medicines are reviewed at length with the patient today.  Concerns regarding medicines are outlined above.  Medication changes, Labs and Tests ordered today are listed in the Patient Instructions below. There are no Patient Instructions on file for this visit.    Signed, Adrian Emmer, MD  03/04/2024 2:15 PM    [facility] [address] [place], [place], [place]  [address] Phone: [phone]; Fax: [phone]

## 2024-03-04 ENCOUNTER — Ambulatory Visit: Attending: Cardiovascular Disease | Admitting: Cardiovascular Disease

## 2024-03-04 ENCOUNTER — Other Ambulatory Visit: Payer: Self-pay

## 2024-03-04 VITALS — BP 132/84 | HR 76 | Ht 68.5 in | Wt 200.8 lb

## 2024-03-04 DIAGNOSIS — I10 Essential (primary) hypertension: Secondary | ICD-10-CM | POA: Diagnosis not present

## 2024-03-04 DIAGNOSIS — Z79899 Other long term (current) drug therapy: Secondary | ICD-10-CM | POA: Insufficient documentation

## 2024-03-04 DIAGNOSIS — R0602 Shortness of breath: Secondary | ICD-10-CM | POA: Diagnosis present

## 2024-03-04 DIAGNOSIS — I25810 Atherosclerosis of coronary artery bypass graft(s) without angina pectoris: Secondary | ICD-10-CM | POA: Insufficient documentation

## 2024-03-04 NOTE — Patient Instructions (Addendum)
 Medication Instructions:  Your physician recommends that you continue on your current medications as directed. Please refer to the Current Medication list given to you today.  *If you need a refill on your cardiac medications before your next appointment, please call your pharmacy*  Lab Work: Your physician recommends that you have lab work today-BMET/ BNP  If you have labs (blood work) drawn today and your tests are completely normal, you will receive your results only by: MyChart Message (if you have MyChart) OR A paper copy in the mail If you have any lab test that is abnormal or we need to change your treatment, we will call you to review the results.  Testing/Procedures: None ordered today.  Follow-Up: At [facility], you and your health needs are our priority.  As part of our continuing mission to provide you with exceptional heart care, our providers are all part of one team.  This team includes your primary Cardiologist (physician) and Advanced Practice Providers or APPs (Physician Assistants and Nurse Practitioners) who all work together to provide you with the care you need, when you need it.  Your next appointment:   1 year(s)  Provider:   [name], MD    We recommend signing up for the patient portal called MyChart.  Sign up information is provided on this After Visit Summary.  MyChart is used to connect with patients for Virtual Visits (Telemedicine).  Patients are able to view lab/test results, encounter notes, upcoming appointments, etc.  Non-urgent messages can be sent to your provider as well.   To learn more about what you can do with MyChart, go to [email or web address].

## 2024-03-05 ENCOUNTER — Ambulatory Visit: Payer: Self-pay | Admitting: Cardiovascular Disease

## 2024-03-05 LAB — BASIC METABOLIC PANEL WITH GFR
BUN/Creatinine Ratio: 14 (ref 10–24)
BUN: 15 mg/dL (ref 8–27)
CO2: 24 mmol/L (ref 20–29)
Calcium: 9.9 mg/dL (ref 8.6–10.2)
Chloride: 99 mmol/L (ref 96–106)
Creatinine, Ser: 1.07 mg/dL (ref 0.76–1.27)
Glucose: 82 mg/dL (ref 70–99)
Potassium: 4.9 mmol/L (ref 3.5–5.2)
Sodium: 142 mmol/L (ref 134–144)
eGFR: 71 mL/min/1.73 (ref >59)

## 2024-03-05 LAB — PRO B NATRIURETIC PEPTIDE: NT-Pro BNP: 120 pg/mL (ref 0–486)

## 2024-04-21 ENCOUNTER — Telehealth: Payer: Self-pay | Admitting: Neurology

## 2024-04-21 ENCOUNTER — Encounter: Payer: Self-pay | Admitting: Neurology

## 2024-04-21 ENCOUNTER — Ambulatory Visit (INDEPENDENT_AMBULATORY_CARE_PROVIDER_SITE_OTHER): Payer: Medicare Other | Admitting: Neurology

## 2024-04-21 VITALS — Ht 68.0 in | Wt 204.0 lb

## 2024-04-21 DIAGNOSIS — E0849 Diabetes mellitus due to underlying condition with other diabetic neurological complication: Secondary | ICD-10-CM | POA: Diagnosis not present

## 2024-04-21 DIAGNOSIS — G4733 Obstructive sleep apnea (adult) (pediatric): Secondary | ICD-10-CM | POA: Diagnosis not present

## 2024-04-21 DIAGNOSIS — Z9682 Presence of neurostimulator: Secondary | ICD-10-CM | POA: Diagnosis not present

## 2024-04-21 DIAGNOSIS — R351 Nocturia: Secondary | ICD-10-CM

## 2024-04-21 DIAGNOSIS — N184 Chronic kidney disease, stage 4 (severe): Secondary | ICD-10-CM | POA: Diagnosis not present

## 2024-04-21 MED ORDER — CYCLOBENZAPRINE HCL 5 MG PO TABS: 5.0000 mg | ORAL_TABLET | Freq: Every day | ORAL | 1 refills | Status: AC

## 2024-04-21 NOTE — Patient Instructions (Signed)
Cyclobenzaprine Tablets What is this medication? CYCLOBENZAPRINE (sye kloe BEN za preen) treats muscle spasms. It works by relaxing your muscles, which reduces muscle stiffness. It belongs to a group of medications called muscle relaxants. This medicine may be used for other purposes; ask your health care provider or pharmacist if you have questions. COMMON BRAND NAME(S): Fexmid, Flexeril What should I tell my care team before I take this medication? They need to know if you have any of these conditions: Heart disease, irregular heartbeat, or previous heart attack Liver disease Thyroid problem An unusual or allergic reaction to cyclobenzaprine, tricyclic antidepressants, lactose, other medications, foods, dyes, or preservatives Pregnant or trying to get pregnant Breastfeeding How should I use this medication? Take this medication by mouth with a glass of water. Take it as directed on the prescription label. You can take it with or without food. If it upsets your stomach, take it with food. Do not take it more often than directed. Talk to your care team about the use of this medication in children. Special care may be needed. Overdosage: If you think you have taken too much of this medicine contact a poison control center or emergency room at once. NOTE: This medicine is only for you. Do not share this medicine with others. What if I miss a dose? If you miss a dose, take it as soon as you can. If it is almost time for your next dose, take only that dose. Do not take double or extra doses. What may interact with this medication? Do not take this medication with any of the following: MAOIs, such as Carbex, Eldepryl, Marplan, Nardil, and Parnate Opioid medications for cough Safinamide This medication may also interact with the following: Alcohol Bupropion Antihistamines for allergy, cough, and cold Certain medications for anxiety or sleep Certain medications for bladder problems, such as  oxybutynin, tolterodine Certain medications for depression, such as amitriptyline, fluoxetine, sertraline Certain medications for Parkinson disease, such as benztropine, trihexyphenidyl Certain medications for seizures, such as phenobarbital, primidone Certain medications for stomach problems, such as dicyclomine, hyoscyamine Certain medications for travel sickness, such as scopolamine General anesthetics, such as halothane, isoflurane, methoxyflurane, propofol Ipratropium Local anesthetics, such as lidocaine, pramoxine, tetracaine Medications that relax muscles for surgery Opioid medications for pain Phenothiazines, such as chlorpromazine, mesoridazine, prochlorperazine, thioridazine Verapamil This list may not describe all possible interactions. Give your health care provider a list of all the medicines, herbs, non-prescription drugs, or dietary supplements you use. Also tell them if you smoke, drink alcohol, or use illegal drugs. Some items may interact with your medicine. What should I watch for while using this medication? Visit your care team for regular checks on your progress. Tell your care team if your symptoms do not start to get better or if they get worse. This medication may affect your coordination, reaction time, or judgment. Do not drive or operate machinery until you know how this medication affects you. Sit up or stand slowly to reduce the risk of dizzy or fainting spells. Drinking alcohol with this medication can increase the risk of these side effects. Taking this medication with other substances that cause drowsiness, such as alcohol, benzodiazepines, or other opioids can cause serious side effects. Give your care team a list of all medications you use. They will tell you how much medication to take. Do not take more medication than directed. Call emergency services if you have problems breathing or staying awake. Your mouth may get dry. Chewing sugarless gum or sucking  hard  candy and drinking plenty of water may help. Contact your care team if the problem does not go away or is severe. What side effects may I notice from receiving this medication? Side effects that you should report to your care team as soon as possible: Allergic reactions--skin rash, itching, hives, swelling of the face, lips, tongue, or throat CNS depression--slow or shallow breathing, shortness of breath, feeling faint, dizziness, confusion, trouble staying awake Heart rhythm changes--fast or irregular heartbeat, dizziness, feeling faint or lightheaded, chest pain, trouble breathing Side effects that usually do not require medical attention (report to your care team if they continue or are bothersome): Constipation Dizziness Drowsiness Dry mouth Fatigue Nausea This list may not describe all possible side effects. Call your doctor for medical advice about side effects. You may report side effects to FDA at 1-800-FDA-1088. Where should I keep my medication? Keep out of the reach of children and pets. Store at room temperature between 20 and 25 degrees C (68 and 77 degrees F). Keep container tightly closed. Get rid of any unused medication after the expiration date. To get rid of medications that are no longer needed or have expired: Take the medication to a medication take-back program. Check with your pharmacy or law enforcement to find a location. If you cannot return the medication, check the label or package insert to see if the medication should be thrown out in the garbage or flushed down the toilet. If you are not sure, ask your care team. If it is safe to put it in the trash, empty the medication out of the container. Mix the medication with cat litter, dirt, coffee grounds, or other unwanted substance. Seal the mixture in a bag or container. Put it in the trash. NOTE: This sheet is a summary. It may not cover all possible information. If you have questions about this medicine, talk to your  doctor, pharmacist, or health care provider.  2024 Elsevier/Gold Standard (2022-02-16 00:00:00)

## 2024-04-21 NOTE — Progress Notes (Signed)
 Provider:  Dedra Gores, MD  Primary Care Physician:  Diedra Senior, MD [address] [place] [address]     Referring Provider: Diedra Senior, Md [address] [place],  [place] [address]          Chief Complaint according to patient   Patient presents with:                HISTORY OF PRESENT ILLNESS:  Adrian Franco is a 78 y.o. male patient who is here for revisit 04/21/2024 for INSIRE  RV .    Chief concern according to patient :  none.  I increased to 1.4V from the long time used 1.3 V setting last June and I like it, I couldn't go higher.  This patient uses pauses for more than 2 times each night, fragmented sleep.   We will increase the start time to 45 minutes from 30 minutes.  Pause time is 30 minutes, will be kept there. 9 Hours therapy time reduced to 8 hours.   Supine and lateral sleep- back pain.       Fam Hx : see previous note  Social HX; see previous note       Review of Systems: Out of a complete 14 system review, the patient complains of only the following symptoms, and all other reviewed systems are negative.:   SLEEPINESS ?  How likely are you to doze in the following situations: 0 = not likely, 1 = slight chance, 2 = moderate chance, 3 = high chance  Sitting and Reading? Watching Television? Sitting inactive in a public place (theater or meeting)? Lying down in the afternoon when circumstances permit? Sitting and talking to someone? Sitting quietly after lunch without alcohol? In a car, while stopped for a few minutes in traffic? As a passenger in a car for an hour without a break?  Total = 8/ 24  FSS at NA  Reviewed compliance - excellent daily use, but fragmented sleep.   Wife reports mild or no snoring .        Social History   Socioeconomic History   Marital status: Married    Spouse name: Dagoberto   Number of children: 2   Years of education: Not on file   Highest education level: Associate degree:  occupational, Scientist, product/process development, or vocational program  Occupational History   Not on file  Tobacco Use   Smoking status: Never    Passive exposure: Never   Smokeless tobacco: Never  Vaping Use   Vaping status: Never Used  Substance and Sexual Activity   Alcohol use: No    Alcohol/week: 0.0 standard drinks of alcohol   Drug use: No   Sexual activity: Yes    Comment: married  Other Topics Concern   Not on file  Social History Narrative   Lives with wife   R handed   Caffeine: 2 C of coffee, diet sun drop a day   Social Drivers of Corporate investment banker Strain: Not on file  Food Insecurity: Not on file  Transportation Needs: Not on file  Physical Activity: Not on file  Stress: Not on file  Social Connections: Not on file    Family History  Problem Relation Age of Onset   Hyperlipidemia Mother    Hyperlipidemia Father    Heart attack Father    Healthy Son    Healthy Brother    Other Brother    Healthy Sister  Healthy Son     Past Medical History:  Diagnosis Date   Aneurysm (HCC)    Anxiety    Aortic aneurysm (HCC)    followed by Dr Delford   Arthritis    Atypical chest pain    BPH (benign prostatic hypertrophy)    Chronic back pain    Depression    DVT (deep venous thrombosis) (HCC)    GERD (gastroesophageal reflux disease)    History of kidney stones    HTN (hypertension)    Hyperlipemia    Hypogonadism in male    Insomnia    Localized edema    PE (pulmonary embolism)    Renal insufficiency    Restless leg syndrome    Sleep apnea    severe OSA-cannot tolerate CPAP   SOB (shortness of breath)    Testicular hypofunction     Past Surgical History:  Procedure Laterality Date   BENTALL PROCEDURE N/A 10/04/2021   Procedure: BENTALL PROCEDURE USING 25 MM KONNECT RESILIA  AORTIC VALVED CONDUIT;  Surgeon: Kerrin Elspeth BROCKS, MD;  Location: [facility] OR;  Service: Open Heart Surgery;  Laterality: N/A;   CORONARY ARTERY BYPASS GRAFT N/A 10/04/2021   Procedure:  CORONARY ARTERY BYPASS GRAFTING (CABG) TIMES ONE USING RIGHT GREATER SAPHENOUS VEIN;  Surgeon: Kerrin Elspeth BROCKS, MD;  Location: [facility] OR;  Service: Open Heart Surgery;  Laterality: N/A;   DRUG INDUCED ENDOSCOPY N/A 12/09/2019   Procedure: DRUG INDUCED ENDOSCOPY;  Surgeon: Carlie Clark, MD;  Location: [facility];  Service: ENT;  Laterality: N/A;   ENDOVEIN HARVEST OF GREATER SAPHENOUS VEIN Right 10/04/2021   Procedure: ENDOVEIN HARVEST OF GREATER SAPHENOUS VEIN;  Surgeon: Kerrin Elspeth BROCKS, MD;  Location: [facility] OR;  Service: Open Heart Surgery;  Laterality: Right;   IMPLANTATION OF HYPOGLOSSAL NERVE STIMULATOR Right 02/17/2020   Procedure: IMPLANTATION OF HYPOGLOSSAL NERVE STIMULATOR;  Surgeon: Carlie Clark, MD;  Location: [facility] OR;  Service: ENT;  Laterality: Right;   INGUINAL HERNIA REPAIR     KIDNEY STONE SURGERY     stent   REVERSE SHOULDER ARTHROPLASTY Left 03/08/2021   Procedure: REVERSE SHOULDER ARTHROPLASTY;  Surgeon: Cristy Bonner DASEN, MD;  Location: [facility] ORS;  Service: Orthopedics;  Laterality: Left;   RIGHT/LEFT HEART CATH AND CORONARY ANGIOGRAPHY N/A 09/01/2021   Procedure: RIGHT/LEFT HEART CATH AND CORONARY ANGIOGRAPHY;  Surgeon: Dann Candyce RAMAN, MD;  Location: [facility] INVASIVE CV LAB;  Service: Cardiovascular;  Laterality: N/A;   ROTATOR CUFF REPAIR Right    TEE WITHOUT CARDIOVERSION N/A 09/01/2021   Procedure: TRANSESOPHAGEAL ECHOCARDIOGRAM (TEE);  Surgeon: Delford Maude BROCKS, MD;  Location: [facility] ENDOSCOPY;  Service: Cardiovascular;  Laterality: N/A;   TEE WITHOUT CARDIOVERSION N/A 10/04/2021   Procedure: TRANSESOPHAGEAL ECHOCARDIOGRAM (TEE);  Surgeon: Kerrin Elspeth BROCKS, MD;  Location: [facility] OR;  Service: Open Heart Surgery;  Laterality: N/A;     Current Outpatient Medications on File Prior to Visit  Medication Sig Dispense Refill   amoxicillin  (AMOXIL ) 500 MG capsule Take 4 capsules (2,000mg ) by mouth 30-60 minutes before dental exams/procedures 4 capsule 6   Ascorbic Acid (VITAMIN  C) 1000 MG tablet Take 1,000 mg by mouth in the morning.     aspirin  EC 81 MG EC tablet Take 1 tablet (81 mg total) by mouth daily. Swallow whole. 30 tablet 11   atorvastatin  (LIPITOR) 40 MG tablet TAKE 1 TABLET EVERY DAY 90 tablet 3   Cholecalciferol (VITAMIN D) 50 MCG (2000 UT) tablet Take 2,000 Units by mouth daily.  Collagen-Vitamin C (SUPER COLLAGEN PLUS VITAMIN C PO) Take 1 tablet by mouth daily.     Cyanocobalamin (B-12) 5000 MCG CAPS Take 5,000 mcg by mouth daily.     docusate sodium  (COLACE) 100 MG capsule Take 100 mg by mouth daily as needed for moderate constipation.     furosemide  (LASIX ) 20 MG tablet Take 1 tablet (20 mg total) by mouth 2 (two) times daily. 180 tablet 3   LORazepam  (ATIVAN ) 0.5 MG tablet Take 0.5 mg by mouth at bedtime.     LYRICA  75 MG capsule Take 75 mg by mouth 2 (two) times daily.     magnesium  oxide (MAG-OX) 400 MG tablet Take 400 mg by mouth daily.     Multiple Vitamins-Minerals (ONE-A-DAY MENS 50+ ADVANTAGE) TABS Take 1 tablet by mouth daily with breakfast.     nitroGLYCERIN  (NITROSTAT ) 0.4 MG SL tablet Place 1 tablet (0.4 mg total) under the tongue every 5 (five) minutes as needed for chest pain. 25 tablet 3   omeprazole (PRILOSEC) 40 MG capsule Take 40 mg by mouth daily.     oxymetazoline  (AFRIN) 0.05 % nasal spray Place 1 spray into both nostrils at bedtime.     Polyvinyl Alcohol-Povidone (REFRESH OP) Place 1 drop into the left eye daily. Refresh eye drops     rivaroxaban  (XARELTO ) 20 MG TABS tablet Take 1 tablet (20 mg total) by mouth daily with supper. 90 tablet 3   rOPINIRole  (REQUIP ) 4 MG tablet Take 4 mg by mouth at bedtime.     simvastatin  (ZOCOR ) 40 MG tablet Take by mouth.     tamsulosin  (FLOMAX ) 0.4 MG CAPS capsule Take 0.4 mg by mouth 2 (two) times daily.     Testosterone Cypionate 200 MG/ML SOLN Inject 200 mg into the muscle every 14 (fourteen) days.      tiZANidine  (ZANAFLEX ) 2 MG tablet Take 2 mg by mouth every 6 (six) hours as needed for  muscle spasms.     No current facility-administered medications on file prior to visit.    Allergies  Allergen Reactions   Sulfa Antibiotics Itching   Lotensin [Benazepril Hcl] Hives and Rash     DIAGNOSTIC DATA (LABS, IMAGING, TESTING) - I reviewed patient records, labs, notes, testing and imaging myself where available.  Lab Results  Component Value Date   WBC 6.2 11/27/2022   HGB 14.7 11/27/2022   HCT 44.6 11/27/2022   MCV 89 11/27/2022   PLT 199 11/27/2022      Component Value Date/Time   NA 142 03/04/2024 1456   K 4.9 03/04/2024 1456   CL 99 03/04/2024 1456   CO2 24 03/04/2024 1456   GLUCOSE 82 03/04/2024 1456   GLUCOSE 118 (H) 10/10/2021 0220   BUN 15 03/04/2024 1456   CREATININE 1.07 03/04/2024 1456   CALCIUM  9.9 03/04/2024 1456   PROT 6.3 03/28/2022 1302   ALBUMIN  4.1 03/28/2022 1302   AST 14 03/28/2022 1302   ALT 9 03/28/2022 1302   ALKPHOS 26 (L) 03/28/2022 1302   BILITOT 0.4 03/28/2022 1302   GFRNONAA >60 10/10/2021 0220   GFRAA 53 (L) 10/20/2020 1156   Lab Results  Component Value Date   CHOL 190 02/08/2022   HDL 55 02/08/2022   LDLCALC 92 02/08/2022   TRIG 261 (H) 02/08/2022   CHOLHDL 3.5 02/08/2022   Lab Results  Component Value Date   HGBA1C 5.4 10/02/2021   No results found for: VITAMINB12 Lab Results  Component Value Date   TSH 1.310 11/27/2022  PHYSICAL EXAM:  There were no vitals filed for this visit. No data found. Body mass index is 31.02 kg/m.   Wt Readings from Last 3 Encounters:  04/21/24 204 lb (92.5 kg)  03/04/24 200 lb 12.8 oz (91.1 kg)  02/07/24 204 lb 6.4 oz (92.7 kg)     Ht Readings from Last 3 Encounters:  04/21/24 5' 8 (1.727 m)  03/04/24 5' 8.5 (1.74 m)  02/07/24 5' 8 (1.727 m)      General: The patient is awake, alert and appears not in acute distress and groomed. Head: Normocephalic, atraumatic.  Neck is supple.    NEUROLOGIC EXAM: The patient is awake and alert, oriented to place and time.    Memory subjective described as intact.  Attention span & concentration ability appears normal.   Speech is fluent,  without  dysarthria, dysphonia or aphasia.  Mood and affect are appropriate.   Neurological Examination: Mental Status: Intact. Language and speech are normal. No cognitive deficits. Cranial Nerves II-XII: Intact. PERL.   No facial droop.  No ptosis.  Hearing is grossly intact bilaterally.  The tongue is normal/  midline.   ASSESSMENT AND PLAN :   78 y.o. year old male  here with:  Inspire user, fragmented sleep for various reasons : on  1.4 V       1) Nocturia on Lasix ,     2) Back pain: daytime sleepiness with Flexaril- suggested to move to night time.    3) Requip  for RLS.    Adjustment to 3 settings and functions today.   Refilled Flexaril for him today,  5 mg qHs po , 90 days - mail order  RV in 12months.      I would like to thank: Diedra Senior, Md [address] [place],  [place] [address] for allowing me to meet with this pleasant patient.   Sleep Clinic Patients are generally offered input on sleep hygiene, life style changes and how to improve compliance with medical treatment where applicable. Review and reiteration of good sleep hygiene measures is offered to any sleep clinic patient, be it in the first consultation or with any follow up visits  Any patient with sleepiness should be cautioned not to drive, work at heights, or operate dangerous or heavy equipment when feeling tired or sleepy.   The patient will be seen in follow-up in the sleep clinic at [facility] for discussion of test results, sleep related symptoms and treatment compliance review, further management strategies, etc.   The patient's condition requires frequent monitoring and adjustments in the treatment plan, reflecting the ongoing complexity of care.  This provider is the continuing focal point for all needed services for this condition.  After spending a total time of  30 minutes  .    minutes face to face and time for  history taking, physical and neurologic examination, review of laboratory studies,  personal review of imaging studies, reports and results of other testing and review of referral information / records as far as provided in visit,   Electronically signed by: Dedra Gores, MD 04/21/2024 10:23 AM  [facility] and [facility] Sleep Board certified by The [employer] of Sleep Medicine and Diplomate of the [employer] of Sleep Medicine. Board certified In Neurology through the ABPN, Fellow of the [employer] of Neu[employer].

## 2024-04-21 NOTE — Telephone Encounter (Signed)
 Patient was seen today for a inpsire follow up. No changes were bad. [place] the inspire was here and a one year follow up was made for 04/21/25.

## 2024-04-29 IMAGING — XA DG FLUORO GUIDE NDL PLC/BX
2 series · 2 of 2 positions shown · non-contrast
Comparison: none

CLINICAL DATA: Left knee pain, unspecified chronicity

[Series 2: standard · 1 of 1 slices shown (1 of 2)]
[im 1/1]
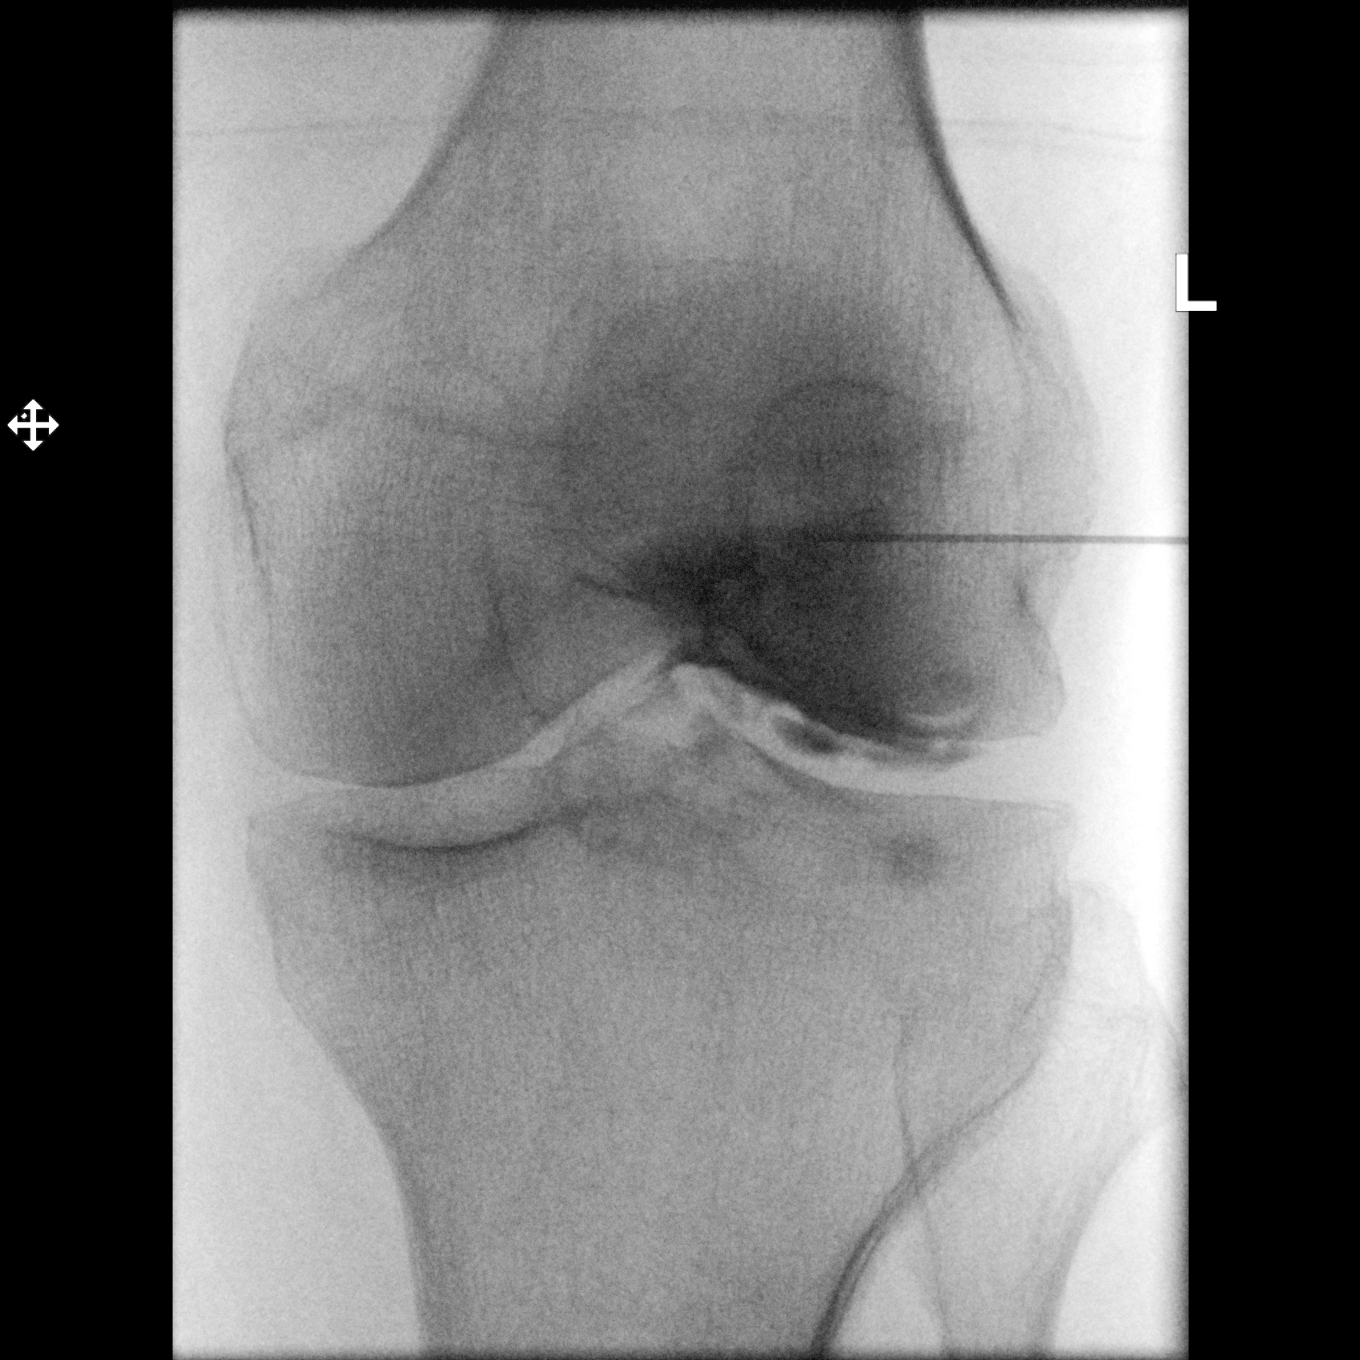

[Series 3: standard · 1 of 1 slices shown (2 of 2)]
[im 1/1]
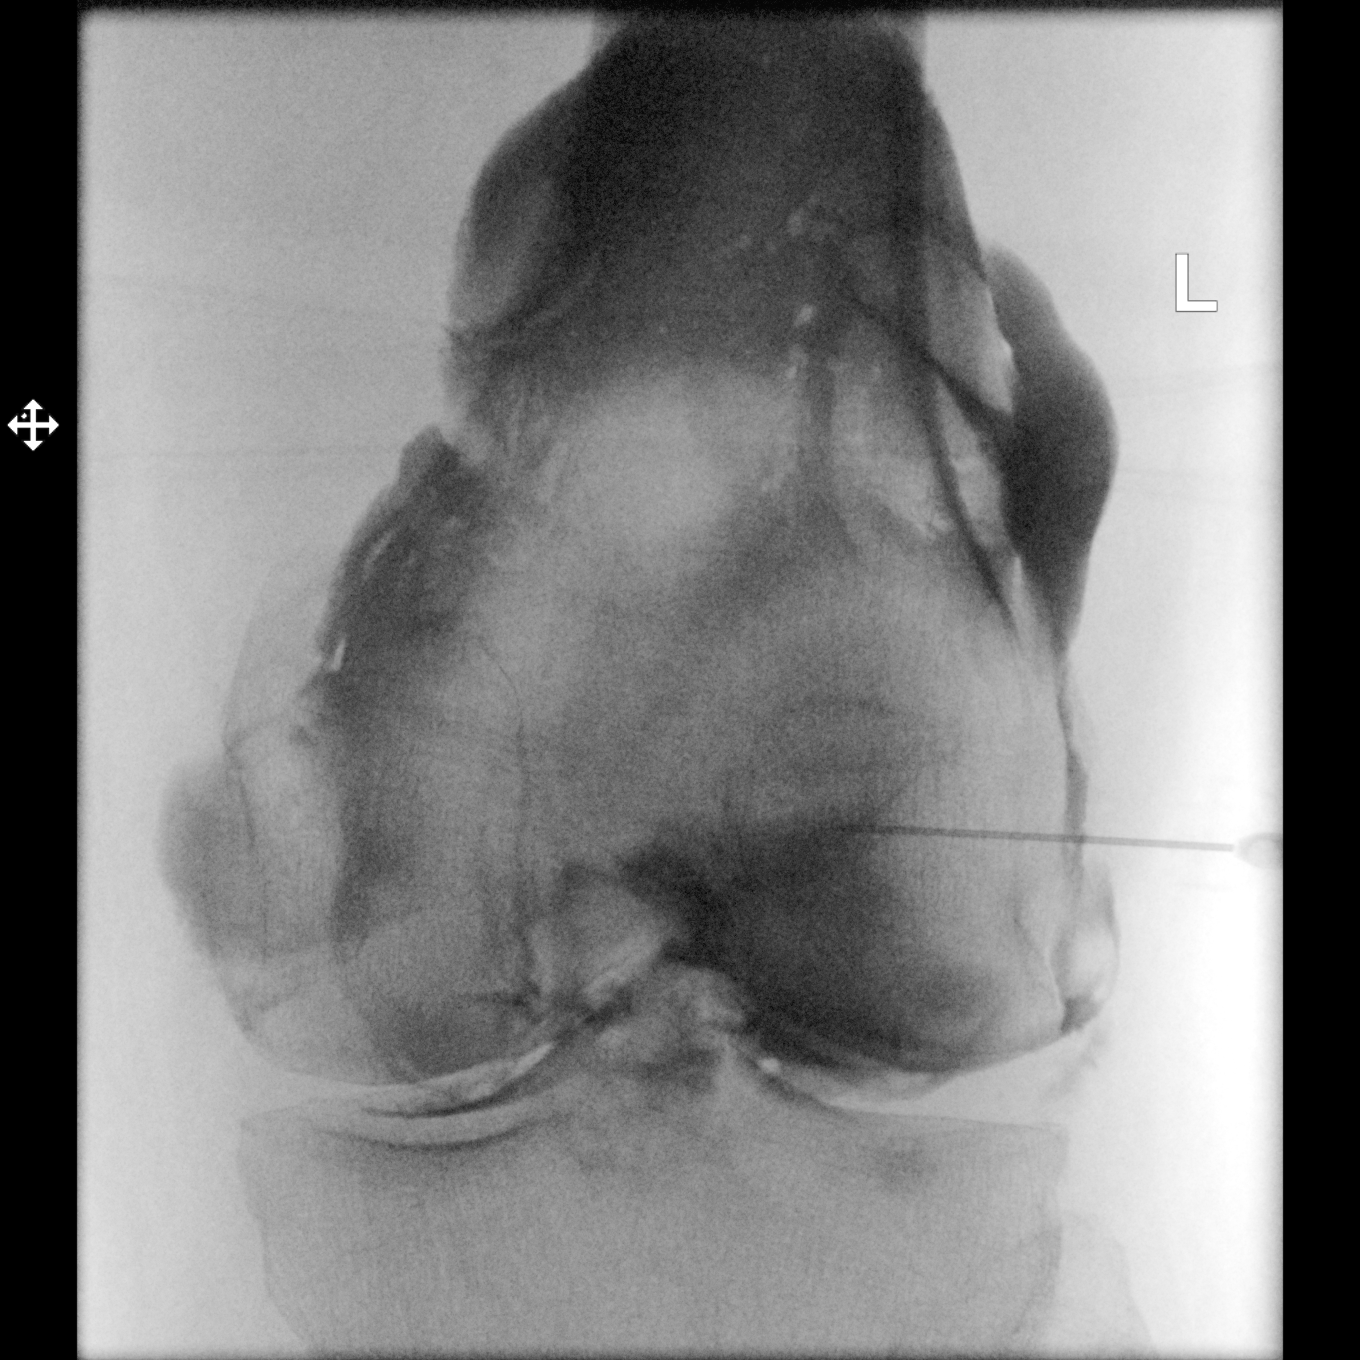

[2 of 2 positions shown; findings below may reference images not displayed]

EXAM:
Left knee injection using fluoroscopic guidance for CT arthrogram

FLUOROSCOPY:
Radiation Exposure Index (as provided by the fluoroscopic device):
0.6 minutes (0.5 mGy)

PROCEDURE:
Overlying skin prepped with Betadine, draped in the usual sterile
fashion, and infiltrated locally with Lidocaine. A 21 gauge needle
advanced into the knee joint using a lateral approach. 1 ml of
Lidocaine injected easily. Approximately 40 mL of iodinated contrast
material was then used to opacify the left knee joint. No immediate
complication.
IMPRESSION: Technically successful left knee injection using fluoroscopic
guidance for CT arthrogram.

## 2024-04-29 IMAGING — CT CT KNEE*L* W/CM
1 series · 12 of 14 positions shown, 15 images · non-contrast
Comparison: None Available.

CLINICAL DATA: Left knee arthrogram

EXAM:
CT ARTHROGRAPHY OF THE LEFT KNEE
TECHNIQUE: Multidetector CT imaging was performed following the standard
protocol after injection of dilute contrast into the joint.
RADIATION DOSE REDUCTION: This exam was performed according to the
departmental dose-optimization program which includes automated
exposure control, adjustment of the mA and/or kV according to
patient size and/or use of iterative reconstruction technique.

[Series 4: lower ext 1.5 st · axial · 0.37mm/px · z∈[-252,-88]mm · 12 of 129 slices shown, 15 images]
[im 10/129  soft-tissue]
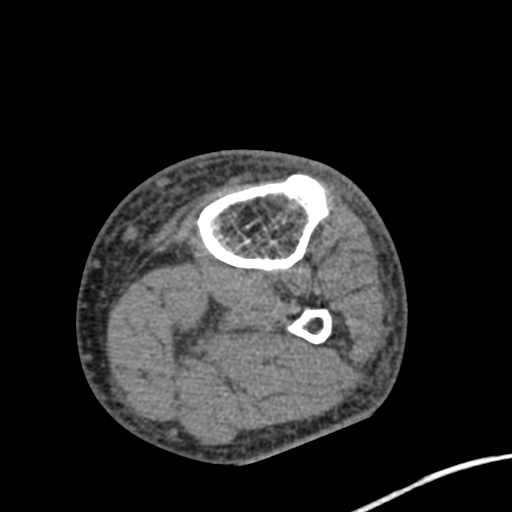
[im 10/129  bone]
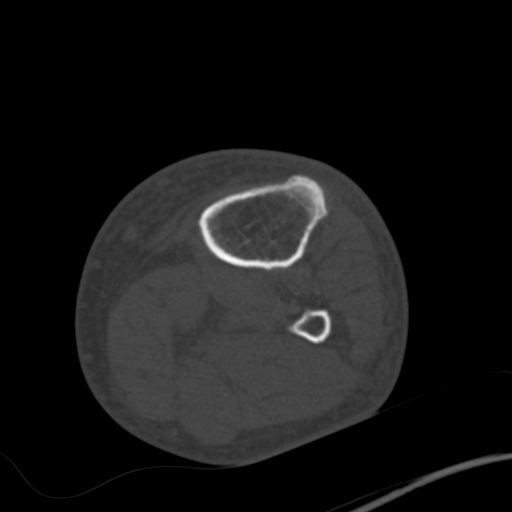
[im 20/129  bone]
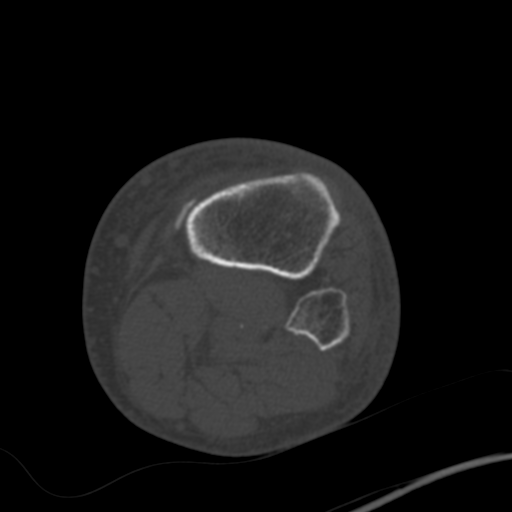
[im 30/129  bone]
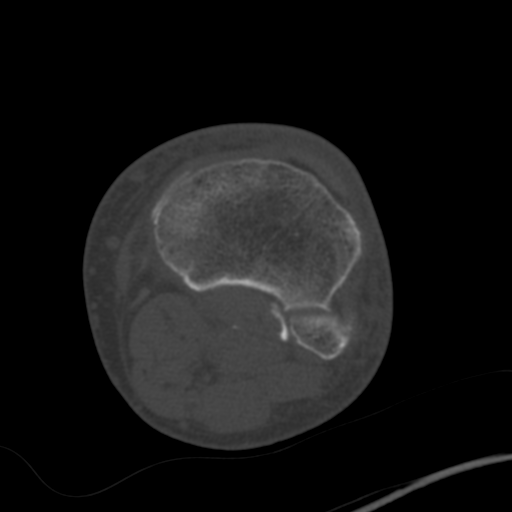
[im 40/129  bone]
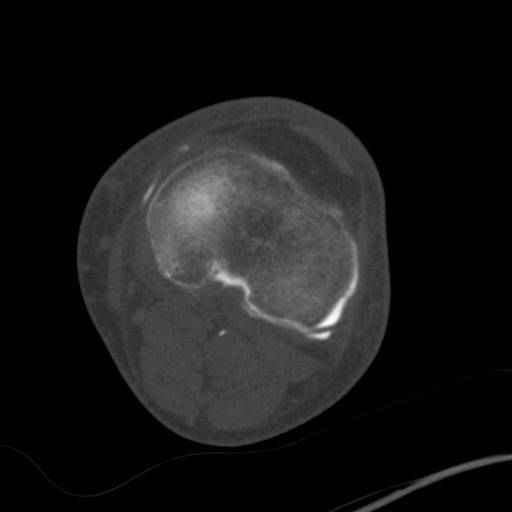
[im 50/129  soft-tissue]
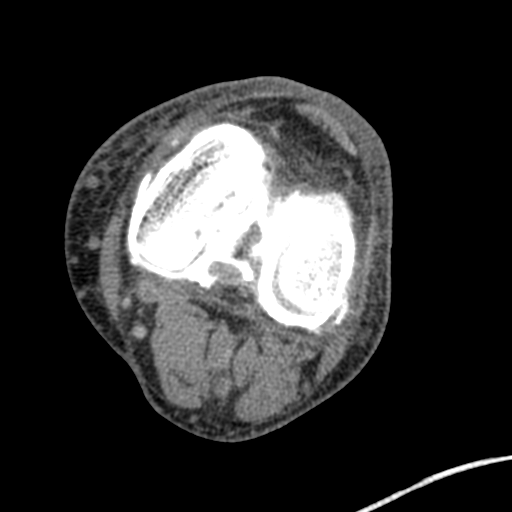
[im 50/129  bone]
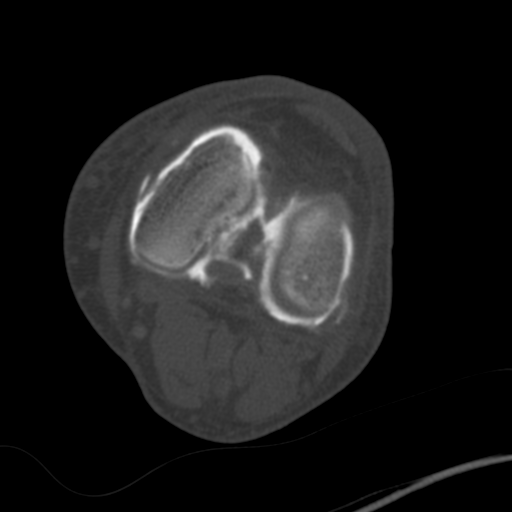
[im 60/129  bone]
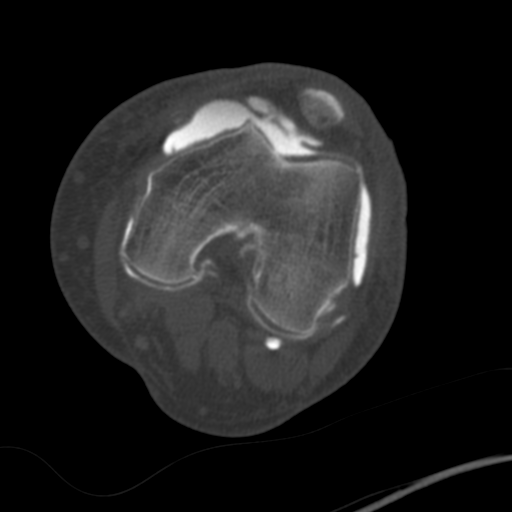
[im 69/129  bone]
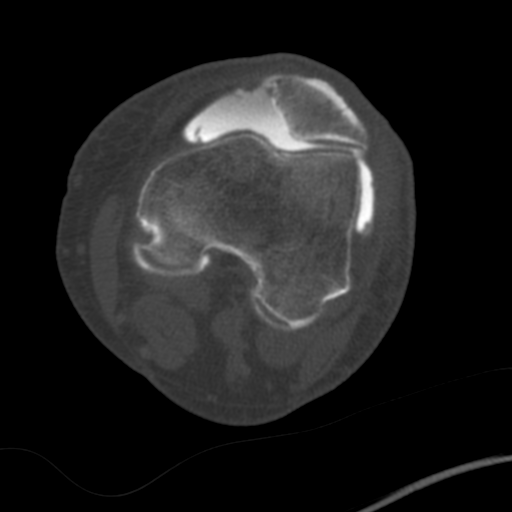
[im 79/129  bone]
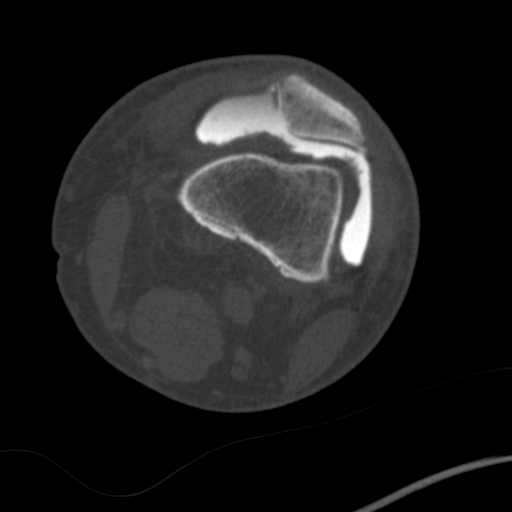
[im 89/129  soft-tissue]
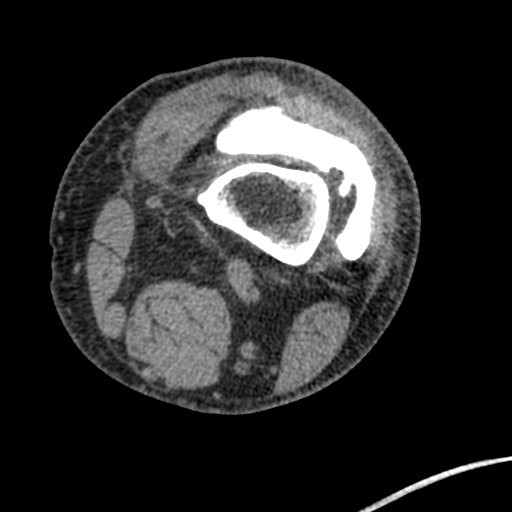
[im 89/129  bone]
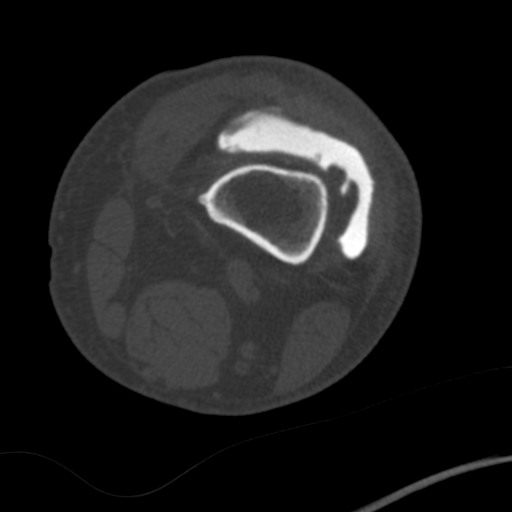
[im 99/129  bone]
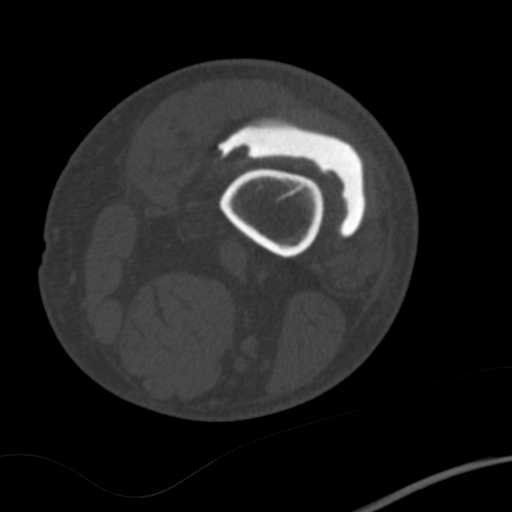
[im 109/129  bone]
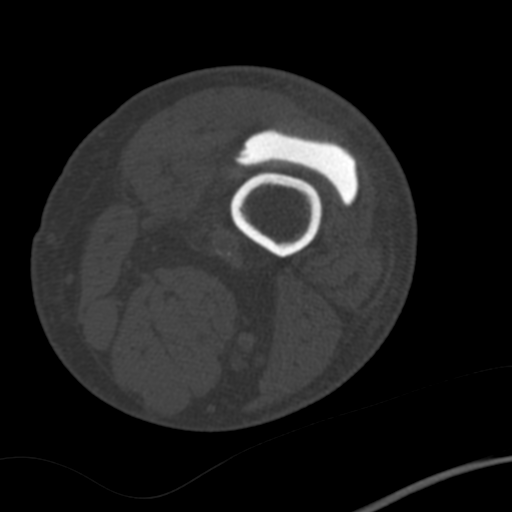
[im 119/129  bone]
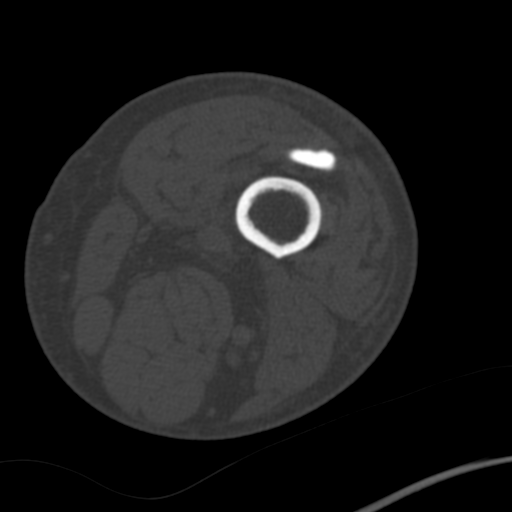

[12 of 14 positions shown; findings below may reference images not displayed]

FINDINGS: MENISCI

Medial: There is a small nondisplaced oblique radial tear of the
posterior horn of the medial meniscus (series 7, image 70, series 3,
images 86-87).

Lateral: Intact.

LIGAMENTS

Cruciates: No evidence of full-thickness ACL or PCL tear.

Collaterals: Poorly assessed by CT, however visualized portions
appear taut.

CARTILAGE

Patellofemoral: There is full-thickness cartilage loss along the
median patellar ridge and medial patellar facet. There is
full-thickness cartilage loss along the inferior medial trochlea.

Medial: Mild partial-thickness cartilage loss along the anterior
weight-bearing medial femoral condyle.

Lateral:  No focal chondral defect.

JOINT: Adequate joint distension by arthrogram technique.

POPLITEAL FOSSA: No Baker's cyst.

EXTENSOR MECHANISM: Intact quadriceps tendon. Intact patellar
tendon.

BONES: There is tricompartment osteophyte formation with areas of
subchondral cystic change and sclerosis consistent with
osteoarthritis. There is no acute fracture. There is no aggressive
osseous lesion. There is focal mild benign-appearing periosteal
thickening along the medial aspect of the distal femoral metaphysis.

Other: There is generalized soft tissue swelling along the knee.
There is contrast filling pericapsular cystic lesions along the
posterolateral joint capsule, likely ganglion cyst.
IMPRESSION: Small, nondisplaced oblique radial tear of the posterior horn of the
medial meniscus.

Tricompartment osteoarthritis, severe in the patellofemoral
compartment, cartilage abnormalities as described above.

## 2024-06-11 ENCOUNTER — Other Ambulatory Visit (HOSPITAL_COMMUNITY): Payer: Self-pay | Admitting: Neurological Surgery

## 2024-06-11 DIAGNOSIS — M5416 Radiculopathy, lumbar region: Secondary | ICD-10-CM

## 2024-06-25 ENCOUNTER — Ambulatory Visit (HOSPITAL_COMMUNITY)
Admission: RE | Admit: 2024-06-25 | Discharge: 2024-06-25 | Disposition: A | Discharge status: Home / Self Care | Source: Ambulatory Visit | Attending: Neurological Surgery | Admitting: Neurological Surgery

## 2024-06-25 DIAGNOSIS — M51369 Other intervertebral disc degeneration, lumbar region without mention of lumbar back pain or lower extremity pain: Secondary | ICD-10-CM | POA: Diagnosis not present

## 2024-06-25 DIAGNOSIS — M51379 Other intervertebral disc degeneration, lumbosacral region without mention of lumbar back pain or lower extremity pain: Secondary | ICD-10-CM

## 2024-06-25 DIAGNOSIS — Z981 Arthrodesis status: Secondary | ICD-10-CM

## 2024-06-25 DIAGNOSIS — M5416 Radiculopathy, lumbar region: Secondary | ICD-10-CM | POA: Insufficient documentation

## 2024-06-29 ENCOUNTER — Encounter: Payer: Self-pay | Admitting: Radiology

## 2024-09-03 ENCOUNTER — Other Ambulatory Visit: Payer: Self-pay | Admitting: Cardiovascular Disease

## 2024-09-03 MED ORDER — AMOXICILLIN 500 MG PO CAPS: ORAL_CAPSULE | ORAL | 6 refills | Status: AC

## 2025-04-21 ENCOUNTER — Ambulatory Visit: Admitting: Neurology
